# Patient Record
Sex: Male | Born: 1935 | Race: Black or African American | Hispanic: No | Marital: Married | Smoking: Former smoker
Health system: Southern US, Community
[De-identification: ages and names within clinical notes are randomized; demographics above are authoritative.]

## PROBLEM LIST (undated history)

## (undated) DIAGNOSIS — I2510 Atherosclerotic heart disease of native coronary artery without angina pectoris: Secondary | ICD-10-CM

## (undated) DIAGNOSIS — N186 End stage renal disease: Secondary | ICD-10-CM

## (undated) DIAGNOSIS — Z992 Dependence on renal dialysis: Secondary | ICD-10-CM

## (undated) DIAGNOSIS — K219 Gastro-esophageal reflux disease without esophagitis: Secondary | ICD-10-CM

## (undated) DIAGNOSIS — I10 Essential (primary) hypertension: Secondary | ICD-10-CM

## (undated) DIAGNOSIS — E119 Type 2 diabetes mellitus without complications: Secondary | ICD-10-CM

## (undated) DIAGNOSIS — I739 Peripheral vascular disease, unspecified: Secondary | ICD-10-CM

## (undated) DIAGNOSIS — I2129 ST elevation (STEMI) myocardial infarction involving other sites: Secondary | ICD-10-CM

## (undated) HISTORY — PX: OTHER SURGICAL HISTORY: SHX169

---

## 2007-04-14 ENCOUNTER — Ambulatory Visit: Payer: Self-pay | Admitting: Nephrology

## 2007-04-17 ENCOUNTER — Ambulatory Visit: Payer: Self-pay | Admitting: Nephrology

## 2007-08-22 ENCOUNTER — Ambulatory Visit: Payer: Self-pay

## 2008-05-10 ENCOUNTER — Emergency Department: Payer: Self-pay | Admitting: Emergency Medicine

## 2008-05-10 ENCOUNTER — Other Ambulatory Visit: Payer: Self-pay

## 2008-07-05 ENCOUNTER — Emergency Department: Payer: Self-pay | Admitting: Emergency Medicine

## 2008-12-17 ENCOUNTER — Emergency Department: Payer: Self-pay | Admitting: Emergency Medicine

## 2009-08-04 ENCOUNTER — Ambulatory Visit: Payer: Self-pay | Admitting: Otolaryngology

## 2010-01-22 ENCOUNTER — Ambulatory Visit: Payer: Self-pay | Admitting: Vascular Surgery

## 2010-02-03 ENCOUNTER — Ambulatory Visit: Payer: Self-pay | Admitting: Vascular Surgery

## 2010-03-19 ENCOUNTER — Ambulatory Visit: Payer: Self-pay | Admitting: Vascular Surgery

## 2010-06-04 ENCOUNTER — Ambulatory Visit: Payer: Self-pay | Admitting: Vascular Surgery

## 2010-07-28 ENCOUNTER — Ambulatory Visit: Payer: Self-pay | Admitting: Vascular Surgery

## 2010-08-31 ENCOUNTER — Emergency Department: Payer: Self-pay | Admitting: Emergency Medicine

## 2010-09-15 ENCOUNTER — Ambulatory Visit: Payer: Self-pay | Admitting: Vascular Surgery

## 2010-10-13 ENCOUNTER — Ambulatory Visit: Payer: Self-pay | Admitting: Vascular Surgery

## 2011-01-26 ENCOUNTER — Ambulatory Visit: Payer: Self-pay | Admitting: Vascular Surgery

## 2011-02-09 ENCOUNTER — Ambulatory Visit: Payer: Self-pay | Admitting: Vascular Surgery

## 2011-04-07 ENCOUNTER — Emergency Department: Payer: Self-pay | Admitting: Emergency Medicine

## 2011-04-13 ENCOUNTER — Emergency Department: Payer: Self-pay | Admitting: Emergency Medicine

## 2011-05-08 ENCOUNTER — Inpatient Hospital Stay: Payer: Self-pay | Admitting: Internal Medicine

## 2011-05-28 ENCOUNTER — Emergency Department: Payer: Self-pay | Admitting: *Deleted

## 2011-09-03 ENCOUNTER — Emergency Department: Payer: Self-pay | Admitting: *Deleted

## 2011-09-03 LAB — COMPREHENSIVE METABOLIC PANEL
Albumin: 3 g/dL — ABNORMAL LOW (ref 3.4–5.0)
Alkaline Phosphatase: 95 U/L (ref 50–136)
Anion Gap: 8 (ref 7–16)
Bilirubin,Total: 0.3 mg/dL (ref 0.2–1.0)
Calcium, Total: 8.9 mg/dL (ref 8.5–10.1)
Chloride: 98 mmol/L (ref 98–107)
Co2: 33 mmol/L — ABNORMAL HIGH (ref 21–32)
Creatinine: 4.71 mg/dL — ABNORMAL HIGH (ref 0.60–1.30)
EGFR (African American): 16 — ABNORMAL LOW
Glucose: 98 mg/dL (ref 65–99)
Osmolality: 279 (ref 275–301)
Potassium: 4 mmol/L (ref 3.5–5.1)
SGOT(AST): 18 U/L (ref 15–37)
Sodium: 139 mmol/L (ref 136–145)

## 2011-09-03 LAB — CBC WITH DIFFERENTIAL/PLATELET
Basophil #: 0 10*3/uL (ref 0.0–0.1)
Eosinophil #: 0.5 10*3/uL (ref 0.0–0.7)
Lymphocyte #: 0.9 10*3/uL — ABNORMAL LOW (ref 1.0–3.6)
Lymphocyte %: 13.8 %
MCH: 30.1 pg (ref 26.0–34.0)
MCHC: 32.1 g/dL (ref 32.0–36.0)
MCV: 94 fL (ref 80–100)
Monocyte #: 1.1 10*3/uL — ABNORMAL HIGH (ref 0.0–0.7)
Neutrophil #: 4.1 10*3/uL (ref 1.4–6.5)
Neutrophil %: 62.1 %
Platelet: 182 10*3/uL (ref 150–440)
RBC: 3.34 10*6/uL — ABNORMAL LOW (ref 4.40–5.90)
RDW: 16.1 % — ABNORMAL HIGH (ref 11.5–14.5)

## 2011-10-25 ENCOUNTER — Emergency Department: Payer: Self-pay | Admitting: Emergency Medicine

## 2011-10-25 LAB — CBC
HCT: 40.1 % (ref 40.0–52.0)
HGB: 12.7 g/dL — ABNORMAL LOW (ref 13.0–18.0)
MCHC: 31.7 g/dL — ABNORMAL LOW (ref 32.0–36.0)
MCV: 93 fL (ref 80–100)
RBC: 4.33 10*6/uL — ABNORMAL LOW (ref 4.40–5.90)
WBC: 5 10*3/uL (ref 3.8–10.6)

## 2011-10-25 LAB — CK TOTAL AND CKMB (NOT AT ARMC)
CK, Total: 207 U/L (ref 35–232)
CK-MB: 1.5 ng/mL (ref 0.5–3.6)

## 2011-10-25 LAB — BASIC METABOLIC PANEL
Calcium, Total: 9.4 mg/dL (ref 8.5–10.1)
Chloride: 98 mmol/L (ref 98–107)
Co2: 36 mmol/L — ABNORMAL HIGH (ref 21–32)
EGFR (African American): 12 — ABNORMAL LOW
Osmolality: 282 (ref 275–301)
Potassium: 4.4 mmol/L (ref 3.5–5.1)
Sodium: 140 mmol/L (ref 136–145)

## 2011-10-25 LAB — TROPONIN I: Troponin-I: 0.09 ng/mL — ABNORMAL HIGH

## 2011-10-28 LAB — CULTURE, BLOOD (SINGLE)

## 2011-11-16 ENCOUNTER — Ambulatory Visit: Payer: Self-pay | Admitting: Vascular Surgery

## 2011-11-16 LAB — POTASSIUM: Potassium: 4.2 mmol/L (ref 3.5–5.1)

## 2012-02-16 ENCOUNTER — Ambulatory Visit: Payer: Self-pay | Admitting: Ophthalmology

## 2012-02-16 DIAGNOSIS — I10 Essential (primary) hypertension: Secondary | ICD-10-CM

## 2012-02-16 LAB — POTASSIUM: Potassium: 4.1 mmol/L (ref 3.5–5.1)

## 2012-02-29 ENCOUNTER — Ambulatory Visit: Payer: Self-pay | Admitting: Ophthalmology

## 2012-02-29 LAB — POTASSIUM: Potassium: 4.6 mmol/L (ref 3.5–5.1)

## 2012-03-28 ENCOUNTER — Ambulatory Visit: Payer: Self-pay | Admitting: Vascular Surgery

## 2012-06-27 ENCOUNTER — Ambulatory Visit: Payer: Self-pay | Admitting: Vascular Surgery

## 2012-06-27 LAB — POTASSIUM: Potassium: 4.6 mmol/L (ref 3.5–5.1)

## 2012-07-03 ENCOUNTER — Emergency Department: Payer: Self-pay | Admitting: Emergency Medicine

## 2012-07-03 LAB — CK TOTAL AND CKMB (NOT AT ARMC)
CK, Total: 96 U/L (ref 35–232)
CK-MB: 1.1 ng/mL (ref 0.5–3.6)

## 2012-07-03 LAB — BASIC METABOLIC PANEL
Anion Gap: 11 (ref 7–16)
BUN: 49 mg/dL — ABNORMAL HIGH (ref 7–18)
Co2: 29 mmol/L (ref 21–32)
EGFR (Non-African Amer.): 6 — ABNORMAL LOW
Glucose: 125 mg/dL — ABNORMAL HIGH (ref 65–99)
Osmolality: 292 (ref 275–301)
Potassium: 4.2 mmol/L (ref 3.5–5.1)

## 2012-07-03 LAB — CBC
HGB: 11.1 g/dL — ABNORMAL LOW (ref 13.0–18.0)
MCH: 31.1 pg (ref 26.0–34.0)
MCHC: 33.9 g/dL (ref 32.0–36.0)
MCV: 92 fL (ref 80–100)
RDW: 15.1 % — ABNORMAL HIGH (ref 11.5–14.5)
WBC: 6.2 10*3/uL (ref 3.8–10.6)

## 2012-07-03 LAB — TROPONIN I: Troponin-I: <0.02 ng/mL

## 2012-11-10 ENCOUNTER — Emergency Department: Payer: Self-pay | Admitting: Emergency Medicine

## 2012-11-10 LAB — MAGNESIUM: Magnesium: 2.4 mg/dL

## 2012-11-10 LAB — BASIC METABOLIC PANEL
Anion Gap: 10 (ref 7–16)
BUN: 61 mg/dL — ABNORMAL HIGH (ref 7–18)
Calcium, Total: 8.7 mg/dL (ref 8.5–10.1)
Creatinine: 9.79 mg/dL — ABNORMAL HIGH (ref 0.60–1.30)
EGFR (African American): 5 — ABNORMAL LOW
EGFR (Non-African Amer.): 5 — ABNORMAL LOW
Glucose: 126 mg/dL — ABNORMAL HIGH (ref 65–99)
Osmolality: 287 (ref 275–301)
Potassium: 4.5 mmol/L (ref 3.5–5.1)

## 2013-01-16 ENCOUNTER — Encounter: Payer: Self-pay | Admitting: Neurology

## 2013-01-28 ENCOUNTER — Encounter: Payer: Self-pay | Admitting: Neurology

## 2013-04-10 ENCOUNTER — Ambulatory Visit: Payer: Self-pay | Admitting: Gastroenterology

## 2013-06-08 ENCOUNTER — Ambulatory Visit: Payer: Self-pay | Admitting: Unknown Physician Specialty

## 2013-06-13 ENCOUNTER — Ambulatory Visit: Payer: Self-pay | Admitting: Otolaryngology

## 2013-07-24 ENCOUNTER — Ambulatory Visit: Payer: Self-pay | Admitting: Vascular Surgery

## 2013-09-02 ENCOUNTER — Emergency Department: Payer: Self-pay | Admitting: Emergency Medicine

## 2013-09-02 LAB — COMPREHENSIVE METABOLIC PANEL
ALBUMIN: 2.9 g/dL — AB (ref 3.4–5.0)
ALK PHOS: 111 U/L
ANION GAP: 10 (ref 7–16)
BUN: 56 mg/dL — ABNORMAL HIGH (ref 7–18)
Bilirubin,Total: 0.3 mg/dL (ref 0.2–1.0)
Calcium, Total: 10.5 mg/dL — ABNORMAL HIGH (ref 8.5–10.1)
Chloride: 94 mmol/L — ABNORMAL LOW (ref 98–107)
Co2: 28 mmol/L (ref 21–32)
Creatinine: 9.57 mg/dL — ABNORMAL HIGH (ref 0.60–1.30)
EGFR (African American): 5 — ABNORMAL LOW
GFR CALC NON AF AMER: 5 — AB
GLUCOSE: 244 mg/dL — AB (ref 65–99)
Osmolality: 288 (ref 275–301)
Potassium: 5.2 mmol/L — ABNORMAL HIGH (ref 3.5–5.1)
SGOT(AST): 155 U/L — ABNORMAL HIGH (ref 15–37)
SGPT (ALT): 42 U/L (ref 12–78)
SODIUM: 132 mmol/L — AB (ref 136–145)
Total Protein: 7.4 g/dL (ref 6.4–8.2)

## 2013-09-02 LAB — CBC
HCT: 32.6 % — ABNORMAL LOW (ref 40.0–52.0)
HGB: 10.5 g/dL — AB (ref 13.0–18.0)
MCH: 28.5 pg (ref 26.0–34.0)
MCHC: 32.3 g/dL (ref 32.0–36.0)
MCV: 88 fL (ref 80–100)
Platelet: 237 10*3/uL (ref 150–440)
RBC: 3.69 10*6/uL — ABNORMAL LOW (ref 4.40–5.90)
RDW: 17.6 % — ABNORMAL HIGH (ref 11.5–14.5)
WBC: 10.1 10*3/uL (ref 3.8–10.6)

## 2013-09-02 LAB — TROPONIN I: TROPONIN-I: 0.07 ng/mL — AB

## 2013-09-03 ENCOUNTER — Inpatient Hospital Stay: Payer: Self-pay | Admitting: Internal Medicine

## 2013-09-03 LAB — CBC
HCT: 32.3 % — ABNORMAL LOW (ref 40.0–52.0)
HGB: 10.3 g/dL — AB (ref 13.0–18.0)
MCH: 28.1 pg (ref 26.0–34.0)
MCHC: 31.9 g/dL — ABNORMAL LOW (ref 32.0–36.0)
MCV: 88 fL (ref 80–100)
Platelet: 238 10*3/uL (ref 150–440)
RBC: 3.67 10*6/uL — AB (ref 4.40–5.90)
RDW: 17.7 % — AB (ref 11.5–14.5)
WBC: 10.6 10*3/uL (ref 3.8–10.6)

## 2013-09-03 LAB — COMPREHENSIVE METABOLIC PANEL
ALK PHOS: 100 U/L
ALT: 45 U/L (ref 12–78)
Albumin: 2.7 g/dL — ABNORMAL LOW (ref 3.4–5.0)
Anion Gap: 11 (ref 7–16)
BUN: 63 mg/dL — ABNORMAL HIGH (ref 7–18)
Bilirubin,Total: 0.3 mg/dL (ref 0.2–1.0)
CHLORIDE: 94 mmol/L — AB (ref 98–107)
Calcium, Total: 9.6 mg/dL (ref 8.5–10.1)
Co2: 25 mmol/L (ref 21–32)
Creatinine: 10.09 mg/dL — ABNORMAL HIGH (ref 0.60–1.30)
EGFR (African American): 5 — ABNORMAL LOW
GFR CALC NON AF AMER: 4 — AB
Glucose: 197 mg/dL — ABNORMAL HIGH (ref 65–99)
Osmolality: 284 (ref 275–301)
POTASSIUM: 5.6 mmol/L — AB (ref 3.5–5.1)
SGOT(AST): 145 U/L — ABNORMAL HIGH (ref 15–37)
Sodium: 130 mmol/L — ABNORMAL LOW (ref 136–145)
Total Protein: 7.5 g/dL (ref 6.4–8.2)

## 2013-09-03 LAB — TROPONIN I
Troponin-I: 0.08 ng/mL — ABNORMAL HIGH
Troponin-I: 0.07 ng/mL — ABNORMAL HIGH

## 2013-09-03 LAB — CK TOTAL AND CKMB (NOT AT ARMC)
CK, Total: 3717 U/L — ABNORMAL HIGH (ref 35–232)
CK-MB: 6.2 ng/mL — ABNORMAL HIGH (ref 0.5–3.6)

## 2013-09-03 LAB — PHOSPHORUS: PHOSPHORUS: 6.7 mg/dL — AB (ref 2.5–4.9)

## 2013-09-04 LAB — CK: CK, TOTAL: 976 U/L — AB (ref 35–232)

## 2013-09-04 LAB — SEDIMENTATION RATE: ERYTHROCYTE SED RATE: 126 mm/h — AB (ref 0–20)

## 2013-09-04 LAB — TSH: THYROID STIMULATING HORM: 1.7 u[IU]/mL

## 2013-09-04 LAB — AMMONIA: Ammonia, Plasma: 26 mcmol/L (ref 11–32)

## 2013-09-05 LAB — CBC WITH DIFFERENTIAL/PLATELET
Basophil #: 0 10*3/uL (ref 0.0–0.1)
Basophil %: 0.2 %
EOS ABS: 0 10*3/uL (ref 0.0–0.7)
EOS PCT: 0.1 %
HCT: 28.9 % — ABNORMAL LOW (ref 40.0–52.0)
HGB: 9.3 g/dL — ABNORMAL LOW (ref 13.0–18.0)
LYMPHS ABS: 0.9 10*3/uL — AB (ref 1.0–3.6)
LYMPHS PCT: 4.8 %
MCH: 28.1 pg (ref 26.0–34.0)
MCHC: 32.1 g/dL (ref 32.0–36.0)
MCV: 88 fL (ref 80–100)
MONOS PCT: 8.1 %
Monocyte #: 1.6 x10 3/mm — ABNORMAL HIGH (ref 0.2–1.0)
Neutrophil #: 17.1 10*3/uL — ABNORMAL HIGH (ref 1.4–6.5)
Neutrophil %: 86.8 %
PLATELETS: 224 10*3/uL (ref 150–440)
RBC: 3.3 10*6/uL — ABNORMAL LOW (ref 4.40–5.90)
RDW: 17.5 % — ABNORMAL HIGH (ref 11.5–14.5)
WBC: 19.7 10*3/uL — ABNORMAL HIGH (ref 3.8–10.6)

## 2013-09-05 LAB — BASIC METABOLIC PANEL
Anion Gap: 7 (ref 7–16)
BUN: 50 mg/dL — ABNORMAL HIGH (ref 7–18)
CO2: 31 mmol/L (ref 21–32)
CREATININE: 9.71 mg/dL — AB (ref 0.60–1.30)
Calcium, Total: 9.7 mg/dL (ref 8.5–10.1)
Chloride: 96 mmol/L — ABNORMAL LOW (ref 98–107)
EGFR (African American): 5 — ABNORMAL LOW
EGFR (Non-African Amer.): 5 — ABNORMAL LOW
Glucose: 101 mg/dL — ABNORMAL HIGH (ref 65–99)
Osmolality: 282 (ref 275–301)
POTASSIUM: 6.2 mmol/L — AB (ref 3.5–5.1)
SODIUM: 134 mmol/L — AB (ref 136–145)

## 2013-09-05 LAB — AMMONIA: Ammonia, Plasma: 50 mcmol/L — ABNORMAL HIGH (ref 11–32)

## 2013-09-05 LAB — PHOSPHORUS: PHOSPHORUS: 6.5 mg/dL — AB (ref 2.5–4.9)

## 2013-09-06 LAB — CBC WITH DIFFERENTIAL/PLATELET
BASOS ABS: 0.1 10*3/uL (ref 0.0–0.1)
Basophil %: 0.3 %
EOS PCT: 0.1 %
Eosinophil #: 0 10*3/uL (ref 0.0–0.7)
HCT: 30.9 % — ABNORMAL LOW (ref 40.0–52.0)
HGB: 9.8 g/dL — AB (ref 13.0–18.0)
Lymphocyte #: 0.9 10*3/uL — ABNORMAL LOW (ref 1.0–3.6)
Lymphocyte %: 4.5 %
MCH: 28.2 pg (ref 26.0–34.0)
MCHC: 31.8 g/dL — ABNORMAL LOW (ref 32.0–36.0)
MCV: 89 fL (ref 80–100)
Monocyte #: 1.4 x10 3/mm — ABNORMAL HIGH (ref 0.2–1.0)
Monocyte %: 7.3 %
Neutrophil #: 17.3 10*3/uL — ABNORMAL HIGH (ref 1.4–6.5)
Neutrophil %: 87.8 %
Platelet: 244 10*3/uL (ref 150–440)
RBC: 3.49 10*6/uL — ABNORMAL LOW (ref 4.40–5.90)
RDW: 17.7 % — ABNORMAL HIGH (ref 11.5–14.5)
WBC: 19.7 10*3/uL — AB (ref 3.8–10.6)

## 2013-09-06 LAB — BASIC METABOLIC PANEL
Anion Gap: 11 (ref 7–16)
BUN: 35 mg/dL — AB (ref 7–18)
CHLORIDE: 92 mmol/L — AB (ref 98–107)
CO2: 28 mmol/L (ref 21–32)
Calcium, Total: 10.2 mg/dL — ABNORMAL HIGH (ref 8.5–10.1)
Creatinine: 6.77 mg/dL — ABNORMAL HIGH (ref 0.60–1.30)
EGFR (Non-African Amer.): 7 — ABNORMAL LOW
GFR CALC AF AMER: 8 — AB
Glucose: 138 mg/dL — ABNORMAL HIGH (ref 65–99)
Osmolality: 273 (ref 275–301)
POTASSIUM: 5.4 mmol/L — AB (ref 3.5–5.1)
Sodium: 131 mmol/L — ABNORMAL LOW (ref 136–145)

## 2013-09-06 LAB — PHOSPHORUS: Phosphorus: 8.1 mg/dL — ABNORMAL HIGH (ref 2.5–4.9)

## 2013-09-07 LAB — CBC WITH DIFFERENTIAL/PLATELET
BASOS PCT: 0.4 %
Basophil #: 0.1 10*3/uL (ref 0.0–0.1)
Eosinophil #: 0.3 10*3/uL (ref 0.0–0.7)
Eosinophil %: 1.5 %
HCT: 27.2 % — AB (ref 40.0–52.0)
HGB: 8.6 g/dL — AB (ref 13.0–18.0)
LYMPHS PCT: 4.8 %
Lymphocyte #: 0.9 10*3/uL — ABNORMAL LOW (ref 1.0–3.6)
MCH: 28.3 pg (ref 26.0–34.0)
MCHC: 31.7 g/dL — ABNORMAL LOW (ref 32.0–36.0)
MCV: 89 fL (ref 80–100)
MONO ABS: 1.6 x10 3/mm — AB (ref 0.2–1.0)
MONOS PCT: 9 %
Neutrophil #: 15 10*3/uL — ABNORMAL HIGH (ref 1.4–6.5)
Neutrophil %: 84.3 %
PLATELETS: 240 10*3/uL (ref 150–440)
RBC: 3.04 10*6/uL — AB (ref 4.40–5.90)
RDW: 18 % — AB (ref 11.5–14.5)
WBC: 17.8 10*3/uL — AB (ref 3.8–10.6)

## 2013-09-07 LAB — BASIC METABOLIC PANEL
Anion Gap: 12 (ref 7–16)
BUN: 32 mg/dL — AB (ref 7–18)
CREATININE: 5 mg/dL — AB (ref 0.60–1.30)
Calcium, Total: 10.5 mg/dL — ABNORMAL HIGH (ref 8.5–10.1)
Chloride: 95 mmol/L — ABNORMAL LOW (ref 98–107)
Co2: 27 mmol/L (ref 21–32)
EGFR (African American): 12 — ABNORMAL LOW
EGFR (Non-African Amer.): 10 — ABNORMAL LOW
Glucose: 190 mg/dL — ABNORMAL HIGH (ref 65–99)
Osmolality: 280 (ref 275–301)
POTASSIUM: 4.5 mmol/L (ref 3.5–5.1)
Sodium: 134 mmol/L — ABNORMAL LOW (ref 136–145)

## 2013-09-07 LAB — VANCOMYCIN, TROUGH: Vancomycin, Trough: 26 ug/mL (ref 10–20)

## 2013-09-07 LAB — PHOSPHORUS: PHOSPHORUS: 6.2 mg/dL — AB (ref 2.5–4.9)

## 2013-09-08 LAB — BASIC METABOLIC PANEL
Anion Gap: 16 (ref 7–16)
BUN: 32 mg/dL — ABNORMAL HIGH (ref 7–18)
Calcium, Total: 10.4 mg/dL — ABNORMAL HIGH (ref 8.5–10.1)
Chloride: 97 mmol/L — ABNORMAL LOW (ref 98–107)
Co2: 26 mmol/L (ref 21–32)
Creatinine: 3.95 mg/dL — ABNORMAL HIGH (ref 0.60–1.30)
EGFR (African American): 16 — ABNORMAL LOW
EGFR (Non-African Amer.): 14 — ABNORMAL LOW
Glucose: 216 mg/dL — ABNORMAL HIGH (ref 65–99)
Osmolality: 291 (ref 275–301)
POTASSIUM: 4.1 mmol/L (ref 3.5–5.1)
Sodium: 139 mmol/L (ref 136–145)

## 2013-09-08 LAB — CBC WITH DIFFERENTIAL/PLATELET
BASOS ABS: 0.1 10*3/uL (ref 0.0–0.1)
Basophil %: 0.3 %
EOS PCT: 0.6 %
Eosinophil #: 0.1 10*3/uL (ref 0.0–0.7)
HCT: 27.2 % — AB (ref 40.0–52.0)
HGB: 8.7 g/dL — AB (ref 13.0–18.0)
Lymphocyte #: 1 10*3/uL (ref 1.0–3.6)
Lymphocyte %: 5.4 %
MCH: 28.2 pg (ref 26.0–34.0)
MCHC: 31.9 g/dL — ABNORMAL LOW (ref 32.0–36.0)
MCV: 88 fL (ref 80–100)
Monocyte #: 1.7 x10 3/mm — ABNORMAL HIGH (ref 0.2–1.0)
Monocyte %: 9.2 %
NEUTROS ABS: 15.8 10*3/uL — AB (ref 1.4–6.5)
NEUTROS PCT: 84.5 %
Platelet: 262 10*3/uL (ref 150–440)
RBC: 3.08 10*6/uL — AB (ref 4.40–5.90)
RDW: 17.9 % — ABNORMAL HIGH (ref 11.5–14.5)
WBC: 18.7 10*3/uL — ABNORMAL HIGH (ref 3.8–10.6)

## 2013-09-08 LAB — CULTURE, BLOOD (SINGLE)

## 2013-09-09 LAB — CBC WITH DIFFERENTIAL/PLATELET
Basophil #: 0.1 10*3/uL (ref 0.0–0.1)
Basophil %: 0.4 %
Eosinophil #: 0.1 10*3/uL (ref 0.0–0.7)
Eosinophil %: 1 %
HCT: 28.5 % — ABNORMAL LOW (ref 40.0–52.0)
HGB: 9 g/dL — ABNORMAL LOW (ref 13.0–18.0)
Lymphocyte #: 1 10*3/uL (ref 1.0–3.6)
Lymphocyte %: 7 %
MCH: 27.7 pg (ref 26.0–34.0)
MCHC: 31.7 g/dL — ABNORMAL LOW (ref 32.0–36.0)
MCV: 88 fL (ref 80–100)
Monocyte #: 1.5 x10 3/mm — ABNORMAL HIGH (ref 0.2–1.0)
Monocyte %: 10.7 %
Neutrophil #: 11.4 10*3/uL — ABNORMAL HIGH (ref 1.4–6.5)
Neutrophil %: 80.9 %
Platelet: 286 10*3/uL (ref 150–440)
RBC: 3.26 10*6/uL — ABNORMAL LOW (ref 4.40–5.90)
RDW: 17.9 % — ABNORMAL HIGH (ref 11.5–14.5)
WBC: 14.1 10*3/uL — ABNORMAL HIGH (ref 3.8–10.6)

## 2013-09-10 LAB — CBC WITH DIFFERENTIAL/PLATELET
Basophil #: 0.1 10*3/uL (ref 0.0–0.1)
Basophil %: 0.7 %
Eosinophil #: 0.3 10*3/uL (ref 0.0–0.7)
Eosinophil %: 2.6 %
HCT: 28.9 % — ABNORMAL LOW (ref 40.0–52.0)
HGB: 9.4 g/dL — ABNORMAL LOW (ref 13.0–18.0)
LYMPHS PCT: 11.9 %
Lymphocyte #: 1.2 10*3/uL (ref 1.0–3.6)
MCH: 28.6 pg (ref 26.0–34.0)
MCHC: 32.6 g/dL (ref 32.0–36.0)
MCV: 88 fL (ref 80–100)
MONO ABS: 1.3 x10 3/mm — AB (ref 0.2–1.0)
Monocyte %: 12.6 %
NEUTROS PCT: 72.2 %
Neutrophil #: 7.5 10*3/uL — ABNORMAL HIGH (ref 1.4–6.5)
PLATELETS: 277 10*3/uL (ref 150–440)
RBC: 3.29 10*6/uL — ABNORMAL LOW (ref 4.40–5.90)
RDW: 17.6 % — ABNORMAL HIGH (ref 11.5–14.5)
WBC: 10.3 10*3/uL (ref 3.8–10.6)

## 2013-09-10 LAB — VANCOMYCIN, TROUGH: VANCOMYCIN, TROUGH: 23 ug/mL — AB (ref 10–20)

## 2013-09-10 LAB — PHOSPHORUS: Phosphorus: 5.2 mg/dL — ABNORMAL HIGH (ref 2.5–4.9)

## 2013-09-11 LAB — CBC WITH DIFFERENTIAL/PLATELET
Basophil #: 0.1 10*3/uL (ref 0.0–0.1)
Basophil %: 1 %
EOS PCT: 3 %
Eosinophil #: 0.3 10*3/uL (ref 0.0–0.7)
HCT: 28.5 % — AB (ref 40.0–52.0)
HGB: 9.2 g/dL — ABNORMAL LOW (ref 13.0–18.0)
LYMPHS ABS: 1.3 10*3/uL (ref 1.0–3.6)
LYMPHS PCT: 13.5 %
MCH: 28.3 pg (ref 26.0–34.0)
MCHC: 32.1 g/dL (ref 32.0–36.0)
MCV: 88 fL (ref 80–100)
MONO ABS: 1.2 x10 3/mm — AB (ref 0.2–1.0)
MONOS PCT: 13.4 %
NEUTROS ABS: 6.5 10*3/uL (ref 1.4–6.5)
Neutrophil %: 69.1 %
Platelet: 256 10*3/uL (ref 150–440)
RBC: 3.24 10*6/uL — ABNORMAL LOW (ref 4.40–5.90)
RDW: 17.3 % — ABNORMAL HIGH (ref 11.5–14.5)
WBC: 9.4 10*3/uL (ref 3.8–10.6)

## 2013-09-11 LAB — BASIC METABOLIC PANEL
Anion Gap: 8 (ref 7–16)
BUN: 43 mg/dL — ABNORMAL HIGH (ref 7–18)
CHLORIDE: 102 mmol/L (ref 98–107)
CREATININE: 5.41 mg/dL — AB (ref 0.60–1.30)
Calcium, Total: 9.9 mg/dL (ref 8.5–10.1)
Co2: 31 mmol/L (ref 21–32)
EGFR (African American): 11 — ABNORMAL LOW
GFR CALC NON AF AMER: 9 — AB
GLUCOSE: 235 mg/dL — AB (ref 65–99)
OSMOLALITY: 300 (ref 275–301)
POTASSIUM: 4.3 mmol/L (ref 3.5–5.1)
SODIUM: 141 mmol/L (ref 136–145)

## 2013-09-11 LAB — CULTURE, BLOOD (SINGLE)

## 2013-09-12 LAB — CULTURE, BLOOD (SINGLE)

## 2013-09-12 LAB — CK: CK, TOTAL: 777 U/L — AB (ref 35–232)

## 2013-09-13 LAB — CBC WITH DIFFERENTIAL/PLATELET
Bands: 3 %
Eosinophil: 5 %
HCT: 29.9 % — AB (ref 40.0–52.0)
HGB: 9.7 g/dL — ABNORMAL LOW (ref 13.0–18.0)
LYMPHS PCT: 10 %
MCH: 28.7 pg (ref 26.0–34.0)
MCHC: 32.5 g/dL (ref 32.0–36.0)
MCV: 88 fL (ref 80–100)
Metamyelocyte: 1 %
Monocytes: 8 %
Platelet: 252 10*3/uL (ref 150–440)
RBC: 3.39 10*6/uL — AB (ref 4.40–5.90)
RDW: 17.5 % — ABNORMAL HIGH (ref 11.5–14.5)
Segmented Neutrophils: 73 %
WBC: 9.1 10*3/uL (ref 3.8–10.6)

## 2013-09-13 LAB — BASIC METABOLIC PANEL
Anion Gap: 7 (ref 7–16)
BUN: 44 mg/dL — ABNORMAL HIGH (ref 7–18)
CO2: 32 mmol/L (ref 21–32)
Calcium, Total: 10.2 mg/dL — ABNORMAL HIGH (ref 8.5–10.1)
Chloride: 98 mmol/L (ref 98–107)
Creatinine: 5.76 mg/dL — ABNORMAL HIGH (ref 0.60–1.30)
EGFR (Non-African Amer.): 9 — ABNORMAL LOW
GFR CALC AF AMER: 10 — AB
Glucose: 339 mg/dL — ABNORMAL HIGH (ref 65–99)
Osmolality: 298 (ref 275–301)
Potassium: 4 mmol/L (ref 3.5–5.1)
Sodium: 137 mmol/L (ref 136–145)

## 2013-09-28 ENCOUNTER — Other Ambulatory Visit: Payer: Self-pay | Admitting: Nephrology

## 2013-09-28 LAB — CBC WITH DIFFERENTIAL/PLATELET
Basophil #: 0 10*3/uL (ref 0.0–0.1)
Basophil %: 0.6 %
Eosinophil #: 0.3 10*3/uL (ref 0.0–0.7)
Eosinophil %: 4.6 %
HCT: 20.6 % — ABNORMAL LOW (ref 40.0–52.0)
HGB: 6.7 g/dL — AB (ref 13.0–18.0)
LYMPHS ABS: 1.6 10*3/uL (ref 1.0–3.6)
Lymphocyte %: 24.7 %
MCH: 28.6 pg (ref 26.0–34.0)
MCHC: 32.6 g/dL (ref 32.0–36.0)
MCV: 88 fL (ref 80–100)
MONO ABS: 0.8 x10 3/mm (ref 0.2–1.0)
Monocyte %: 13 %
NEUTROS PCT: 57.1 %
Neutrophil #: 3.7 10*3/uL (ref 1.4–6.5)
Platelet: 291 10*3/uL (ref 150–440)
RBC: 2.34 10*6/uL — ABNORMAL LOW (ref 4.40–5.90)
RDW: 17.2 % — ABNORMAL HIGH (ref 11.5–14.5)
WBC: 6.4 10*3/uL (ref 3.8–10.6)

## 2013-10-04 ENCOUNTER — Inpatient Hospital Stay: Payer: Self-pay | Admitting: Internal Medicine

## 2013-10-04 LAB — BASIC METABOLIC PANEL
Anion Gap: 4 — ABNORMAL LOW (ref 7–16)
BUN: 19 mg/dL — AB (ref 7–18)
CALCIUM: 9 mg/dL (ref 8.5–10.1)
CREATININE: 4.34 mg/dL — AB (ref 0.60–1.30)
Chloride: 97 mmol/L — ABNORMAL LOW (ref 98–107)
Co2: 34 mmol/L — ABNORMAL HIGH (ref 21–32)
GFR CALC AF AMER: 14 — AB
GFR CALC NON AF AMER: 12 — AB
GLUCOSE: 64 mg/dL — AB (ref 65–99)
OSMOLALITY: 270 (ref 275–301)
Potassium: 3.9 mmol/L (ref 3.5–5.1)
Sodium: 135 mmol/L — ABNORMAL LOW (ref 136–145)

## 2013-10-04 LAB — CBC WITH DIFFERENTIAL/PLATELET
BASOS PCT: 0.8 %
Basophil #: 0.1 10*3/uL (ref 0.0–0.1)
EOS ABS: 0.1 10*3/uL (ref 0.0–0.7)
EOS PCT: 0.8 %
HCT: 18.2 % — ABNORMAL LOW (ref 40.0–52.0)
HGB: 5.9 g/dL — ABNORMAL LOW (ref 13.0–18.0)
LYMPHS PCT: 16.3 %
Lymphocyte #: 1.3 10*3/uL (ref 1.0–3.6)
MCH: 29.7 pg (ref 26.0–34.0)
MCHC: 32.5 g/dL (ref 32.0–36.0)
MCV: 92 fL (ref 80–100)
MONO ABS: 0.8 x10 3/mm (ref 0.2–1.0)
Monocyte %: 10.4 %
NEUTROS ABS: 5.7 10*3/uL (ref 1.4–6.5)
NEUTROS PCT: 71.7 %
PLATELETS: 178 10*3/uL (ref 150–440)
RBC: 1.99 10*6/uL — AB (ref 4.40–5.90)
RDW: 18.2 % — AB (ref 11.5–14.5)
WBC: 8 10*3/uL (ref 3.8–10.6)

## 2013-10-05 LAB — CBC WITH DIFFERENTIAL/PLATELET
BASOS ABS: 0 10*3/uL (ref 0.0–0.1)
Basophil %: 0.1 %
EOS PCT: 0 %
Eosinophil #: 0 10*3/uL (ref 0.0–0.7)
HCT: 24.5 % — ABNORMAL LOW (ref 40.0–52.0)
HGB: 8 g/dL — ABNORMAL LOW (ref 13.0–18.0)
Lymphocyte #: 0.8 10*3/uL — ABNORMAL LOW (ref 1.0–3.6)
Lymphocyte %: 8.2 %
MCH: 30.1 pg (ref 26.0–34.0)
MCHC: 32.9 g/dL (ref 32.0–36.0)
MCV: 92 fL (ref 80–100)
MONO ABS: 0.4 x10 3/mm (ref 0.2–1.0)
MONOS PCT: 4.3 %
NEUTROS ABS: 8.1 10*3/uL — AB (ref 1.4–6.5)
NEUTROS PCT: 87.4 %
PLATELETS: 169 10*3/uL (ref 150–440)
RBC: 2.67 10*6/uL — ABNORMAL LOW (ref 4.40–5.90)
RDW: 18 % — AB (ref 11.5–14.5)
WBC: 9.2 10*3/uL (ref 3.8–10.6)

## 2013-10-05 LAB — BASIC METABOLIC PANEL
Anion Gap: 8 (ref 7–16)
BUN: 37 mg/dL — AB (ref 7–18)
Calcium, Total: 9.6 mg/dL (ref 8.5–10.1)
Chloride: 95 mmol/L — ABNORMAL LOW (ref 98–107)
Co2: 29 mmol/L (ref 21–32)
Creatinine: 5.75 mg/dL — ABNORMAL HIGH (ref 0.60–1.30)
EGFR (African American): 10 — ABNORMAL LOW
GFR CALC NON AF AMER: 9 — AB
GLUCOSE: 271 mg/dL — AB (ref 65–99)
OSMOLALITY: 283 (ref 275–301)
POTASSIUM: 4 mmol/L (ref 3.5–5.1)
Sodium: 132 mmol/L — ABNORMAL LOW (ref 136–145)

## 2013-10-05 LAB — PHOSPHORUS: Phosphorus: 5.6 mg/dL — ABNORMAL HIGH (ref 2.5–4.9)

## 2013-10-06 LAB — CBC WITH DIFFERENTIAL/PLATELET
BASOS ABS: 0 10*3/uL (ref 0.0–0.1)
Basophil %: 0.1 %
Eosinophil #: 0 10*3/uL (ref 0.0–0.7)
Eosinophil %: 0 %
HCT: 20.9 % — AB (ref 40.0–52.0)
HGB: 6.9 g/dL — AB (ref 13.0–18.0)
Lymphocyte #: 1.1 10*3/uL (ref 1.0–3.6)
Lymphocyte %: 12.5 %
MCH: 30.6 pg (ref 26.0–34.0)
MCHC: 33.3 g/dL (ref 32.0–36.0)
MCV: 92 fL (ref 80–100)
MONO ABS: 0.6 x10 3/mm (ref 0.2–1.0)
MONOS PCT: 7.2 %
NEUTROS ABS: 6.9 10*3/uL — AB (ref 1.4–6.5)
Neutrophil %: 80.2 %
Platelet: 155 10*3/uL (ref 150–440)
RBC: 2.27 10*6/uL — ABNORMAL LOW (ref 4.40–5.90)
RDW: 17.1 % — AB (ref 11.5–14.5)
WBC: 8.6 10*3/uL (ref 3.8–10.6)

## 2013-10-07 LAB — CBC WITH DIFFERENTIAL/PLATELET
Eosinophil: 2 %
HCT: 25.1 % — AB (ref 40.0–52.0)
HGB: 8 g/dL — ABNORMAL LOW (ref 13.0–18.0)
Lymphocytes: 13 %
MCH: 29.3 pg (ref 26.0–34.0)
MCHC: 31.9 g/dL — AB (ref 32.0–36.0)
MCV: 92 fL (ref 80–100)
MONOS PCT: 8 %
NRBC/100 WBC: 1 /
Platelet: 136 10*3/uL — ABNORMAL LOW (ref 150–440)
RBC: 2.73 10*6/uL — AB (ref 4.40–5.90)
RDW: 17.2 % — ABNORMAL HIGH (ref 11.5–14.5)
Segmented Neutrophils: 77 %
WBC: 8 10*3/uL (ref 3.8–10.6)

## 2013-10-08 LAB — CBC WITH DIFFERENTIAL/PLATELET
BASOS PCT: 0.5 %
Basophil #: 0 10*3/uL (ref 0.0–0.1)
Eosinophil #: 0.1 10*3/uL (ref 0.0–0.7)
Eosinophil %: 1.6 %
HCT: 26.2 % — ABNORMAL LOW (ref 40.0–52.0)
HGB: 8.4 g/dL — ABNORMAL LOW (ref 13.0–18.0)
LYMPHS ABS: 1.1 10*3/uL (ref 1.0–3.6)
Lymphocyte %: 14.8 %
MCH: 29.5 pg (ref 26.0–34.0)
MCHC: 32.1 g/dL (ref 32.0–36.0)
MCV: 92 fL (ref 80–100)
MONOS PCT: 8.4 %
Monocyte #: 0.6 x10 3/mm (ref 0.2–1.0)
NEUTROS PCT: 74.7 %
Neutrophil #: 5.8 10*3/uL (ref 1.4–6.5)
Platelet: 133 10*3/uL — ABNORMAL LOW (ref 150–440)
RBC: 2.84 10*6/uL — AB (ref 4.40–5.90)
RDW: 17.2 % — AB (ref 11.5–14.5)
WBC: 7.8 10*3/uL (ref 3.8–10.6)

## 2013-10-08 LAB — BASIC METABOLIC PANEL
ANION GAP: 4 — AB (ref 7–16)
BUN: 39 mg/dL — ABNORMAL HIGH (ref 7–18)
CO2: 31 mmol/L (ref 21–32)
CREATININE: 6.49 mg/dL — AB (ref 0.60–1.30)
Calcium, Total: 9.5 mg/dL (ref 8.5–10.1)
Chloride: 98 mmol/L (ref 98–107)
GFR CALC AF AMER: 9 — AB
GFR CALC NON AF AMER: 8 — AB
Glucose: 224 mg/dL — ABNORMAL HIGH (ref 65–99)
Osmolality: 283 (ref 275–301)
POTASSIUM: 3.9 mmol/L (ref 3.5–5.1)
Sodium: 133 mmol/L — ABNORMAL LOW (ref 136–145)

## 2013-10-08 LAB — PHOSPHORUS: Phosphorus: 2.5 mg/dL (ref 2.5–4.9)

## 2013-10-08 LAB — PATHOLOGY REPORT

## 2013-10-09 LAB — CULTURE, BLOOD (SINGLE)

## 2013-10-10 LAB — CULTURE, BLOOD (SINGLE)

## 2013-11-09 ENCOUNTER — Other Ambulatory Visit: Payer: Self-pay

## 2013-11-09 LAB — HEMOGLOBIN: HGB: 8.2 g/dL — ABNORMAL LOW (ref 13.0–18.0)

## 2013-11-09 LAB — HEMATOCRIT: HCT: 25.4 % — ABNORMAL LOW (ref 40.0–52.0)

## 2013-11-16 ENCOUNTER — Inpatient Hospital Stay: Payer: Self-pay | Admitting: Internal Medicine

## 2013-11-16 LAB — CBC
HCT: 29.3 % — ABNORMAL LOW (ref 40.0–52.0)
HGB: 9.2 g/dL — ABNORMAL LOW (ref 13.0–18.0)
MCH: 28.6 pg (ref 26.0–34.0)
MCHC: 31.5 g/dL — ABNORMAL LOW (ref 32.0–36.0)
MCV: 91 fL (ref 80–100)
PLATELETS: 229 10*3/uL (ref 150–440)
RBC: 3.22 10*6/uL — ABNORMAL LOW (ref 4.40–5.90)
RDW: 18.4 % — ABNORMAL HIGH (ref 11.5–14.5)
WBC: 6.8 10*3/uL (ref 3.8–10.6)

## 2013-11-16 LAB — CK-MB: CK-MB: 2.5 ng/mL (ref 0.5–3.6)

## 2013-11-16 LAB — BASIC METABOLIC PANEL
Anion Gap: 4 — ABNORMAL LOW (ref 7–16)
BUN: 10 mg/dL (ref 7–18)
CALCIUM: 8.6 mg/dL (ref 8.5–10.1)
CHLORIDE: 97 mmol/L — AB (ref 98–107)
Co2: 35 mmol/L — ABNORMAL HIGH (ref 21–32)
Creatinine: 3.24 mg/dL — ABNORMAL HIGH (ref 0.60–1.30)
EGFR (Non-African Amer.): 18 — ABNORMAL LOW
GFR CALC AF AMER: 20 — AB
GLUCOSE: 46 mg/dL — AB (ref 65–99)
OSMOLALITY: 268 (ref 275–301)
Potassium: 3.1 mmol/L — ABNORMAL LOW (ref 3.5–5.1)
Sodium: 136 mmol/L (ref 136–145)

## 2013-11-16 LAB — TROPONIN I: TROPONIN-I: 0.74 ng/mL — AB

## 2013-11-16 LAB — PRO B NATRIURETIC PEPTIDE: B-Type Natriuretic Peptide: 70431 pg/mL — ABNORMAL HIGH (ref 0–450)

## 2013-11-17 LAB — CBC WITH DIFFERENTIAL/PLATELET
BASOS ABS: 0 10*3/uL (ref 0.0–0.1)
Basophil %: 0.3 %
Eosinophil #: 0 10*3/uL (ref 0.0–0.7)
Eosinophil %: 0 %
HCT: 25.7 % — ABNORMAL LOW (ref 40.0–52.0)
HGB: 8 g/dL — ABNORMAL LOW (ref 13.0–18.0)
Lymphocyte #: 0.3 10*3/uL — ABNORMAL LOW (ref 1.0–3.6)
Lymphocyte %: 10.8 %
MCH: 28.2 pg (ref 26.0–34.0)
MCHC: 31.3 g/dL — ABNORMAL LOW (ref 32.0–36.0)
MCV: 90 fL (ref 80–100)
MONO ABS: 0.1 x10 3/mm — AB (ref 0.2–1.0)
Monocyte %: 4.8 %
NEUTROS ABS: 2.5 10*3/uL (ref 1.4–6.5)
Neutrophil %: 84.1 %
Platelet: 178 10*3/uL (ref 150–440)
RBC: 2.85 10*6/uL — ABNORMAL LOW (ref 4.40–5.90)
RDW: 18.7 % — ABNORMAL HIGH (ref 11.5–14.5)
WBC: 2.9 10*3/uL — ABNORMAL LOW (ref 3.8–10.6)

## 2013-11-17 LAB — BASIC METABOLIC PANEL
Anion Gap: 4 — ABNORMAL LOW (ref 7–16)
BUN: 13 mg/dL (ref 7–18)
Calcium, Total: 8.4 mg/dL — ABNORMAL LOW (ref 8.5–10.1)
Chloride: 97 mmol/L — ABNORMAL LOW (ref 98–107)
Co2: 33 mmol/L — ABNORMAL HIGH (ref 21–32)
Creatinine: 3.71 mg/dL — ABNORMAL HIGH (ref 0.60–1.30)
EGFR (African American): 17 — ABNORMAL LOW
GFR CALC NON AF AMER: 15 — AB
GLUCOSE: 123 mg/dL — AB (ref 65–99)
Osmolality: 270 (ref 275–301)
POTASSIUM: 3.5 mmol/L (ref 3.5–5.1)
Sodium: 134 mmol/L — ABNORMAL LOW (ref 136–145)

## 2013-11-17 LAB — TSH: Thyroid Stimulating Horm: 1.25 u[IU]/mL

## 2013-11-17 LAB — LIPID PANEL
Cholesterol: 52 mg/dL (ref 0–200)
HDL: 38 mg/dL — AB (ref 40–60)
LDL CHOLESTEROL, CALC: 5 mg/dL (ref 0–100)
TRIGLYCERIDES: 46 mg/dL (ref 0–200)
VLDL CHOLESTEROL, CALC: 9 mg/dL (ref 5–40)

## 2013-11-17 LAB — CK-MB
CK-MB: 1.5 ng/mL (ref 0.5–3.6)
CK-MB: 1.6 ng/mL (ref 0.5–3.6)

## 2013-11-17 LAB — HEMOGLOBIN A1C: HEMOGLOBIN A1C: 6 % (ref 4.2–6.3)

## 2013-11-19 LAB — CBC WITH DIFFERENTIAL/PLATELET
BASOS ABS: 0 10*3/uL (ref 0.0–0.1)
Basophil %: 0 %
EOS ABS: 0 10*3/uL (ref 0.0–0.7)
EOS PCT: 0 %
HCT: 25.4 % — ABNORMAL LOW (ref 40.0–52.0)
HGB: 7.9 g/dL — AB (ref 13.0–18.0)
LYMPHS PCT: 6.3 %
Lymphocyte #: 0.4 10*3/uL — ABNORMAL LOW (ref 1.0–3.6)
MCH: 28 pg (ref 26.0–34.0)
MCHC: 31.3 g/dL — ABNORMAL LOW (ref 32.0–36.0)
MCV: 90 fL (ref 80–100)
MONOS PCT: 4.5 %
Monocyte #: 0.3 x10 3/mm (ref 0.2–1.0)
NEUTROS ABS: 5.2 10*3/uL (ref 1.4–6.5)
NEUTROS PCT: 89.2 %
PLATELETS: 177 10*3/uL (ref 150–440)
RBC: 2.83 10*6/uL — AB (ref 4.40–5.90)
RDW: 18.3 % — ABNORMAL HIGH (ref 11.5–14.5)
WBC: 5.8 10*3/uL (ref 3.8–10.6)

## 2013-11-19 LAB — PHOSPHORUS: PHOSPHORUS: 3.8 mg/dL (ref 2.5–4.9)

## 2013-11-19 LAB — FERRITIN: Ferritin (ARMC): 1544 ng/mL — ABNORMAL HIGH (ref 8–388)

## 2013-11-19 LAB — BASIC METABOLIC PANEL
Anion Gap: 10 (ref 7–16)
BUN: 47 mg/dL — AB (ref 7–18)
CALCIUM: 8.7 mg/dL (ref 8.5–10.1)
CO2: 29 mmol/L (ref 21–32)
CREATININE: 6.4 mg/dL — AB (ref 0.60–1.30)
Chloride: 90 mmol/L — ABNORMAL LOW (ref 98–107)
EGFR (African American): 9 — ABNORMAL LOW
GFR CALC NON AF AMER: 8 — AB
Glucose: 314 mg/dL — ABNORMAL HIGH (ref 65–99)
OSMOLALITY: 283 (ref 275–301)
Potassium: 3.9 mmol/L (ref 3.5–5.1)
Sodium: 129 mmol/L — ABNORMAL LOW (ref 136–145)

## 2013-11-20 LAB — PLATELET COUNT: PLATELETS: 176 10*3/uL (ref 150–440)

## 2013-11-21 LAB — HEMOGLOBIN: HGB: 8.5 g/dL — AB (ref 13.0–18.0)

## 2013-11-21 LAB — CULTURE, BLOOD (SINGLE)

## 2013-11-21 LAB — POTASSIUM: POTASSIUM: 4.2 mmol/L (ref 3.5–5.1)

## 2013-11-21 LAB — PHOSPHORUS: Phosphorus: 2.6 mg/dL (ref 2.5–4.9)

## 2013-11-22 LAB — CULTURE, BLOOD (SINGLE)

## 2013-12-24 ENCOUNTER — Inpatient Hospital Stay: Payer: Self-pay | Admitting: Internal Medicine

## 2013-12-24 LAB — CBC
HCT: 36.4 % — ABNORMAL LOW (ref 40.0–52.0)
HGB: 11.2 g/dL — ABNORMAL LOW (ref 13.0–18.0)
MCH: 28.1 pg (ref 26.0–34.0)
MCHC: 30.7 g/dL — ABNORMAL LOW (ref 32.0–36.0)
MCV: 92 fL (ref 80–100)
PLATELETS: 291 10*3/uL (ref 150–440)
RBC: 3.97 10*6/uL — AB (ref 4.40–5.90)
RDW: 21.6 % — ABNORMAL HIGH (ref 11.5–14.5)
WBC: 15.2 10*3/uL — AB (ref 3.8–10.6)

## 2013-12-24 LAB — COMPREHENSIVE METABOLIC PANEL
AST: 13 U/L — AB (ref 15–37)
Albumin: 2.6 g/dL — ABNORMAL LOW (ref 3.4–5.0)
Alkaline Phosphatase: 147 U/L — ABNORMAL HIGH
Anion Gap: 8 (ref 7–16)
BILIRUBIN TOTAL: 0.5 mg/dL (ref 0.2–1.0)
BUN: 22 mg/dL — AB (ref 7–18)
CREATININE: 5.74 mg/dL — AB (ref 0.60–1.30)
Calcium, Total: 9.1 mg/dL (ref 8.5–10.1)
Chloride: 95 mmol/L — ABNORMAL LOW (ref 98–107)
Co2: 33 mmol/L — ABNORMAL HIGH (ref 21–32)
GFR CALC AF AMER: 10 — AB
GFR CALC NON AF AMER: 9 — AB
Glucose: 49 mg/dL — ABNORMAL LOW (ref 65–99)
Osmolality: 273 (ref 275–301)
POTASSIUM: 3 mmol/L — AB (ref 3.5–5.1)
SGPT (ALT): 10 U/L — ABNORMAL LOW (ref 12–78)
SODIUM: 136 mmol/L (ref 136–145)
TOTAL PROTEIN: 7.6 g/dL (ref 6.4–8.2)

## 2013-12-24 LAB — PHOSPHORUS: PHOSPHORUS: 2.2 mg/dL — AB (ref 2.5–4.9)

## 2013-12-24 LAB — CK TOTAL AND CKMB (NOT AT ARMC)
CK, TOTAL: 43 U/L
CK-MB: 1.8 ng/mL (ref 0.5–3.6)

## 2013-12-24 LAB — TROPONIN I: Troponin-I: <0.02 ng/mL

## 2013-12-25 LAB — BASIC METABOLIC PANEL
Anion Gap: 5 — ABNORMAL LOW (ref 7–16)
BUN: 13 mg/dL (ref 7–18)
CO2: 34 mmol/L — AB (ref 21–32)
Calcium, Total: 7.6 mg/dL — ABNORMAL LOW (ref 8.5–10.1)
Chloride: 97 mmol/L — ABNORMAL LOW (ref 98–107)
Creatinine: 3.92 mg/dL — ABNORMAL HIGH (ref 0.60–1.30)
GFR CALC AF AMER: 16 — AB
GFR CALC NON AF AMER: 14 — AB
Glucose: 90 mg/dL (ref 65–99)
OSMOLALITY: 272 (ref 275–301)
Potassium: 3.7 mmol/L (ref 3.5–5.1)
Sodium: 136 mmol/L (ref 136–145)

## 2013-12-25 LAB — CBC WITH DIFFERENTIAL/PLATELET
BASOS ABS: 0 10*3/uL (ref 0.0–0.1)
Basophil %: 0.5 %
Eosinophil #: 0.1 10*3/uL (ref 0.0–0.7)
Eosinophil %: 0.8 %
HCT: 28.5 % — ABNORMAL LOW (ref 40.0–52.0)
HGB: 8.8 g/dL — ABNORMAL LOW (ref 13.0–18.0)
LYMPHS ABS: 1.1 10*3/uL (ref 1.0–3.6)
LYMPHS PCT: 18.2 %
MCH: 28 pg (ref 26.0–34.0)
MCHC: 31.1 g/dL — ABNORMAL LOW (ref 32.0–36.0)
MCV: 90 fL (ref 80–100)
MONO ABS: 0.6 x10 3/mm (ref 0.2–1.0)
MONOS PCT: 9.6 %
Neutrophil #: 4.4 10*3/uL (ref 1.4–6.5)
Neutrophil %: 70.9 %
Platelet: 191 10*3/uL (ref 150–440)
RBC: 3.16 10*6/uL — ABNORMAL LOW (ref 4.40–5.90)
RDW: 21.3 % — AB (ref 11.5–14.5)
WBC: 6.3 10*3/uL (ref 3.8–10.6)

## 2013-12-26 LAB — CBC WITH DIFFERENTIAL/PLATELET
Basophil #: 0 10*3/uL (ref 0.0–0.1)
Basophil %: 0.6 %
EOS PCT: 1.1 %
Eosinophil #: 0.1 10*3/uL (ref 0.0–0.7)
HCT: 28 % — ABNORMAL LOW (ref 40.0–52.0)
HGB: 8.5 g/dL — AB (ref 13.0–18.0)
Lymphocyte #: 1.3 10*3/uL (ref 1.0–3.6)
Lymphocyte %: 22.7 %
MCH: 27.4 pg (ref 26.0–34.0)
MCHC: 30.3 g/dL — AB (ref 32.0–36.0)
MCV: 91 fL (ref 80–100)
MONO ABS: 0.7 x10 3/mm (ref 0.2–1.0)
Monocyte %: 12.2 %
Neutrophil #: 3.7 10*3/uL (ref 1.4–6.5)
Neutrophil %: 63.4 %
Platelet: 204 10*3/uL (ref 150–440)
RBC: 3.09 10*6/uL — ABNORMAL LOW (ref 4.40–5.90)
RDW: 21.5 % — ABNORMAL HIGH (ref 11.5–14.5)
WBC: 5.8 10*3/uL (ref 3.8–10.6)

## 2013-12-26 LAB — BASIC METABOLIC PANEL
Anion Gap: 8 (ref 7–16)
BUN: 20 mg/dL — ABNORMAL HIGH (ref 7–18)
Calcium, Total: 8.1 mg/dL — ABNORMAL LOW (ref 8.5–10.1)
Chloride: 94 mmol/L — ABNORMAL LOW (ref 98–107)
Co2: 32 mmol/L (ref 21–32)
Creatinine: 5.26 mg/dL — ABNORMAL HIGH (ref 0.60–1.30)
GFR CALC AF AMER: 11 — AB
GFR CALC NON AF AMER: 10 — AB
GLUCOSE: 85 mg/dL (ref 65–99)
OSMOLALITY: 270 (ref 275–301)
Potassium: 4.6 mmol/L (ref 3.5–5.1)
Sodium: 134 mmol/L — ABNORMAL LOW (ref 136–145)

## 2013-12-26 LAB — PHOSPHORUS: PHOSPHORUS: 2.4 mg/dL — AB (ref 2.5–4.9)

## 2013-12-29 LAB — CULTURE, BLOOD (SINGLE)

## 2014-02-28 ENCOUNTER — Encounter: Payer: Self-pay | Admitting: Surgery

## 2014-03-26 ENCOUNTER — Ambulatory Visit: Payer: Self-pay | Admitting: Vascular Surgery

## 2014-04-06 LAB — WOUND CULTURE

## 2014-04-16 ENCOUNTER — Ambulatory Visit: Payer: Self-pay

## 2014-04-30 ENCOUNTER — Ambulatory Visit: Payer: Self-pay | Admitting: Vascular Surgery

## 2014-05-07 ENCOUNTER — Encounter: Payer: Self-pay | Admitting: General Surgery

## 2014-06-05 ENCOUNTER — Ambulatory Visit: Payer: Self-pay | Admitting: Vascular Surgery

## 2014-06-25 ENCOUNTER — Encounter (HOSPITAL_COMMUNITY)
Admission: AD | Disposition: A | Discharge status: Skilled Nursing Facility | Payer: Self-pay | Source: Other Acute Inpatient Hospital | Attending: Interventional Cardiology

## 2014-06-25 ENCOUNTER — Encounter (HOSPITAL_COMMUNITY): Payer: Self-pay | Admitting: Interventional Cardiology

## 2014-06-25 ENCOUNTER — Inpatient Hospital Stay (HOSPITAL_COMMUNITY)
Admission: AD | Admit: 2014-06-25 | Discharge: 2014-06-28 | DRG: 246 | Disposition: A | Discharge status: Skilled Nursing Facility | Payer: Medicare Other | Source: Other Acute Inpatient Hospital | Attending: Interventional Cardiology | Admitting: Interventional Cardiology

## 2014-06-25 ENCOUNTER — Emergency Department: Payer: Self-pay | Admitting: Emergency Medicine

## 2014-06-25 DIAGNOSIS — N186 End stage renal disease: Secondary | ICD-10-CM | POA: Diagnosis present

## 2014-06-25 DIAGNOSIS — Z794 Long term (current) use of insulin: Secondary | ICD-10-CM | POA: Diagnosis not present

## 2014-06-25 DIAGNOSIS — Z992 Dependence on renal dialysis: Secondary | ICD-10-CM | POA: Diagnosis not present

## 2014-06-25 DIAGNOSIS — I739 Peripheral vascular disease, unspecified: Secondary | ICD-10-CM | POA: Diagnosis present

## 2014-06-25 DIAGNOSIS — E785 Hyperlipidemia, unspecified: Secondary | ICD-10-CM | POA: Diagnosis present

## 2014-06-25 DIAGNOSIS — Z7982 Long term (current) use of aspirin: Secondary | ICD-10-CM | POA: Diagnosis not present

## 2014-06-25 DIAGNOSIS — I2510 Atherosclerotic heart disease of native coronary artery without angina pectoris: Secondary | ICD-10-CM | POA: Diagnosis present

## 2014-06-25 DIAGNOSIS — N189 Chronic kidney disease, unspecified: Secondary | ICD-10-CM

## 2014-06-25 DIAGNOSIS — I120 Hypertensive chronic kidney disease with stage 5 chronic kidney disease or end stage renal disease: Secondary | ICD-10-CM | POA: Diagnosis present

## 2014-06-25 DIAGNOSIS — E119 Type 2 diabetes mellitus without complications: Secondary | ICD-10-CM | POA: Diagnosis present

## 2014-06-25 DIAGNOSIS — L89613 Pressure ulcer of right heel, stage 3: Secondary | ICD-10-CM | POA: Diagnosis present

## 2014-06-25 DIAGNOSIS — Z87891 Personal history of nicotine dependence: Secondary | ICD-10-CM | POA: Diagnosis not present

## 2014-06-25 DIAGNOSIS — I10 Essential (primary) hypertension: Secondary | ICD-10-CM

## 2014-06-25 DIAGNOSIS — E1129 Type 2 diabetes mellitus with other diabetic kidney complication: Secondary | ICD-10-CM

## 2014-06-25 DIAGNOSIS — H540 Blindness, both eyes: Secondary | ICD-10-CM | POA: Diagnosis present

## 2014-06-25 DIAGNOSIS — I214 Non-ST elevation (NSTEMI) myocardial infarction: Principal | ICD-10-CM

## 2014-06-25 DIAGNOSIS — D631 Anemia in chronic kidney disease: Secondary | ICD-10-CM

## 2014-06-25 DIAGNOSIS — I2129 ST elevation (STEMI) myocardial infarction involving other sites: Secondary | ICD-10-CM | POA: Diagnosis present

## 2014-06-25 HISTORY — DX: Peripheral vascular disease, unspecified: I73.9

## 2014-06-25 HISTORY — DX: Atherosclerotic heart disease of native coronary artery without angina pectoris: I25.10

## 2014-06-25 HISTORY — DX: ST elevation (STEMI) myocardial infarction involving other sites: I21.29

## 2014-06-25 HISTORY — PX: PERCUTANEOUS CORONARY STENT INTERVENTION (PCI-S): SHX5485

## 2014-06-25 HISTORY — PX: LEFT HEART CATHETERIZATION WITH CORONARY ANGIOGRAM: SHX5451

## 2014-06-25 HISTORY — PX: CARDIAC CATHETERIZATION: SHX172

## 2014-06-25 HISTORY — DX: Essential (primary) hypertension: I10

## 2014-06-25 HISTORY — DX: Type 2 diabetes mellitus without complications: E11.9

## 2014-06-25 LAB — CBC WITH DIFFERENTIAL/PLATELET
BASOS PCT: 0.8 %
Basophil #: 0.1 10*3/uL (ref 0.0–0.1)
Eosinophil #: 0 10*3/uL (ref 0.0–0.7)
Eosinophil %: 0.1 %
HCT: 29.4 % — ABNORMAL LOW (ref 40.0–52.0)
HGB: 9 g/dL — ABNORMAL LOW (ref 13.0–18.0)
Lymphocyte #: 0.8 10*3/uL — ABNORMAL LOW (ref 1.0–3.6)
Lymphocyte %: 4.7 %
MCH: 29.1 pg (ref 26.0–34.0)
MCHC: 30.5 g/dL — ABNORMAL LOW (ref 32.0–36.0)
MCV: 95 fL (ref 80–100)
MONOS PCT: 8.1 %
Monocyte #: 1.3 x10 3/mm — ABNORMAL HIGH (ref 0.2–1.0)
Neutrophil #: 14.1 10*3/uL — ABNORMAL HIGH (ref 1.4–6.5)
Neutrophil %: 86.3 %
PLATELETS: 248 10*3/uL (ref 150–440)
RBC: 3.09 10*6/uL — ABNORMAL LOW (ref 4.40–5.90)
RDW: 17 % — ABNORMAL HIGH (ref 11.5–14.5)
WBC: 16.3 10*3/uL — AB (ref 3.8–10.6)

## 2014-06-25 LAB — COMPREHENSIVE METABOLIC PANEL
ALBUMIN: 2.4 g/dL — AB (ref 3.4–5.0)
ALT: 14 U/L
Alkaline Phosphatase: 137 U/L — ABNORMAL HIGH
Anion Gap: 11 (ref 7–16)
BILIRUBIN TOTAL: 0.5 mg/dL (ref 0.2–1.0)
BUN: 20 mg/dL — ABNORMAL HIGH (ref 7–18)
CHLORIDE: 95 mmol/L — AB (ref 98–107)
Calcium, Total: 8.3 mg/dL — ABNORMAL LOW (ref 8.5–10.1)
Co2: 33 mmol/L — ABNORMAL HIGH (ref 21–32)
Creatinine: 4.53 mg/dL — ABNORMAL HIGH (ref 0.60–1.30)
EGFR (Non-African Amer.): 13 — ABNORMAL LOW
GFR CALC AF AMER: 16 — AB
Glucose: 140 mg/dL — ABNORMAL HIGH (ref 65–99)
Osmolality: 282 (ref 275–301)
Potassium: 4.4 mmol/L (ref 3.5–5.1)
SGOT(AST): 27 U/L (ref 15–37)
Sodium: 139 mmol/L (ref 136–145)
Total Protein: 7.5 g/dL (ref 6.4–8.2)

## 2014-06-25 LAB — MRSA PCR SCREENING: MRSA by PCR: NEGATIVE

## 2014-06-25 LAB — GLUCOSE, CAPILLARY
GLUCOSE-CAPILLARY: 132 mg/dL — AB (ref 70–99)
GLUCOSE-CAPILLARY: 217 mg/dL — AB (ref 70–99)
Glucose-Capillary: 252 mg/dL — ABNORMAL HIGH (ref 70–99)

## 2014-06-25 LAB — POCT ACTIVATED CLOTTING TIME
ACTIVATED CLOTTING TIME: 259 s
Activated Clotting Time: 236 seconds
Activated Clotting Time: 185 seconds
Activated Clotting Time: 169 seconds

## 2014-06-25 LAB — TROPONIN I
Troponin I: <0.3 ng/mL (ref <0.30)
Troponin I: <0.3 ng/mL (ref <0.30)

## 2014-06-25 SURGERY — LEFT HEART CATHETERIZATION WITH CORONARY ANGIOGRAM
Anesthesia: LOCAL

## 2014-06-25 MED ORDER — CLOPIDOGREL BISULFATE 75 MG PO TABS
ORAL_TABLET | ORAL | Status: AC
  Filled 2014-06-25: qty 4

## 2014-06-25 MED ORDER — NEPRO/CARBSTEADY PO LIQD
237.0000 mL | Freq: Two times a day (BID) | ORAL | Status: DC
  Administered 2014-06-25 – 2014-06-28 (×4): 237 mL via ORAL
  Filled 2014-06-25 (×9): qty 237

## 2014-06-25 MED ORDER — RENA-VITE PO TABS
1.0000 | ORAL_TABLET | Freq: Every day | ORAL | Status: DC
  Administered 2014-06-25 – 2014-06-28 (×3): 1 via ORAL
  Filled 2014-06-25 (×3): qty 1

## 2014-06-25 MED ORDER — CLOPIDOGREL BISULFATE 75 MG PO TABS: 75.0000 mg | ORAL_TABLET | Freq: Every day | ORAL | Status: DC

## 2014-06-25 MED ORDER — INSULIN ASPART 100 UNIT/ML ~~LOC~~ SOLN
0.0000 [IU] | Freq: Every day | SUBCUTANEOUS | Status: DC
  Administered 2014-06-25: 3 [IU] via SUBCUTANEOUS
  Administered 2014-06-26: 2 [IU] via SUBCUTANEOUS

## 2014-06-25 MED ORDER — NITROGLYCERIN 1 MG/10 ML FOR IR/CATH LAB
INTRA_ARTERIAL | Status: AC
  Filled 2014-06-25: qty 10

## 2014-06-25 MED ORDER — MIDAZOLAM HCL 2 MG/2ML IJ SOLN
INTRAMUSCULAR | Status: AC
  Filled 2014-06-25: qty 2

## 2014-06-25 MED ORDER — DOXERCALCIFEROL 4 MCG/2ML IV SOLN
1.0000 ug | INTRAVENOUS | Status: DC
  Administered 2014-06-26 – 2014-06-28 (×2): 1 ug via INTRAVENOUS
  Filled 2014-06-25 (×2): qty 2

## 2014-06-25 MED ORDER — ONDANSETRON HCL 4 MG/2ML IJ SOLN: 4.0000 mg | Freq: Four times a day (QID) | INTRAMUSCULAR | Status: DC | PRN

## 2014-06-25 MED ORDER — INSULIN ASPART 100 UNIT/ML ~~LOC~~ SOLN
0.0000 [IU] | Freq: Three times a day (TID) | SUBCUTANEOUS | Status: DC
  Administered 2014-06-26: 8 [IU] via SUBCUTANEOUS
  Administered 2014-06-27: 2 [IU] via SUBCUTANEOUS
  Administered 2014-06-27: 3 [IU] via SUBCUTANEOUS

## 2014-06-25 MED ORDER — OXYCODONE HCL 5 MG PO TABS: 5.0000 mg | ORAL_TABLET | Freq: Four times a day (QID) | ORAL | Status: DC | PRN

## 2014-06-25 MED ORDER — HEPARIN (PORCINE) IN NACL 2-0.9 UNIT/ML-% IJ SOLN
INTRAMUSCULAR | Status: AC
  Filled 2014-06-25: qty 1500

## 2014-06-25 MED ORDER — SODIUM CHLORIDE 0.9 % IJ SOLN
3.0000 mL | Freq: Two times a day (BID) | INTRAMUSCULAR | Status: DC
  Administered 2014-06-25 – 2014-06-27 (×3): 3 mL via INTRAVENOUS

## 2014-06-25 MED ORDER — CINACALCET HCL 30 MG PO TABS: 60.0000 mg | ORAL_TABLET | Freq: Every day | ORAL | Status: DC

## 2014-06-25 MED ORDER — NITROGLYCERIN 0.4 MG SL SUBL: 0.4000 mg | SUBLINGUAL_TABLET | SUBLINGUAL | Status: DC | PRN

## 2014-06-25 MED ORDER — LOSARTAN POTASSIUM 25 MG PO TABS: 25.0000 mg | ORAL_TABLET | Freq: Every day | ORAL | Status: DC

## 2014-06-25 MED ORDER — HEPARIN SODIUM (PORCINE) 1000 UNIT/ML IJ SOLN
INTRAMUSCULAR | Status: AC
  Filled 2014-06-25: qty 1

## 2014-06-25 MED ORDER — FENTANYL CITRATE 0.05 MG/ML IJ SOLN
INTRAMUSCULAR | Status: AC
  Filled 2014-06-25: qty 2

## 2014-06-25 MED ORDER — ACETAMINOPHEN 325 MG PO TABS
650.0000 mg | ORAL_TABLET | ORAL | Status: DC | PRN
Start: 2014-06-25 — End: 2014-06-28

## 2014-06-25 MED ORDER — AMLODIPINE BESYLATE 2.5 MG PO TABS
2.5000 mg | ORAL_TABLET | Freq: Every day | ORAL | Status: DC
  Administered 2014-06-25 – 2014-06-27 (×3): 2.5 mg via ORAL
  Filled 2014-06-25 (×3): qty 1

## 2014-06-25 MED ORDER — PRAVASTATIN SODIUM 20 MG PO TABS
20.0000 mg | ORAL_TABLET | Freq: Every day | ORAL | Status: DC
  Administered 2014-06-25 – 2014-06-28 (×4): 20 mg via ORAL
  Filled 2014-06-25 (×5): qty 1

## 2014-06-25 MED ORDER — SODIUM CHLORIDE 0.9 % IV SOLN: 250.0000 mL | INTRAVENOUS | Status: DC | PRN

## 2014-06-25 MED ORDER — FAMOTIDINE IN NACL 20-0.9 MG/50ML-% IV SOLN
INTRAVENOUS | Status: AC
  Filled 2014-06-25: qty 50

## 2014-06-25 MED ORDER — METOPROLOL TARTRATE 50 MG PO TABS
50.0000 mg | ORAL_TABLET | Freq: Two times a day (BID) | ORAL | Status: DC
  Administered 2014-06-25 – 2014-06-27 (×4): 50 mg via ORAL
  Filled 2014-06-25 (×6): qty 1

## 2014-06-25 MED ORDER — METHYLPREDNISOLONE SODIUM SUCC 125 MG IJ SOLR
INTRAMUSCULAR | Status: AC
  Filled 2014-06-25: qty 2

## 2014-06-25 MED ORDER — NEPRO PO LIQD: 8.0000 [oz_av] | Freq: Two times a day (BID) | ORAL | Status: DC

## 2014-06-25 MED ORDER — LOSARTAN POTASSIUM 25 MG PO TABS
25.0000 mg | ORAL_TABLET | Freq: Every day | ORAL | Status: DC
  Administered 2014-06-25 – 2014-06-27 (×3): 25 mg via ORAL
  Filled 2014-06-25 (×3): qty 1

## 2014-06-25 MED ORDER — ACETAMINOPHEN 325 MG PO TABS: 650.0000 mg | ORAL_TABLET | ORAL | Status: DC | PRN

## 2014-06-25 MED ORDER — NITROGLYCERIN 0.6 MG SL SUBL: 0.6000 mg | SUBLINGUAL_TABLET | SUBLINGUAL | Status: DC | PRN

## 2014-06-25 MED ORDER — CINACALCET HCL 30 MG PO TABS
60.0000 mg | ORAL_TABLET | Freq: Every day | ORAL | Status: DC
  Administered 2014-06-25 – 2014-06-28 (×3): 60 mg via ORAL
  Filled 2014-06-25 (×8): qty 2

## 2014-06-25 MED ORDER — GLIMEPIRIDE 4 MG PO TABS
4.0000 mg | ORAL_TABLET | Freq: Every day | ORAL | Status: DC
  Administered 2014-06-26 – 2014-06-27 (×2): 4 mg via ORAL
  Filled 2014-06-25 (×3): qty 1

## 2014-06-25 MED ORDER — CLOPIDOGREL BISULFATE 75 MG PO TABS
75.0000 mg | ORAL_TABLET | Freq: Every day | ORAL | Status: DC
  Administered 2014-06-26 – 2014-06-28 (×3): 75 mg via ORAL
  Filled 2014-06-25 (×3): qty 1

## 2014-06-25 MED ORDER — OXYCODONE HCL 5 MG PO CAPS: 5.0000 mg | ORAL_CAPSULE | Freq: Four times a day (QID) | ORAL | Status: DC | PRN

## 2014-06-25 MED ORDER — ACETAMINOPHEN ER 650 MG PO TBCR: 650.0000 mg | EXTENDED_RELEASE_TABLET | ORAL | Status: DC | PRN

## 2014-06-25 MED ORDER — ASPIRIN EC 81 MG PO TBEC
81.0000 mg | DELAYED_RELEASE_TABLET | Freq: Every day | ORAL | Status: DC
  Filled 2014-06-25: qty 1

## 2014-06-25 MED ORDER — INSULIN GLARGINE 100 UNIT/ML ~~LOC~~ SOLN
11.0000 [IU] | Freq: Every morning | SUBCUTANEOUS | Status: DC
  Administered 2014-06-26 – 2014-06-28 (×3): 11 [IU] via SUBCUTANEOUS
  Filled 2014-06-25 (×3): qty 0.11

## 2014-06-25 MED ORDER — ALBUTEROL SULFATE (2.5 MG/3ML) 0.083% IN NEBU: 2.5000 mg | INHALATION_SOLUTION | Freq: Four times a day (QID) | RESPIRATORY_TRACT | Status: DC | PRN

## 2014-06-25 MED ORDER — FERROUS SULFATE 325 (65 FE) MG PO TABS
325.0000 mg | ORAL_TABLET | Freq: Two times a day (BID) | ORAL | Status: DC
  Filled 2014-06-25 (×2): qty 1

## 2014-06-25 MED ORDER — SODIUM CHLORIDE 0.9 % IJ SOLN: 3.0000 mL | INTRAMUSCULAR | Status: DC | PRN

## 2014-06-25 MED ORDER — LIDOCAINE HCL (PF) 1 % IJ SOLN
INTRAMUSCULAR | Status: AC
  Filled 2014-06-25: qty 30

## 2014-06-25 MED ORDER — DIPHENHYDRAMINE HCL 50 MG/ML IJ SOLN
INTRAMUSCULAR | Status: AC
  Filled 2014-06-25: qty 1

## 2014-06-25 MED ORDER — ASPIRIN 81 MG PO CHEW
81.0000 mg | CHEWABLE_TABLET | Freq: Every day | ORAL | Status: DC
  Administered 2014-06-25 – 2014-06-28 (×4): 81 mg via ORAL
  Filled 2014-06-25 (×3): qty 1

## 2014-06-25 MED ORDER — DARBEPOETIN ALFA-POLYSORBATE 60 MCG/0.3ML IJ SOLN
60.0000 ug | INTRAMUSCULAR | Status: DC
  Administered 2014-06-26: 60 ug via INTRAVENOUS
  Filled 2014-06-25: qty 0.3

## 2014-06-25 MED FILL — Nitroglycerin IV Soln 200 MCG/ML in D5W: INTRAVENOUS | Qty: 250 | Status: AC

## 2014-06-25 NOTE — Consult Note (Signed)
[facility] Renal Consultation Note    Indication for Consultation:  Management of ESRD/hemodialysis; anemia, hypertension/volume and secondary hyperparathyroidism PCP:  HPI: [name] is a 79 y.o.AA male. With ESRD on MWF HD in [place] with DM,HTN, PVD, CAD who was transferred from [facility] today for urgent heart cath.  He did attend dialysis yesterday and attained EDW with net UF of 4.9 (more than usual UF) pre HD BP was 146/77 and post BP 109/45 (both sitting - review of records shows he normally attains EDW. He is unable to give history due to post procedure sedation. He resides at [facility] and was taken to [facility] this am. 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  Past Medical History  Diagnosis Date  . Acute MI, lateral wall, initial episode of care   . ESRD (end stage renal disease)     on HD M/W/F  . Diabetes   . CAD (coronary artery disease)   . HTN (hypertension)   . PVD (peripheral vascular disease)    Past Surgical History  Procedure Laterality Date  . R leg surgery    . Cardiac catheterization  06/25/2014    NSTEMI s/p DES to LCx, subtotally occluded prox RCA   Family History  Problem Relation Age of Onset  . Bone cancer Father    Social History:  reports that he has quit smoking. He does not have any smokeless tobacco history on file. He reports that he drinks alcohol. He reports that he does not use illicit drugs. Allergies  Allergen Reactions  . Contrast Media [Iodinated Diagnostic Agents] Hives  . Fish Allergy Hives    Shell fish   Prior to Admission medications   Medication Sig Start Date End Date Taking? Authorizing Provider  acetaminophen (TYLENOL) 650 MG CR tablet Take 650 mg by mouth every 4 (four) hours as needed for pain or fever.   Yes Historical Provider, MD  albuterol (PROVENTIL HFA;VENTOLIN HFA) 108 (90 BASE) MCG/ACT inhaler Inhale 2 puffs into the lungs every 6 (six) hours as needed for wheezing or shortness of breath.   Yes  Historical Provider, MD  amLODipine (NORVASC) 2.5 MG tablet Take 2.5 mg by mouth daily.   Yes Historical Provider, MD  amoxicillin-clavulanate (AUGMENTIN) 875-125 MG per tablet Take 1 tablet by mouth 2 (two) times daily. For right lower extremity wounds 06/17/14 06/25/14 Yes Historical Provider, MD  aspirin EC 81 MG tablet Take 81 mg by mouth daily.   Yes Historical Provider, MD  cinacalcet (SENSIPAR) 60 MG tablet Take 60 mg by mouth daily.   Yes Historical Provider, MD  clopidogrel (PLAVIX) 75 MG tablet Take 75 mg by mouth daily.   Yes Historical Provider, MD  ferrous sulfate 325 (65 FE) MG tablet Take 325 mg by mouth 2 (two) times daily with a meal.   Yes Historical Provider, MD  glimepiride (AMARYL) 4 MG tablet Take 4 mg by mouth daily with breakfast.   Yes Historical Provider, MD  insulin glargine (LANTUS) 100 UNIT/ML injection Inject 11 Units into the skin every morning.   Yes Historical Provider, MD  losartan (COZAAR) 100 MG tablet Take 100 mg by mouth daily.   Yes Historical Provider, MD  metoprolol (LOPRESSOR) 50 MG tablet Take 50 mg by mouth 2 (two) times daily.   Yes Historical Provider, MD  multivitamin (RENA-VIT) TABS tablet Take 1 tablet by mouth daily.   Yes Historical Provider, MD  nitroGLYCERIN (NITROSTAT) 0.6 MG SL tablet Place 0.6 mg under the tongue every 5 (  five) minutes as needed for chest pain.   Yes Historical Provider, MD  Nutritional Supplements (NEPRO) LIQD Take 8 oz by mouth 2 (two) times daily.   Yes Historical Provider, MD  oxycodone (OXY-IR) 5 MG capsule Take 5 mg by mouth every 6 (six) hours as needed for pain.   Yes Historical Provider, MD  pravastatin (PRAVACHOL) 20 MG tablet Take 20 mg by mouth daily.   Yes Historical Provider, MD   Current Facility-Administered Medications  Medication Dose Route Frequency Provider Last Rate Last Dose  . 0.9 %  sodium chloride infusion  250 mL Intravenous PRN Corky CraftsJayadeep S Varanasi, MD      . acetaminophen (TYLENOL) tablet 650 mg   650 mg Oral Q4H PRN Corky CraftsJayadeep S Varanasi, MD      . acetaminophen (TYLENOL) tablet 650 mg  650 mg Oral Q4H PRN Azalee CourseHao Meng, PA      . albuterol (PROVENTIL) (2.5 MG/3ML) 0.083% nebulizer solution 2.5 mg  2.5 mg Inhalation Q6H PRN Corky CraftsJayadeep S Varanasi, MD      . amLODipine (NORVASC) tablet 2.5 mg  2.5 mg Oral Daily Corky CraftsJayadeep S Varanasi, MD      . aspirin chewable tablet 81 mg  81 mg Oral Daily Corky CraftsJayadeep S Varanasi, MD      . cinacalcet (SENSIPAR) tablet 60 mg  60 mg Oral Q breakfast Corky CraftsJayadeep S Varanasi, MD      . Melene Muller[START ON 06/26/2014] clopidogrel (PLAVIX) tablet 75 mg  75 mg Oral Q breakfast Corky CraftsJayadeep S Varanasi, MD      . Melene Muller[START ON 06/26/2014] darbepoetin (ARANESP) injection 60 mcg  60 mcg Intravenous Q Wed-HD Sheffield SliderMartha B. Tavarion Babington, PA-C      . Melene Muller[START ON 06/26/2014] doxercalciferol (HECTOROL) injection 1 mcg  1 mcg Intravenous Q M,W,F-HD Sheffield SliderMartha B. Leroy Pettway, PA-C      . feeding supplement (NEPRO CARB STEADY) liquid 237 mL  237 mL Oral BID BM Corky CraftsJayadeep S Varanasi, MD      . Melene Muller[START ON 06/26/2014] glimepiride (AMARYL) tablet 4 mg  4 mg Oral Q breakfast Corky CraftsJayadeep S Varanasi, MD      . Melene Muller[START ON 06/26/2014] insulin glargine (LANTUS) injection 11 Units  11 Units Subcutaneous q morning - 10a Corky CraftsJayadeep S Varanasi, MD      . losartan (COZAAR) tablet 25 mg  25 mg Oral Daily Corky CraftsJayadeep S Varanasi, MD      . metoprolol (LOPRESSOR) tablet 50 mg  50 mg Oral BID Corky CraftsJayadeep S Varanasi, MD      . multivitamin (RENA-VIT) tablet 1 tablet  1 tablet Oral Daily Corky CraftsJayadeep S Varanasi, MD      . nitroGLYCERIN (NITROSTAT) SL tablet 0.4 mg  0.4 mg Sublingual Q5 Min x 3 PRN Azalee CourseHao Meng, PA      . ondansetron (ZOFRAN) injection 4 mg  4 mg Intravenous Q6H PRN Corky CraftsJayadeep S Varanasi, MD      . ondansetron [facility](ZOFRAN) injection 4 mg  4 mg Intravenous Q6H PRN Azalee CourseHao Meng, PA      . oxyCODONE (Oxy IR/ROXICODONE) immediate release tablet 5 mg  5 mg Oral Q6H PRN Corky CraftsJayadeep S Varanasi, MD      . pravastatin (PRAVACHOL) tablet 20 mg  20 mg Oral QPC supper Corky CraftsJayadeep S  Varanasi, MD      . sodium chloride 0.9 % injection 3 mL  3 mL Intravenous Q12H Corky CraftsJayadeep S Varanasi, MD      . sodium chloride 0.9 % injection 3 mL  3 mL Intravenous PRN Corky CraftsJayadeep S Varanasi, MD  Labs: Basic Metabolic Panel: No results found for this basename: NA, K, CL, CO2, GLUCOSE, BUN, CREATININE, CALCIUM, ALB, PHOS,  in the last 168 hours Liver Function Tests: No results found for this basename: AST, ALT, ALKPHOS, BILITOT, PROT, ALBUMIN,  in the last 168 hours No results found for this basename: LIPASE, AMYLASE,  in the last 168 hours No results found for this basename: AMMONIA,  in the last 168 hours CBC: No results found for this basename: WBC, NEUTROABS, HGB, HCT, MCV, PLT,  in the last 168 hours Cardiac Enzymes:  Recent Labs Lab 06/25/14 1457  TROPONINI <0.30   CBG:  Recent Labs Lab 06/25/14 1028  GLUCAP 132*   Iron Studies: No results found for this basename: IRON, TIBC, TRANSFERRIN, FERRITIN,  in the last 72 hours Studies/Results: No results found.  ROS: Unable to give history - due to sedation post cath  Physical Exam: Filed Vitals:   06/25/14 1225 06/25/14 1230 06/25/14 1235 06/25/14 1300  BP: 112/49 111/43 137/45 135/58  Pulse: 68 67 71 72  Temp:    97.6 F (36.4 C)  TempSrc:    Oral  Resp: 11 11 11 11   Height:    5\' 8"  (1.727 m)  Weight:    98.1 kg (216 lb 4.3 oz)  SpO2: 100% 100% 100% 100%     General: Well developed, well nourished, in no acute distress breathing easily Head: Normocephalic, atraumatic, sclera non-icteric, mucus membranes are moist Neck: Supple. JVD not elevated. Lungs: Clear bilaterally to auscultation without wheezes, rales, or rhonchi. Breathing is unlabored. Heart: RRR with S1 S2. No murmurs, rubs, or gallops appreciated. Abdomen: Soft, non-tender, non-distended with normoactive bowel sounds. No rebound/guarding. No obvious abdominal masses. M-S:  Strength and tone appear normal for age. Lower extremities: right lower  without significant edema though hard to assess due to RLE wounds that are dressed; s/p 4th and 5th right toe amp well healed; LLE no edema or open wounds  Neuro:  Sleeping soundly - rouses but very very drowsy and falls back asleep Dialysis Access: left upper AVGG + bruit  Dialysis Orders: Center: [facility] MWF HD . 4 hr 180 450/800 EDW 99 2K 2 Ca left upper  AVGG no profile Aranesp 60 - just ^ 10/21 Hectorol 1 heaprin 5000 with 1000 mid tmt Recent outpt labs:  Hgb 8.8 10/21, 8.4 10/14, 9.9 10./07 ferritin 1484 Sept iPTH 451 Sept tsat 21% 10/15, Ca and P controlled  Assessment/Plan: 1. NSTEMI, lateral wall MI - s/p emergent heart cath today - s/p PTA and drug eluting stent of 80 - 90% mid cx, EF to be assessed via Echo 2. ESRD -  MWF - HD Wed K 4.4 this am at [facility] 3. Hypertension/volume  - controlled with volume removal and current meds norvasc 10 and losartan 100  And metoprolol 50 bid 4. Anemia  - [facility] - stable - continue weekly ESA - due Wed 5. Metabolic bone disease -  Labs pending - on sensipar but no binders per MAR 6. Nutrition - renal carb mod diet + suppl/vit when eating 7. DM - per primary 8. PVD with chronic LE wounds - on Augmentin 875 BID just completed- per MAR-(higher than rec for ESRD pt)- local care  Sheffield SliderMartha B Amberli Ruegg, PA-C [facility] Beeper [phone] 06/25/2014, 4:12 PM

## 2014-06-25 NOTE — Consult Note (Signed)
I saw the patient and agree with the above assessment and plan.  For HD tomorrow.  S/p PCI today.  Volume status is stable.  K was WNL earlier today.

## 2014-06-25 NOTE — Progress Notes (Signed)
Site area: rt groin Site Prior to Removal:  Level 0 Pressure Applied For: 20 minutes Manual:   yes Patient Status During Pull:  stable Post Pull Site:  Level 0 Post Pull Instructions Given:  yes Post Pull Pulses Present: yes Dressing Applied:  tegaderm Bedrest begins @ 1225 Comments: no complications

## 2014-06-25 NOTE — H&P (Addendum)
Patient ID: [name] MRN: [id number], DOB/AGE: 05-Apr-1935   Admit date: 06/25/2014   Primary Physician: No primary provider on file. Primary Cardiologist: New   Pt. Profile:  79 year old African-American male was past medical history of hypertension, questionable history of coronary artery disease, history of peripheral vascular disease on DAPT, and history of end-stage renal disease on hemodialysis Monday Wednesday and Friday tranferred from [place] [facility]  Problem List  Past Medical History  Diagnosis Date  . Acute MI, lateral wall, initial episode of care   . ESRD (end stage renal disease)     on HD M/W/F  . Diabetes   . CAD (coronary artery disease)   . HTN (hypertension)   . PVD (peripheral vascular disease)     Past Surgical History  Procedure Laterality Date  . R leg surgery    . Cardiac catheterization  06/25/2014    NSTEMI s/p DES to LCx, subtotally occluded prox RCA     Allergies  Allergies  Allergen Reactions  . Contrast Media [Iodinated Diagnostic Agents] Hives  . Fish Allergy Hives    Shell fish    HPI  The patient is a 79 year old African-American male was past medical history of hypertension, questionable history of coronary artery disease, history of peripheral vascular disease on DAPT, and history of end-stage renal disease on hemodialysis Monday Wednesday and Friday. Patient was [facility]2014 for lateral NSTEMI and underwent urgent cardiac catheterization which revealed 80-90% stenosis in the mid left circumflex artery. The lesion was treated with drug-eluting stent. He also has a subtotal occlusion of proximal RCA, medical management was recommended. Patient was seen again after cardiac catheterization. Of note, patient is a poor historian, and he was coming off of sedation when interviewed. He states he has a history of coronary artery  disease, however does not see a cardiologist on a regular basis. He is also on hemodialysis Monday, Wednesday, and Friday.   Post-cath, patient was continued on aspirin and Plavix. He will need to continue on dual antiplatelet medication for at least one year after drug-eluting stent placement.   Home Medications  Prior to Admission medications   Medication Sig Start Date End Date Taking? Authorizing Provider  acetaminophen (TYLENOL) 650 MG CR tablet Take 650 mg by mouth every 4 (four) hours as needed for pain or fever.   Yes Historical Provider, MD  albuterol (PROVENTIL HFA;VENTOLIN HFA) 108 (90 BASE) MCG/ACT inhaler Inhale 2 puffs into the lungs every 6 (six) hours as needed for wheezing or shortness of breath.   Yes Historical Provider, MD  amLODipine (NORVASC) 2.5 MG tablet Take 2.5 mg by mouth daily.   Yes Historical Provider, MD  amoxicillin-clavulanate (AUGMENTIN) 875-125 MG per tablet Take 1 tablet by mouth 2 (two) times daily. For right lower extremity wounds 06/17/14 06/25/14 Yes Historical Provider, MD  aspirin EC 81 MG tablet Take 81 mg by mouth daily.   Yes Historical Provider, MD  cinacalcet (SENSIPAR) 60 MG tablet Take 60 mg by mouth daily.   Yes Historical Provider, MD  clopidogrel (PLAVIX) 75 MG tablet Take 75 mg by mouth daily.   Yes Historical Provider, MD  ferrous sulfate 325 (65 FE) MG tablet Take 325 mg by mouth 2 (two) times daily with a meal.   Yes Historical Provider, MD  glimepiride (AMARYL) 4 MG tablet Take 4 mg by mouth daily with breakfast.   Yes Historical Provider, MD  insulin glargine (LANTUS) 100 UNIT/ML injection Inject 11 Units into the skin every morning.   Yes Historical Provider, MD  losartan (COZAAR) 100 MG tablet Take 100 mg by mouth daily.   Yes Historical Provider, MD  metoprolol (LOPRESSOR) 50 MG tablet Take 50 mg by mouth 2 (two) times daily.   Yes Historical Provider, MD  multivitamin (RENA-VIT) TABS tablet Take 1 tablet by mouth daily.   Yes  Historical Provider, MD  nitroGLYCERIN (NITROSTAT) 0.6 MG SL tablet Place 0.6 mg under the tongue every 5 (five) minutes as needed for chest pain.   Yes Historical Provider, MD  Nutritional Supplements (NEPRO) LIQD Take 8 oz by mouth 2 (two) times daily.   Yes Historical Provider, MD  oxycodone (OXY-IR) 5 MG capsule Take 5 mg by mouth every 6 (six) hours as needed for pain.   Yes Historical Provider, MD  pravastatin (PRAVACHOL) 20 MG tablet Take 20 mg by mouth daily.   Yes Historical Provider, MD    Family History  Family History  Problem Relation Age of Onset  . Bone cancer Father     Social History  History   Social History  . Marital Status: Married    Spouse Name: N/A    Number of Children: N/A  . Years of Education: N/A   Occupational History  . Not on file.   Social History Main Topics  . Smoking status: Former Games developermoker  . Smokeless tobacco: Not on file     Comment: quit 57 yrs ago  . Alcohol Use: Yes     Comment: occasionally  . Drug Use: No     Comment: mumbled, was just coming off sedation when asked.  . Sexual Activity: Not on file   Other Topics Concern  . Not on file   Social History Narrative  . No narrative on file     Review of Systems ROS difficult to obtain as patient was coming off sedation and require repeat questioning to give simple answer General:  No chills, fever, night sweats or weight changes.  Cardiovascular:  +chest pain Dermatological: No rash, lesions/masses Respiratory: No cough, dyspnea Urologic: No hematuria, dysuria Abdominal:   No nausea, vomiting, diarrhea Neurologic:  No wkns, changes in mental status.  Blind? All other systems reviewed and are otherwise negative except as noted above.  Physical Exam  Blood pressure 101/69, pulse 75, resp. rate 11, SpO2 100.00%.  General: Coming off of sedation, sleepy. Need to arouse multiple times to answer qestion Psych: Normal affect. Neuro: Alert and oriented X 3. Moves all  extremities spontaneously. HEENT: Normal  Neck: Supple without bruits or JVD. Lungs:  Resp regular and unlabored, CTA. Heart: RRR no s3, s4, or murmurs. Abdomen: Soft, non-tender, non-distended, BS + x 4.  Extremities: No clubbing, cyanosis or edema. DP/PT/Radials 2+ and equal bilaterally.    Radiology/Studies  No results found.  ECG  Normal sinus rhythm with mild ST elevation in lead 1 and aVL, T-wave inversion in III   ASSESSMENT AND PLAN  1. Lateral NSTEMI  - cath 06/25/2014 80-90% mid LCx treated with DES, subtotally occluded prox RCA  - consider obtain Echo to eval EF, EF not assessed during cath  2. CAD see#1 3. PVD on DAPT 4. DM: place on SSI 5. HTN 6. ESRD M/W/F: will contact nephrology for HD tomorrow  - due to sedation, patient unable to answer question regarding dialysis access. Does appear to have AVF in LUE, however feels like clotted off. Will check with patient  again once he wake up more.  7. Partial blindness  Signed, Azalee Course, PA-C 06/25/2014, 11:39 AM   I have examined the patient and reviewed assessment and plan and discussed with patient.  Agree with above as stated.  Borderline ECG changes.  Active pain.  Was taken to cath.  Stent to cicumflex.  Was already on Plavix for PAD.  Keep on DAPT for at least a year. Secondary prevention as well.  He may prefer to f/u in [place], since that is where he lives.   Patient with anemia due to chronic renal disease.  Olesya Wike S.

## 2014-06-25 NOTE — CV Procedure (Addendum)
PROCEDURE:  Left heart catheterization with selective coronary angiography, left ventriculogram.  PCI left circumflex  INDICATIONS:  Non STEMI; Lateral wall MI  The risks, benefits, and details of the procedure were explained to the patient.  The patient verbalized understanding and wanted to proceed.  Emergency consent was obtained.  There were questionable ECG changes. They were not quite diagnostic for ST elevation MI. Due to the patient's ongoing discomfort and risk factors, he was taken to the Cath Lab emergently.  PROCEDURE TECHNIQUE:  The patient was premedicated for a contrast allergy. After Xylocaine anesthesia a 25F sheath was placed in the right femoral artery with a single anterior needle wall stick.   Femoral approach was used because the patient is end-stage renal. There was significant calcium in the right femoral artery which was visualized under fluoroscopy. This landmark was used for access as his pulse was poor. Left coronary angiography was done using a Judkins L5 guide catheter.  Right coronary angiography was done using a Judkins R4 guide catheter.  Left heart catheterization was done using a pigtail catheter.  PCI was performed. Please see below for details. Manual compression will be used for hemostasis.   CONTRAST:  Total of 140 cc.  COMPLICATIONS:  None.    HEMODYNAMICS:  Aortic pressure was 101/42; LV pressure was 104/12; LVEDP 15.  There was no gradient between the left ventricle and aorta.    ANGIOGRAPHIC DATA:   The left main coronary artery is widely patent. There is mild disease in the distal vessel.  The left anterior descending artery is a large vessel which wraps around the apex. There is mild, disease in several portions of the mid vessel. There are several small diagonal vessels which are all widely patent. The LAD gives off collaterals which feed the distal RCA system.  The left circumflex artery is a large vessel. There is mild disease in the midportion of  the vessel. The first obtuse marginal is small and patent. There is a large ramus vessel which is also widely patent. Just before the origin of the second obtuse marginal, there is an 80-90% stenosis. Disease extends into the second obtuse marginal. The remainder of the circumflex after the second marginal is medium sized..  The right coronary artery is subtotally occluded proximally. There is competitive flow in the mid to distal vessel. The PDA fills by brisk left to right collaterals.  LEFT VENTRICULOGRAM:  Left ventricular angiogram was not done.  LVEDP was 15 mmHg.  PCI NARRATIVE: ACLS 3.5 guiding catheter was used to engage the left main. IV heparin was used for anticoagulation as the patient was already on Plavix. A CT was used to check that the heparin was therapeutic. Given that he is end-stage renal, I did not want to use Angiomax. A pro-water wire was placed across the area disease into the true circumflex. A BMW wire was then placed into the obtuse marginal. A 2.5 x 12 balloon was inflated several times. High pressure was required to have a balloon to open fully. A 2.75 x 16 Promus drug-eluting stent was then deployed from the mid circumflex extending into the second obtuse marginal, thus jailing the remainder of the circumflex. The stent was postdilated with a 3.0 x 12 noncompliant balloon. There was no residual stenosis. There was TIMI-3 flow in the jailed, distal circumflex.  There was some plaque shift into the true circumflex, but TIMI-3 flow remained. The patient's chest discomfort improved.  IMPRESSIONS:  1. Widely patent left main  coronary artery. 2. Mild disease in the left anterior descending artery and its branches. 3. Mild disease in the proximal to mid left circumflex artery. 80-90% lesion in the mid circumflex at the origin of the second obtuse marginal. This was successfully treated with a 2.75 x 16 Promus drug-eluting stent, postdilated to greater than 3 mm in diameter  proximally. 4. Subtotally occluded proximal right coronary artery.  There is both antegrade flow and retrograde flow from left to right collaterals 5. Left ventricular systolic function not assessed.  LVEDP 15 mmHg.  Ejection fraction to be assessed by echocardiogram.  RECOMMENDATION:  We'll continue the patient on aspirin and Plavix. He will need to stay on this for at least a year. He was already taking dual antiplatelet therapy due to peripheral vascular disease.  Continue aggressive secondary prevention. Would plan for medical therapy of his RCA if he continues to have angina. If angina is refractory, could consider PCI.

## 2014-06-25 NOTE — Care Management Note (Addendum)
    Page 1 of 1   06/28/2014     10:38:48 AM CARE MANAGEMENT NOTE 06/28/2014  Patient:  [name],[name]   Account Number:  [id number]  Date Initiated:  06/25/2014  Documentation initiated by:  [name]  Subjective/Objective Assessment:   adm w mi     Action/Plan:   lives w wife   Anticipated DC Date:     Anticipated DC Plan:           Choice offered to / List presented to:             Status of service:  Completed, signed off Medicare Important Message given?  YES (If response is "NO", the following Medicare IM given date fields will be blank) Date Medicare IM given:  06/28/2014 Medicare IM given by:  [name] Date Additional Medicare IM given:   Additional Medicare IM given by:    Discharge Disposition:  SKILLED NURSING FACILITY  Per UR Regulation:  Reviewed for med. necessity/level of care/duration of stay  If discussed at Long Length of Stay Meetings, dates discussed:    Comments:

## 2014-06-26 DIAGNOSIS — I214 Non-ST elevation (NSTEMI) myocardial infarction: Principal | ICD-10-CM

## 2014-06-26 LAB — BASIC METABOLIC PANEL
Anion gap: 14 (ref 5–15)
BUN: 35 mg/dL — ABNORMAL HIGH (ref 6–23)
CO2: 31 mEq/L (ref 19–32)
Calcium: 8.8 mg/dL (ref 8.4–10.5)
Chloride: 92 mEq/L — ABNORMAL LOW (ref 96–112)
Creatinine, Ser: 5.45 mg/dL — ABNORMAL HIGH (ref 0.50–1.35)
GFR calc non Af Amer: 9 mL/min — ABNORMAL LOW (ref >90)
GFR, EST AFRICAN AMERICAN: 11 mL/min — AB (ref >90)
GLUCOSE: 199 mg/dL — AB (ref 70–99)
POTASSIUM: 5.2 meq/L (ref 3.7–5.3)
SODIUM: 137 meq/L (ref 137–147)

## 2014-06-26 LAB — LIPID PANEL
CHOL/HDL RATIO: 2.5 ratio
Cholesterol: 91 mg/dL (ref 0–200)
HDL: 37 mg/dL — ABNORMAL LOW (ref >39)
LDL Cholesterol: 44 mg/dL (ref 0–99)
Triglycerides: 49 mg/dL (ref <150)
VLDL: 10 mg/dL (ref 0–40)

## 2014-06-26 LAB — CBC
HCT: 26.5 % — ABNORMAL LOW (ref 39.0–52.0)
Hemoglobin: 8.4 g/dL — ABNORMAL LOW (ref 13.0–17.0)
MCH: 29.5 pg (ref 26.0–34.0)
MCHC: 31.7 g/dL (ref 30.0–36.0)
MCV: 93 fL (ref 78.0–100.0)
Platelets: 210 10*3/uL (ref 150–400)
RBC: 2.85 MIL/uL — AB (ref 4.22–5.81)
RDW: 16.4 % — AB (ref 11.5–15.5)
WBC: 9.9 10*3/uL (ref 4.0–10.5)

## 2014-06-26 LAB — TROPONIN I

## 2014-06-26 LAB — GLUCOSE, CAPILLARY
Glucose-Capillary: 182 mg/dL — ABNORMAL HIGH (ref 70–99)
Glucose-Capillary: 253 mg/dL — ABNORMAL HIGH (ref 70–99)
Glucose-Capillary: 201 mg/dL — ABNORMAL HIGH (ref 70–99)

## 2014-06-26 MED ORDER — LIDOCAINE HCL (PF) 1 % IJ SOLN: 5.0000 mL | INTRAMUSCULAR | Status: DC | PRN

## 2014-06-26 MED ORDER — SODIUM CHLORIDE 0.9 % IV SOLN: 100.0000 mL | INTRAVENOUS | Status: DC | PRN

## 2014-06-26 MED ORDER — DOXERCALCIFEROL 4 MCG/2ML IV SOLN
INTRAVENOUS | Status: AC
  Filled 2014-06-26: qty 2

## 2014-06-26 MED ORDER — PENTAFLUOROPROP-TETRAFLUOROETH EX AERO: 1.0000 "application " | INHALATION_SPRAY | CUTANEOUS | Status: DC | PRN

## 2014-06-26 MED ORDER — HEPARIN SODIUM (PORCINE) 1000 UNIT/ML DIALYSIS
20.0000 [IU]/kg | INTRAMUSCULAR | Status: DC | PRN
  Filled 2014-06-26: qty 2

## 2014-06-26 MED ORDER — DARBEPOETIN ALFA-POLYSORBATE 60 MCG/0.3ML IJ SOLN
INTRAMUSCULAR | Status: AC
  Administered 2014-06-26: 60 ug via INTRAVENOUS
  Filled 2014-06-26: qty 0.3

## 2014-06-26 MED ORDER — NEPRO/CARBSTEADY PO LIQD
237.0000 mL | ORAL | Status: DC | PRN
  Filled 2014-06-26: qty 237

## 2014-06-26 MED ORDER — LIDOCAINE-PRILOCAINE 2.5-2.5 % EX CREA
1.0000 "application " | TOPICAL_CREAM | CUTANEOUS | Status: DC | PRN
  Filled 2014-06-26: qty 5

## 2014-06-26 MED ORDER — HEPARIN SODIUM (PORCINE) 1000 UNIT/ML DIALYSIS
1000.0000 [IU] | INTRAMUSCULAR | Status: DC | PRN
  Filled 2014-06-26: qty 1

## 2014-06-26 MED ORDER — ALTEPLASE 2 MG IJ SOLR
2.0000 mg | Freq: Once | INTRAMUSCULAR | Status: AC | PRN
  Filled 2014-06-26: qty 2

## 2014-06-26 NOTE — Progress Notes (Signed)
Subjective:  No CP/SOB  Objective:  Temp:  [97.2 F (36.2 C)-98.8 F (37.1 C)] 97.2 F (36.2 C) (10/28 1211) Pulse Rate:  [60-84] 71 (10/28 1300) Resp:  [10-17] 15 (10/28 1300) BP: (82-167)/(44-68) 108/45 mmHg (10/28 1300) SpO2:  [100 %] 100 % (10/28 1300) Weight:  [214 lb 4.6 oz (97.2 kg)-216 lb 7.9 oz (98.2 kg)] 214 lb 4.6 oz (97.2 kg) (10/28 1211) Weight change:   Intake/Output from previous day: 10/27 0701 - 10/28 0700 In: 120 [P.O.:120] Out: -   Intake/Output from this shift: Total I/O In: -  Out: 298 [Other:298]  Physical Exam: General appearance: alert and no distress Neck: no adenopathy, no carotid bruit, no JVD, supple, symmetrical, trachea midline and thyroid not enlarged, symmetric, no tenderness/mass/nodules Lungs: clear to auscultation bilaterally Heart: regular rate and rhythm, S1, S2 normal, no murmur, click, rub or gallop Extremities: extremities normal, atraumatic, no cyanosis or edema  Lab Results: Results for orders placed during the hospital encounter of 06/25/14 (from the past 48 hour(s))  POCT ACTIVATED CLOTTING TIME     Status: None   Collection Time    06/25/14  9:12 AM      Result Value Ref Range   Activated Clotting Time 236    POCT ACTIVATED CLOTTING TIME     Status: None   Collection Time    06/25/14  9:31 AM      Result Value Ref Range   Activated Clotting Time 259    GLUCOSE, CAPILLARY     Status: Abnormal   Collection Time    06/25/14 10:28 AM      Result Value Ref Range   Glucose-Capillary 132 (*) 70 - 99 mg/dL  POCT ACTIVATED CLOTTING TIME     Status: None   Collection Time    06/25/14 11:21 AM      Result Value Ref Range   Activated Clotting Time 185    POCT ACTIVATED CLOTTING TIME     Status: None   Collection Time    06/25/14 11:51 AM      Result Value Ref Range   Activated Clotting Time 169    MRSA PCR SCREENING     Status: None   Collection Time    06/25/14  1:31 PM      Result Value Ref Range   MRSA by PCR  NEGATIVE  NEGATIVE   Comment:            The GeneXpert MRSA Assay (FDA     approved for NASAL specimens     only), is one component of a     comprehensive MRSA colonization     surveillance program. It is not     intended to diagnose MRSA     infection nor to guide or     monitor treatment for     MRSA infections.  TROPONIN I     Status: None   Collection Time    06/25/14  2:57 PM      Result Value Ref Range   Troponin I <0.30  <0.30 ng/mL   Comment:            Due to the release kinetics of cTnI,     a negative result within the first hours     of the onset of symptoms does not rule out     myocardial infarction with certainty.     If myocardial infarction is still suspected,     repeat the test at appropriate  intervals.  GLUCOSE, CAPILLARY     Status: Abnormal   Collection Time    06/25/14  5:15 PM      Result Value Ref Range   Glucose-Capillary 217 (*) 70 - 99 mg/dL   Comment 1 Capillary Sample    TROPONIN I     Status: None   Collection Time    06/25/14  7:22 PM      Result Value Ref Range   Troponin I <0.30  <0.30 ng/mL   Comment:            Due to the release kinetics of cTnI,     a negative result within the first hours     of the onset of symptoms does not rule out     myocardial infarction with certainty.     If myocardial infarction is still suspected,     repeat the test at appropriate intervals.  GLUCOSE, CAPILLARY     Status: Abnormal   Collection Time    06/25/14  9:03 PM      Result Value Ref Range   Glucose-Capillary 252 (*) 70 - 99 mg/dL   Comment 1 Capillary Sample    TROPONIN I     Status: None   Collection Time    06/26/14 12:54 AM      Result Value Ref Range   Troponin I <0.30  <0.30 ng/mL   Comment:            Due to the release kinetics of cTnI,     a negative result within the first hours     of the onset of symptoms does not rule out     myocardial infarction with certainty.     If myocardial infarction is still suspected,     repeat  the test at appropriate intervals.  BASIC METABOLIC PANEL     Status: Abnormal   Collection Time    06/26/14  3:30 AM      Result Value Ref Range   Sodium 137  137 - 147 mEq/L   Potassium 5.2  3.7 - 5.3 mEq/L   Chloride 92 (*) 96 - 112 mEq/L   CO2 31  19 - 32 mEq/L   Glucose, Bld 199 (*) 70 - 99 mg/dL   BUN 35 (*) 6 - 23 mg/dL   Creatinine, Ser 5.45 (*) 0.50 - 1.35 mg/dL   Calcium 8.8  8.4 - 10.5 mg/dL   GFR calc non Af Amer 9 (*) >90 mL/min   GFR calc Af Amer 11 (*) >90 mL/min   Comment: (NOTE)     The eGFR has been calculated using the CKD EPI equation.     This calculation has not been validated in all clinical situations.     eGFR's persistently <90 mL/min signify possible Chronic Kidney     Disease.   Anion gap 14  5 - 15  CBC     Status: Abnormal   Collection Time    06/26/14  3:30 AM      Result Value Ref Range   WBC 9.9  4.0 - 10.5 K/uL   RBC 2.85 (*) 4.22 - 5.81 MIL/uL   Hemoglobin 8.4 (*) 13.0 - 17.0 g/dL   HCT 26.5 (*) 39.0 - 52.0 %   MCV 93.0  78.0 - 100.0 fL   MCH 29.5  26.0 - 34.0 pg   MCHC 31.7  30.0 - 36.0 g/dL   RDW 16.4 (*) 11.5 - 15.5 %   Platelets 210  150 - 400 K/uL  LIPID PANEL     Status: Abnormal   Collection Time    06/26/14  3:55 AM      Result Value Ref Range   Cholesterol 91  0 - 200 mg/dL   Triglycerides 49  <150 mg/dL   HDL 37 (*) >39 mg/dL   Total CHOL/HDL Ratio 2.5     VLDL 10  0 - 40 mg/dL   LDL Cholesterol 44  0 - 99 mg/dL   Comment:            Total Cholesterol/HDL:CHD Risk     Coronary Heart Disease Risk Table                         Men   Women      1/2 Average Risk   3.4   3.3      Average Risk       5.0   4.4      2 X Average Risk   9.6   7.1      3 X Average Risk  23.4   11.0                Use the calculated Patient Ratio     above and the CHD Risk Table     to determine the patient's CHD Risk.                ATP III CLASSIFICATION (LDL):      <100     mg/dL   Optimal      100-129  mg/dL   Near or Above                         Optimal      130-159  mg/dL   Borderline      160-189  mg/dL   High      >190     mg/dL   Very High    Imaging: Imaging results have been reviewed  Tele: NSR  Assessment/Plan:   1. Active Problems: 2.   Acute MI, lateral wall, initial episode of care 3.   ESRD (end stage renal disease) 4.   NSTEMI (non-ST elevated myocardial infarction) 5.   Time Spent Directly with Patient:  20 minutes  Length of Stay:  LOS: 1 day   POD #1 NSTEMI, DES LCX by Dr. Irish Lack. Nl LV FXN. DAPT. No CP/SOB. Exam benign. Had HD today. On approp meds. OK to Tx tele. Prom home 24-48 hrs.  Lorretta Harp 06/26/2014, 1:47 PM

## 2014-06-26 NOTE — Consult Note (Signed)
WOC wound consult note Reason for Consult: evaluation of right heel and right leg wound. Pt [facility] but [facility] He states he is still under the care of a MD for his wound.  Hard to determine much history otherwise.  Wound type: Stage III Pressure Ulcer right heel; Unclear etiology of the right lateral leg wound, may be pressure but unable to determine from patient report.  Pressure Ulcer POA: Yes Measurement: R lateral leg: 6cm x 1.5cm x 0.2cm  R heel: 1.0cm x 1.0cm x 0.5cm  Wound bed: Right lateral: 100% granulation tissue Right heel: pink, moist, non granular Drainage (amount, consistency, odor) minimal to moderate serosanguinous heel and lateral leg Periwound: intact, but macerated on the right heel  Dressing procedure/placement/frequency: Silver hydrofiber packed into the right heel and applied to the right lateral leg. Cover with dry dressing change every other day. FU with wound care center or MD at dc for wound care.   Discussed POC with patient and bedside nurse.  Re consult if needed, will not follow at this time. Thanks  Antwian Santaana Foot Lockerustin RN, CWOCN [phone])

## 2014-06-26 NOTE — Progress Notes (Signed)
Notified Md about pt's cbg = 252 with no SSI coverage.  New orders received.  Also pt has wound on right lower leg and heel.    Wound consult order placed.  Will continue to monitor. Karena Addisonoro, Alyssamae Klinck T

## 2014-06-26 NOTE — Procedures (Signed)
I was present at this dialysis session. I have reviewed the session itself and made appropriate changes.     Under EDW today which is 99kg, Goal 1L UF.  Eating well.  No c/o.    Sabra Heckyan Sanford  MD 06/26/2014, 8:58 AM

## 2014-06-26 NOTE — Clinical Documentation Improvement (Signed)
Please specify diagnosis related to below supporting information if appropriate.   Possible Clinical Conditions Acute Blood Loss Anemia Chronic Anemia secondary to renal disesase Acute on chronic blood loss anemia Other Condition Cannot Clinically Determine   Supporting Information:  Patient has ESRD with HD 3 x's a week.  Per 06/25/14 consult note: An[facility] - stable - continue weekly ESA - due Wed.     Patient receives ferrous sulfate 325 (65 FE) MG tablet  Daily.    Pt's labs during this admission   Component     Latest Ref Rng 06/26/2014          Hemoglobin     13.0 - 17.0 g/dL 8.4 (L)  HCT     16.139.0 - 52.0 % 26.5 (L)    Thank You, Elmer Pickerarlene Joseeduardo Brix RN, BSN, CCM Clinical Documentation Specialist Phone: [phone]

## 2014-06-26 NOTE — Progress Notes (Signed)
PT Cancellation Note  Patient Details Name: [name] ArabianBurnice Lee Chenette MRN: [id number] DOB: 01-09-1935   Cancelled Treatment:    Reason Eval/Treat Not Completed: Patient at procedure or test/unavailable   Cassie Shedlock F 06/26/2014, 8:52 AM

## 2014-06-26 NOTE — Progress Notes (Signed)
1430- 1500 Received order to see pt. Talked with pt who stated he had not walked since December and a lift was used to get him to chair. Called PT and they will see him tomorrow. We will follow and let them evaluate. Luetta Nuttingharlene Yuliet Needs RN BSN 06/26/2014 2:57 PM

## 2014-06-27 ENCOUNTER — Encounter (HOSPITAL_COMMUNITY): Payer: Self-pay | Admitting: *Deleted

## 2014-06-27 DIAGNOSIS — I517 Cardiomegaly: Secondary | ICD-10-CM

## 2014-06-27 LAB — GLUCOSE, CAPILLARY
Glucose-Capillary: 71 mg/dL (ref 70–99)
Glucose-Capillary: 135 mg/dL — ABNORMAL HIGH (ref 70–99)
Glucose-Capillary: 198 mg/dL — ABNORMAL HIGH (ref 70–99)
Glucose-Capillary: 193 mg/dL — ABNORMAL HIGH (ref 70–99)

## 2014-06-27 NOTE — Progress Notes (Signed)
OT Cancellation Note  Patient Details Name: [name] MRN: [id number] DOB: 1935-05-03   Cancelled Treatment:    Reason Eval/Treat Not Completed: Other (comment) Pt is from SNF [facility]) and has Medicare and current D/C plan is return to SNF. No apparent immediate acute care OT needs, therefore will defer OT to SNF. If OT eval is needed please call Acute Rehab Dept. at [phone] or text page OT at [phone].    Nena JordanMiller, Harumi Yamin M  Carney LivingLeeAnn Marie Orlo [name], OTR/L Occupational Therapist [phone] (pager)  06/27/2014, 1:35 PM

## 2014-06-27 NOTE — Progress Notes (Signed)
Patient Profile: 79 year old African-American male with past medical history of hypertension, peripheral vascular disease on DAPT, and history of end-stage renal disease on hemodialysis M,W,F [facility] for urgent cardiac catheterization in the setting of NSTEMI.    Subjective: Currently chest pain free, but he has experienced mild, intermittent substernal chest tightness since his PCI 2 days ago. No other symptoms.   Objective: Vital signs in last 24 hours: Temp:  [97.2 F (36.2 C)-99.1 F (37.3 C)] 98.3 F (36.8 C) (10/29 0550) Pulse Rate:  [63-84] 76 (10/29 0550) Resp:  [12-24] 15 (10/29 0200) BP: (82-143)/(43-68) 107/55 mmHg (10/29 0550) SpO2:  [98 %-100 %] 98 % (10/29 0550) Weight:  [214 lb 4.6 oz (97.2 kg)-218 lb 1.6 oz (98.93 kg)] 218 lb 1.6 oz (98.93 kg) (10/29 0550)    Intake/Output from previous day: 10/28 0701 - 10/29 0700 In: 460 [P.O.:460] Out: 298  Intake/Output this shift:    Medications Current Facility-Administered Medications  Medication Dose Route Frequency Provider Last Rate Last Dose  . 0.9 %  sodium chloride infusion  250 mL Intravenous PRN Corky CraftsJayadeep S Varanasi, MD      . 0.9 %  sodium chloride infusion  100 mL Intravenous PRN Sheffield SliderMartha B. Bergman, PA-C      . 0.9 %  sodium chloride infusion  100 mL Intravenous PRN Sheffield SliderMartha B. Bergman, PA-C      . acetaminophen (TYLENOL) tablet 650 mg  650 mg Oral Q4H PRN Corky CraftsJayadeep S Varanasi, MD      . albuterol (PROVENTIL) (2.5 MG/3ML) 0.083% nebulizer solution 2.5 mg  2.5 mg Inhalation Q6H PRN Corky CraftsJayadeep S Varanasi, MD      . amLODipine (NORVASC) tablet 2.5 mg  2.5 mg Oral Daily Corky CraftsJayadeep S Varanasi, MD   2.5 mg at 06/26/14 1346  . aspirin chewable tablet 81 mg  81 mg Oral Daily Corky CraftsJayadeep S Varanasi, MD   81 mg at 06/26/14 1355  . cinacalcet (SENSIPAR) tablet 60 mg  60 mg Oral Q breakfast Corky CraftsJayadeep S Varanasi, MD   60 mg at 06/26/14 1346  . clopidogrel (PLAVIX) tablet 75 mg  75 mg Oral Q  breakfast Corky CraftsJayadeep S Varanasi, MD   75 mg at 06/26/14 1348  . darbepoetin (ARANESP) injection 60 mcg  60 mcg Intravenous Q Wed-HD Sheffield SliderMartha B. Bergman, PA-C   60 mcg at 06/26/14 [id number]  . doxercalciferol (HECTOROL) injection 1 mcg  1 mcg Intravenous Q M,W,F-HD Sheffield SliderMartha B. Bergman, PA-C   1 mcg at 06/26/14 0902  . feeding supplement (NEPRO CARB STEADY) liquid 237 mL  237 mL Oral BID BM Corky CraftsJayadeep S Varanasi, MD   237 mL at 06/25/14 1500  . feeding supplement (NEPRO CARB STEADY) liquid 237 mL  237 mL Oral PRN Sheffield SliderMartha B. Bergman, PA-C      . glimepiride (AMARYL) tablet 4 mg  4 mg Oral Q breakfast Corky CraftsJayadeep S Varanasi, MD   4 mg at 06/26/14 1348  . heparin injection 1,000 Units  1,000 Units Dialysis PRN Sheffield SliderMartha B. Bergman, PA-C      . heparin injection 2,000 Units  20 Units/kg Dialysis PRN Sheffield SliderMartha B. Bergman, PA-C      . insulin aspart (novoLOG) injection 0-15 Units  0-15 Units Subcutaneous TID WC Quintella Reichertraci R Turner, MD   8 Units at 06/26/14 1823  . insulin aspart (novoLOG) injection 0-5 Units  0-5 Units Subcutaneous QHS Quintella Reichertraci R Turner, MD   2 Units at 06/26/14 2117  . insulin glargine (LANTUS) injection 11 Units  11 Units Subcutaneous q morning - 10a Corky CraftsJayadeep S Varanasi, MD   11 Units at 06/26/14 1350  . lidocaine (PF) (XYLOCAINE) 1 % injection 5 mL  5 mL Intradermal PRN Sheffield SliderMartha B. Bergman, PA-C      . lidocaine-prilocaine (EMLA) cream 1 application  1 application Topical PRN Sheffield SliderMartha B. Bergman, PA-C      . losartan (COZAAR) tablet 25 mg  25 mg Oral Daily Corky CraftsJayadeep S Varanasi, MD   25 mg at 06/26/14 1347  . metoprolol (LOPRESSOR) tablet 50 mg  50 mg Oral BID Corky CraftsJayadeep S Varanasi, MD   50 mg at 06/26/14 1347  . multivitamin (RENA-VIT) tablet 1 tablet  1 tablet Oral Daily Corky CraftsJayadeep S Varanasi, MD   1 tablet at 06/26/14 1348  . nitroGLYCERIN (NITROSTAT) SL tablet 0.4 mg  0.4 mg Sublingual Q5 Min x 3 PRN Azalee CourseHao Meng, PA      . ondansetron (ZOFRAN) injection 4 mg  4 mg Intravenous Q6H PRN Corky CraftsJayadeep S Varanasi, MD      . oxyCODONE  (Oxy IR/ROXICODONE) immediate release tablet 5 mg  5 mg Oral Q6H PRN Corky CraftsJayadeep S Varanasi, MD      . pentafluoroprop-tetrafluoroeth Peggye Pitt(GEBAUERS) aerosol 1 application  1 application Topical PRN Sheffield SliderMartha B. Bergman, PA-C      . pravastatin (PRAVACHOL) tablet 20 mg  20 mg Oral QPC supper Corky CraftsJayadeep S Varanasi, MD   20 mg at 06/26/14 1820  . sodium chloride 0.9 % injection 3 mL  3 mL Intravenous Q12H Corky CraftsJayadeep S Varanasi, MD   3 mL at 06/26/14 2200  . sodium chloride 0.9 % injection 3 mL  3 mL Intravenous PRN Corky CraftsJayadeep S Varanasi, MD        PE: General appearance: alert, cooperative and no distress Neck: no JVD Lungs: clear to auscultation bilaterally Heart: regular rate and rhythm and 2/6 SM Extremities: no LEE Pulses: 2+ and symmetric Skin: warm and dry Neurologic: Grossly normal  Lab Results:   Recent Labs  06/26/14 0330  WBC 9.9  HGB 8.4*  HCT 26.5*  PLT 210   BMET  Recent Labs  06/26/14 0330  NA 137  K 5.2  CL 92*  CO2 31  GLUCOSE 199*  BUN 35*  CREATININE 5.45*  CALCIUM 8.8   PT/INR No results found for this basename: LABPROT, INR,  in the last 72 hours Cholesterol  Recent Labs  06/26/14 0355  CHOL 91    Assessment/Plan  Active Problems:   Acute MI, lateral wall, initial episode of care   ESRD (end stage renal disease)   NSTEMI (non-ST elevated myocardial infarction)  1. Lateral NSTEMI: cath 06/25/2014 80-90% mid LCx treated with DES, subtotally occluded prox RCA. LV function not assessed by cath. Echo recommended but not completed. Will order prior to discharge. Still with mild, intermittent CP. Continue ASA, Plavix, BB, ARB. BP may not support addition of LA nitrate, especially on HD days. ? If anemia is contributing to CP (Hgb 8.4).  2. ESRD: Nephrology following. HD M,W, F  3.  HTN: BP well controlled.   4. Anemia: Hgb 8.4. Per nephrology, continue weekly ESA.     LOS: 2 days    Brittainy M. Delmer IslamSimmons, PA-C 06/27/2014 7:52 AM  I have personally  seen and examined this patient with Robbie LisBrittainy Simmons, PA-C. I agree with the assessment and plan as outlined above. Doing well post NSTEMI, DES Circumflex. Echo today. Continue current cardiac meds. Nephrology following. I would anticipate d/c home tomorrow after HD.   Adonijah Baena,  MD 06/27/2014 10:00 AM

## 2014-06-27 NOTE — Clinical Social Work Psychosocial (Signed)
     Clinical Social Work Department BRIEF PSYCHOSOCIAL ASSESSMENT 06/27/2014  Patient:  [name],Evin LEE     Account Number:  [id number]     Admit date:  06/25/2014  Clinical Social Worker:  Harless NakayamaAMBELAL,Larenzo Caples, LCSWA  Date/Time:  06/27/2014 10:39 AM  Referred by:  Physician  Date Referred:  06/27/2014 Referred for  SNF Placement   Other Referral:   Interview type:  Patient Other interview type:    PSYCHOSOCIAL DATA Living Status:  FACILITY Admitted from facility:  [facility] Level of care:  Skilled Nursing Facility Primary support name:  Eulogio DitchHazel [place] Primary support relationship to patient:  SPOUSE Degree of support available:   Pt has good support    CURRENT CONCERNS Current Concerns  Post-Acute Placement   Other Concerns:    SOCIAL WORK ASSESSMENT / PLAN CSW made aware that pt was admitted from facility. CSW spoke with pt and he confirmed he was admitted from [facility]. Pt informed CSW the plan was for ST rehab but he has been there longer than expected. Pt notified CSW that at dc he plans to return to facility at dc but eventually he does plan to return home with his wife. Pt reports he does [facility] but he just was not planning on having to be there so long. Pt informed CSW that he would talk to his wife but requested that CSW please call his wife when he discharges tomorrow. CSW agreed to do so. Pt informed CSW he will need non-emergent ambulance.   Assessment/plan status:  Psychosocial Support/Ongoing Assessment of Needs Other assessment/ plan:   Information/referral to community resources:   None needed    PATIENTS/FAMILYS RESPONSE TO PLAN OF CARE: Pt pleasant and coopeartive. Pt is agreeable to returning to SNF.    Caetano Oberhaus, LCSWA  [phone]

## 2014-06-27 NOTE — Evaluation (Signed)
Physical Therapy Evaluation Patient Details Name: [name] MRN: [id number] DOB: 16-Jul-1935 Today's Date: 06/27/2014   History of Present Illness    79 year old African-American male with past medical history of hypertension, peripheral vascular disease on DAPT, and history of end-stage renal disease on hemodialysis M,W,[name] tranferred from [place] to [facility] on 06/25/14 for urgent cardiac catheterization in the setting of NSTEMI.      Clinical Impression  Pt admitted with above. Pt currently with functional limitations due to the deficits listed below (see PT Problem List). [facility] and spoke with nurse, Crystal who cared for pt at [facility].  She states they used Huntley DecSara Plus to get pt OOB.  She states pt was total care for B/D.  Pt states he bathed his upper body.  Feel that pt will benefit from 2x week trial of PT to incr strength and endurance.   Pt will benefit from skilled PT to increase their independence and safety with mobility to allow discharge to the venue listed below.      Follow Up Recommendations SNF;Supervision/Assistance - 24 hour    Equipment Recommendations  None recommended by PT    Recommendations for Other Services       Precautions / Restrictions Precautions Precautions: Fall Restrictions Weight Bearing Restrictions: No      Mobility  Bed Mobility Overal bed mobility: Needs Assistance Bed Mobility: Supine to Sit     Supine to sit: Min guard     General bed mobility comments: Pt able to bring LEs off bed as well as elevate trunk.    Transfers Overall transfer level: Needs assistance Equipment used: Ambulation equipment used Huntley Dec(Sara Plus) Transfers: Sit to/from [employer];Stand Pivot Transfers Sit to Stand: +2 physical assistance;Mod assist;From elevated surface Stand pivot transfers: Total assist;+2 physical assistance       General transfer comment: Used Huntley DecSara plus for pt to stand and for pivot with foot plate in place. Pt was able to  assist machine and stand close to upright with cues to extend hips and knees to stand more fully upright.  Pt stood long enough to be cleaned as well as to move Huntley DecSara plus to recliner ~4 minutes.      Ambulation/Gait                Stairs            Wheelchair Mobility    Modified Rankin (Stroke Patients Only)       Balance Overall balance assessment: Needs assistance;History of Falls Sitting-balance support: No upper extremity supported;Feet supported Sitting balance-Leahy Scale: Good     Standing balance support: Bilateral upper extremity supported;During functional activity Standing balance-Leahy Scale: Poor Standing balance comment: Pt requires Huntley DecSara plus for balance but is able to stand close to fully upright in machine.                               Pertinent Vitals/Pain Pain Assessment: No/denies pain VSS    Home Living Family/patient expects to be discharged to:: Skilled nursing facility                      Prior Function Level of Independence: Needs assistance   Gait / Transfers Assistance Needed[facility] DecSara plus [facility]  ADL's / Homemaking Assistance Needed: Pt performs upper body bathing and staff performs lower body.  Dressing total assist by staff  Hand Dominance        Extremity/Trunk Assessment   Upper Extremity Assessment: Defer to OT evaluation           Lower Extremity Assessment: RLE deficits/detail;LLE deficits/detail RLE Deficits / Details: inverted at ankle.  tight heel cords with 2/5 strength in ankles, knees 3-/5, hips 3-/5 LLE Deficits / Details: strength grossly 3-/5.    Cervical / Trunk Assessment: Kyphotic  Communication   Communication: No difficulties  Cognition Arousal/Alertness: Awake/alert Behavior During Therapy: WFL for tasks assessed/performed Overall Cognitive Status: History of cognitive impairments - at baseline                      General Comments       Exercises General Exercises - Lower Extremity Ankle Circles/Pumps: AAROM;Both;5 reps;Seated Long Arc Quad: AROM;Both;10 reps;Seated Hip Flexion/Marching: AROM;Both;10 reps;Seated      Assessment/Plan    PT Assessment Patient needs continued PT services  PT Diagnosis Generalized weakness   PT Problem List Decreased activity tolerance;Decreased balance;Decreased strength;Decreased mobility;Decreased knowledge of use of DME;Decreased safety awareness;Decreased knowledge of precautions  PT Treatment Interventions DME instruction;Functional mobility training;Therapeutic activities;Therapeutic exercise;Balance training;Patient/family education   PT Goals (Current goals can be found in the Care Plan section) Acute Rehab PT Goals Patient Stated Goal: to go back to NH PT Goal Formulation: With patient Time For Goal Achievement: 07/11/14 Potential to Achieve Goals: Good    Frequency Min 2X/week   Barriers to discharge Decreased caregiver support      Co-evaluation               End of Session Equipment Utilized During Treatment: Gait belt Activity Tolerance: Patient limited by fatigue Patient left: in chair;with call bell/phone within reach Nurse Communication: Mobility status;Need for lift equipment         Time: 0912-0943 PT Time Calculation (min): 31 min   Charges:   PT Evaluation $Initial PT Evaluation Tier I: 1 Procedure PT Treatments $Therapeutic Exercise: 8-22 mins $Therapeutic Activity: 8-22 mins   PT G CodesBerline [name]:          [name] 06/27/2014, 11:36 AM [name],PT Acute Rehabilitation [phone] (pager)

## 2014-06-27 NOTE — Progress Notes (Signed)
  Echocardiogram 2D Echocardiogram has been performed.  Arvil ChacoFoster, Davidjames Blansett 06/27/2014, 3:40 PM

## 2014-06-27 NOTE — Progress Notes (Signed)
Admit: 06/25/2014 LOS: 2  59M ESRD MWF [facility] for NSTEMI s/p PCI 06/25/14  Subjective:  No new events  Feels well Denies CP Some SOB, esp with exertion and lying flat HD yesetrday, 0.3L UF, post BP 98.9kg  10/28 0701 - 10/29 0700 In: 460 [P.O.:460] Out: 298   Filed Weights   06/26/14 0755 06/26/14 1211 06/27/14 0550  Weight: 98.2 kg (216 lb 7.9 oz) 97.2 kg (214 lb 4.6 oz) 98.93 kg (218 lb 1.6 oz)    Scheduled Meds: . amLODipine  2.5 mg Oral Daily  . aspirin  81 mg Oral Daily  . cinacalcet  60 mg Oral Q breakfast  . clopidogrel  75 mg Oral Q breakfast  . darbepoetin (ARANESP) injection - DIALYSIS  60 mcg Intravenous Q Wed-HD  . doxercalciferol  1 mcg Intravenous Q M,W,F-HD  . feeding supplement (NEPRO CARB STEADY)  237 mL Oral BID BM  . glimepiride  4 mg Oral Q breakfast  . insulin aspart  0-15 Units Subcutaneous TID WC  . insulin aspart  0-5 Units Subcutaneous QHS  . insulin glargine  11 Units Subcutaneous q morning - 10a  . losartan  25 mg Oral Daily  . metoprolol  50 mg Oral BID  . multivitamin  1 tablet Oral Daily  . pravastatin  20 mg Oral QPC supper  . sodium chloride  3 mL Intravenous Q12H   Continuous Infusions:  PRN Meds:.sodium chloride, sodium chloride, sodium chloride, acetaminophen, albuterol, feeding supplement (NEPRO CARB STEADY), heparin, heparin, lidocaine (PF), lidocaine-prilocaine, nitroGLYCERIN, ondansetron (ZOFRAN) IV, oxyCODONE, pentafluoroprop-tetrafluoroeth, sodium chloride  Current Labs: reviewed    Physical Exam:  Blood pressure 107/55, pulse 76, temperature 98.3 F (36.8 C), temperature source Oral, resp. rate 15, height 5\' 8"  (1.727 m), weight 98.93 kg (218 lb 1.6 oz), SpO2 98.00%. General: Well developed, well nourished, in no acute distress breathing easily  Head: Normocephalic, atraumatic, sclera non-icteric, mucus membranes are moist  Neck: Supple. JVD not elevated.  Lungs: Clear bilaterally to auscultation  without wheezes, rales, or rhonchi. Breathing is unlabored.  Heart: RRR with S1 S2. No murmurs, rubs, or gallops appreciated.  Abdomen: Soft, non-tender, non-distended with normoactive bowel sounds. No rebound/guarding. No obvious abdominal masses.  M-S: Strength and tone appear normal for age.  Lower extremities: right lower without significant edema though hard to assess due to RLE wounds that are dressed; s/p 4th and 5th right toe amp well healed; LLE no edema or open wounds  Neuro: Sleeping soundly - rouses but very very drowsy and falls back asleep  Dialysis Access: left upper AVGG + bruit  DIALYSIS ORDERS Center: [facility] .  4 hr 180 450/800 EDW 99 2K 2 Ca left upper AVGG no profile  Aranesp 60 - just ^ 10/21 Hectorol 1 heaprin 5000 with 1000 mid tmt  Recent outpt labs: Hgb 8.8 10/21, 8.4 10/14, 9.9 10./07 ferritin 1484 Sept iPTH 451 Sept tsat 21% 10/15, Ca and P controlled  Assessment 1. ACS/NSTEMI s/p PCI 06/25/14 2. ESRD MWF [facility] via AVG 3. HTN/Vol, some persistent SOB on exertion and orthopnea 4. 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 PVD w/ chronic LE wounds: local care  Plan 1. Next HD tomorrow 2. Challenge EDW 1kg since having persistent dyspnea 3. Cont ESA, no indication for IV Fe 4. No inpatient renal issues  Sabra Heckyan Stella Encarnacion MD 06/27/2014, 10:19 AM   Recent Labs Lab 06/26/14 0330  NA  137  K 5.2  CL 92*  CO2 31  GLUCOSE 199*  BUN 35*  CREATININE 5.45*  CALCIUM 8.8    Recent Labs Lab 06/26/14 0330  WBC 9.9  HGB 8.4*  HCT 26.5*  MCV 93.0  PLT 210

## 2014-06-27 NOTE — Progress Notes (Signed)
Noted that pt is nonambulatory at SNF. Discussed MI, stent, Plavix/ASA and NTG. Voiced understanding. Gave MI and stent books/card. Will sign off. [id number] Ethelda ChickKristan Tresean Mattix CES, ACSM 06/27/2014 3:09 PM

## 2014-06-28 ENCOUNTER — Encounter (HOSPITAL_COMMUNITY): Payer: Self-pay | Admitting: Physician Assistant

## 2014-06-28 DIAGNOSIS — E1129 Type 2 diabetes mellitus with other diabetic kidney complication: Secondary | ICD-10-CM

## 2014-06-28 DIAGNOSIS — D631 Anemia in chronic kidney disease: Secondary | ICD-10-CM

## 2014-06-28 DIAGNOSIS — N189 Chronic kidney disease, unspecified: Secondary | ICD-10-CM

## 2014-06-28 DIAGNOSIS — I10 Essential (primary) hypertension: Secondary | ICD-10-CM

## 2014-06-28 LAB — PREPARE RBC (CROSSMATCH)

## 2014-06-28 LAB — ABO/RH: ABO/RH(D): O POS

## 2014-06-28 LAB — CBC
HEMATOCRIT: 23.8 % — AB (ref 39.0–52.0)
HEMOGLOBIN: 7.3 g/dL — AB (ref 13.0–17.0)
MCH: 28.7 pg (ref 26.0–34.0)
MCHC: 30.7 g/dL (ref 30.0–36.0)
MCV: 93.7 fL (ref 78.0–100.0)
Platelets: 222 10*3/uL (ref 150–400)
RBC: 2.54 MIL/uL — AB (ref 4.22–5.81)
RDW: 16.7 % — ABNORMAL HIGH (ref 11.5–15.5)
WBC: 7.2 10*3/uL (ref 4.0–10.5)

## 2014-06-28 LAB — BASIC METABOLIC PANEL
Anion gap: 14 (ref 5–15)
BUN: 36 mg/dL — ABNORMAL HIGH (ref 6–23)
CHLORIDE: 94 meq/L — AB (ref 96–112)
CO2: 29 meq/L (ref 19–32)
Calcium: 8.7 mg/dL (ref 8.4–10.5)
Creatinine, Ser: 4.77 mg/dL — ABNORMAL HIGH (ref 0.50–1.35)
GFR calc non Af Amer: 11 mL/min — ABNORMAL LOW (ref >90)
GFR, EST AFRICAN AMERICAN: 12 mL/min — AB (ref >90)
Glucose, Bld: 77 mg/dL (ref 70–99)
POTASSIUM: 3.5 meq/L — AB (ref 3.7–5.3)
Sodium: 137 mEq/L (ref 137–147)

## 2014-06-28 LAB — GLUCOSE, CAPILLARY
GLUCOSE-CAPILLARY: 46 mg/dL — AB (ref 70–99)
Glucose-Capillary: 74 mg/dL (ref 70–99)
Glucose-Capillary: 60 mg/dL — ABNORMAL LOW (ref 70–99)

## 2014-06-28 MED ORDER — SODIUM CHLORIDE 0.9 % IV SOLN: Freq: Once | INTRAVENOUS | Status: DC

## 2014-06-28 MED ORDER — LOSARTAN POTASSIUM 25 MG PO TABS: 25.0000 mg | ORAL_TABLET | Freq: Every day | ORAL | Status: DC

## 2014-06-28 MED ORDER — DOXERCALCIFEROL 4 MCG/2ML IV SOLN
INTRAVENOUS | Status: AC
  Filled 2014-06-28: qty 2

## 2014-06-28 NOTE — Procedures (Signed)
I was present at this dialysis session. I have reviewed the session itself and made appropriate changes.   Hb at 7.3 today.  Transfuse 1u pRBC.  Challenge EDW 1kg today.    [name] Heckyan [name]  MD 06/28/2014, 2:42 PM

## 2014-06-28 NOTE — Discharge Instructions (Signed)
PLEASE REMEMBER TO BRING ALL OF YOUR MEDICATIONS TO EACH OF YOUR FOLLOW-UP OFFICE VISITS. ° °PLEASE ATTEND ALL SCHEDULED FOLLOW-UP APPOINTMENTS.  ° °Activity: Increase activity slowly as tolerated. You may shower, but no soaking baths (or swimming) for 1 week. No driving for 2 days. No lifting over 5 lbs for 1 week. No sexual activity for 1 week.  ° °You May Return to Work: in 1 week (if applicable) ° °Wound Care: You may wash cath site gently with soap and water. Keep cath site clean and dry. If you notice pain, swelling, bleeding or pus at your cath site, please call [phone]. ° ° ° °Cardiac Cath Site Care °Refer to this sheet in the next few weeks. These instructions provide you with information on caring for yourself after your procedure. Your caregiver may also give you more specific instructions. Your treatment has been planned according to current medical practices, but problems sometimes occur. Call your caregiver if you have any problems or questions after your procedure. °HOME CARE INSTRUCTIONS °· You may shower 24 hours after the procedure. Remove the bandage (dressing) and gently wash the site with plain soap and water. Gently pat the site dry.  °· Do not apply powder or lotion to the site.  °· Do not sit in a bathtub, swimming pool, or whirlpool for 5 to 7 days.  °· No bending, squatting, or lifting anything over 10 pounds (4.5 kg) as directed by your caregiver.  °· Inspect the site at least twice daily.  °· Do not drive home if you are discharged the same day of the procedure. Have someone else drive you.  °· You may drive 24 hours after the procedure unless otherwise instructed by your caregiver.  °What to expect: °· Any bruising will usually fade within 1 to 2 weeks.  °· Blood that collects in the tissue (hematoma) may be painful to the touch. It should usually decrease in size and tenderness within 1 to 2 weeks.  °SEEK IMMEDIATE MEDICAL CARE IF: °· You have unusual pain at the site or down the  affected limb.  °· You have redness, warmth, swelling, or pain at the site.  °· You have drainage (other than a small amount of blood on the dressing).  °· You have chills.  °· You have a fever or persistent symptoms for more than 72 hours.  °· You have a fever and your symptoms suddenly get worse.  °· Your leg becomes pale, cool, tingly, or numb.  °· You have heavy bleeding from the site. Hold pressure on the site.  °Document Released: 09/18/2010 Document Revised: 08/05/2011 Document Reviewed: 09/18/2010 °ExitCare® Patient Information ©2012 [employer]. ° °

## 2014-06-28 NOTE — Discharge Summary (Signed)
Patient seen and examined and history reviewed. Agree with above findings and plan. See earlier rounding note.  Peter Jordan, MDFACC 06/28/2014 2:23 PM    

## 2014-06-28 NOTE — Progress Notes (Signed)
Patient Profile: 79 year old African-American male with past medical history of hypertension, peripheral vascular disease on DAPT, and history of end-stage renal disease on hemodialysis M,W,F tranferred from [place] [facility] 06/25/14 for urgent cardiac catheterization in the setting of NSTEMI.    Subjective: Currently chest pain free. No other symptoms.   Objective: Vital signs in last 24 hours: Temp:  [98.3 F (36.8 C)-99 F (37.2 C)] 98.4 F (36.9 C) (10/30 98110613) Pulse Rate:  [66-72] 70 (10/30 0613) Resp:  [15-18] 15 (10/29 1957) BP: (101-115)/(44-51) 115/51 mmHg (10/30 0613) SpO2:  [98 %-100 %] 98 % (10/30 91470613) Weight:  [220 lb 9.6 oz (100.064 kg)] 220 lb 9.6 oz (100.064 kg) (10/30 [id number]) Last BM Date: 06/26/14  Intake/Output from previous day: 10/29 0701 - 10/30 0700 In: 360 [P.O.:360] Out: -  Intake/Output this shift:    Medications Current Facility-Administered Medications  Medication Dose Route Frequency Provider Last Rate Last Dose  . 0.9 %  sodium chloride infusion  250 mL Intravenous PRN Corky CraftsJayadeep S Varanasi, MD      . 0.9 %  sodium chloride infusion  100 mL Intravenous PRN Sheffield SliderMartha B. Bergman, PA-C      . 0.9 %  sodium chloride infusion  100 mL Intravenous PRN Sheffield SliderMartha B. Bergman, PA-C      . acetaminophen (TYLENOL) tablet 650 mg  650 mg Oral Q4H PRN Corky CraftsJayadeep S Varanasi, MD      . albuterol (PROVENTIL) (2.5 MG/3ML) 0.083% nebulizer solution 2.5 mg  2.5 mg Inhalation Q6H PRN Corky CraftsJayadeep S Varanasi, MD      . amLODipine (NORVASC) tablet 2.5 mg  2.5 mg Oral Daily Corky CraftsJayadeep S Varanasi, MD   2.5 mg at 06/27/14 0947  . aspirin chewable tablet 81 mg  81 mg Oral Daily Corky CraftsJayadeep S Varanasi, MD   81 mg at 06/27/14 0947  . cinacalcet (SENSIPAR) tablet 60 mg  60 mg Oral Q breakfast Corky CraftsJayadeep S Varanasi, MD   60 mg at 06/26/14 1346  . clopidogrel (PLAVIX) tablet 75 mg  75 mg Oral Q breakfast Corky CraftsJayadeep S Varanasi, MD   75 mg at 06/27/14 0809  . darbepoetin (ARANESP) injection 60  mcg  60 mcg Intravenous Q Wed-HD Sheffield SliderMartha B. Bergman, PA-C   60 mcg at 06/26/14 [id number]  . doxercalciferol (HECTOROL) injection 1 mcg  1 mcg Intravenous Q M,W,F-HD Sheffield SliderMartha B. Bergman, PA-C   1 mcg at 06/26/14 0902  . feeding supplement (NEPRO CARB STEADY) liquid 237 mL  237 mL Oral BID BM Corky CraftsJayadeep S Varanasi, MD   237 mL at 06/27/14 1453  . feeding supplement (NEPRO CARB STEADY) liquid 237 mL  237 mL Oral PRN Sheffield SliderMartha B. Bergman, PA-C      . glimepiride (AMARYL) tablet 4 mg  4 mg Oral Q breakfast Corky CraftsJayadeep S Varanasi, MD   4 mg at 06/27/14 0809  . heparin injection 1,000 Units  1,000 Units Dialysis PRN Sheffield SliderMartha B. Bergman, PA-C      . heparin injection 2,000 Units  20 Units/kg Dialysis PRN Sheffield SliderMartha B. Bergman, PA-C      . insulin aspart (novoLOG) injection 0-15 Units  0-15 Units Subcutaneous TID WC Quintella Reichertraci R Turner, MD   3 Units at 06/27/14 1713  . insulin aspart (novoLOG) injection 0-5 Units  0-5 Units Subcutaneous QHS Quintella Reichertraci R Turner, MD   2 Units at 06/26/14 2117  . insulin glargine (LANTUS) injection 11 Units  11 Units Subcutaneous q morning - 10a Corky CraftsJayadeep S Varanasi, MD   11 Units at 06/27/14  [id number]  . lidocaine (PF) (XYLOCAINE) 1 % injection 5 mL  5 mL Intradermal PRN Sheffield SliderMartha B. Bergman, PA-C      . lidocaine-prilocaine (EMLA) cream 1 application  1 application Topical PRN Sheffield SliderMartha B. Bergman, PA-C      . losartan (COZAAR) tablet 25 mg  25 mg Oral Daily Corky CraftsJayadeep S Varanasi, MD   25 mg at 06/27/14 0947  . metoprolol (LOPRESSOR) tablet 50 mg  50 mg Oral BID Corky CraftsJayadeep S Varanasi, MD   50 mg at 06/27/14 2121  . multivitamin (RENA-VIT) tablet 1 tablet  1 tablet Oral Daily Corky CraftsJayadeep S Varanasi, MD   1 tablet at 06/26/14 1348  . nitroGLYCERIN (NITROSTAT) SL tablet 0.4 mg  0.4 mg Sublingual Q5 Min x 3 PRN Azalee CourseHao Meng, PA      . ondansetron (ZOFRAN) injection 4 mg  4 mg Intravenous Q6H PRN Corky CraftsJayadeep S Varanasi, MD      . oxyCODONE (Oxy IR/ROXICODONE) immediate release tablet 5 mg  5 mg Oral Q6H PRN Corky CraftsJayadeep S Varanasi, MD        . pentafluoroprop-tetrafluoroeth Peggye Pitt(GEBAUERS) aerosol 1 application  1 application Topical PRN Sheffield SliderMartha B. Bergman, PA-C      . pravastatin (PRAVACHOL) tablet 20 mg  20 mg Oral QPC supper Corky CraftsJayadeep S Varanasi, MD   20 mg at 06/27/14 1713  . sodium chloride 0.9 % injection 3 mL  3 mL Intravenous Q12H Corky CraftsJayadeep S Varanasi, MD   3 mL at 06/27/14 0952  . sodium chloride 0.9 % injection 3 mL  3 mL Intravenous PRN Corky CraftsJayadeep S Varanasi, MD        PE: General appearance: alert, cooperative and no distress Neck: no JVD Lungs: clear to auscultation bilaterally Heart: regular rate and rhythm and 2/6 SM Extremities: no LEE Pulses: 2+ and symmetric. Mild edema Skin: warm and dry Neurologic: Grossly normal  Lab Results:   Recent Labs  06/26/14 0330 06/28/14 0330  WBC 9.9 7.2  HGB 8.4* 7.3*  HCT 26.5* 23.8*  PLT 210 222   BMET  Recent Labs  06/26/14 0330 06/28/14 0320  NA 137 137  K 5.2 3.5*  CL 92* 94*  CO2 31 29  GLUCOSE 199* 77  BUN 35* 36*  CREATININE 5.45* 4.77*  CALCIUM 8.8 8.7   PT/INR No results found for this basename: LABPROT, INR,  in the last 72 hours Cholesterol  Recent Labs  06/26/14 0355  CHOL 91   Echo: Study Conclusions  - Left ventricle: The cavity size was normal. Wall thickness was normal. Systolic function was mildly reduced. The estimated ejection fraction was in the range of 45% to 50%. Inferior moderate-severe hypokinesis. Features are consistent with a pseudonormal left ventricular filling pattern, with concomitant abnormal relaxation and increased filling pressure (grade 2 diastolic dysfunction). - Ventricular septum: D-shaped interventricular septum suggests RV pressure/volume overload. - Aortic valve: Trileaflet; moderately calcified leaflets. Sclerosis without stenosis. - Mitral valve: Mildly calcified annulus. Mildly calcified leaflets . There was no significant regurgitation. - Left atrium: The atrium was moderately dilated. - Right  ventricle: The cavity size was moderately dilated. Systolic function was moderately reduced. - Right atrium: The atrium was mildly dilated. - Pulmonary arteries: No complete TR doppler jet so unable to estimate PA systolic pressure. - Systemic veins: IVC measured 2.0 cm with < 50% respirophasic variation, suggesting RA pressure 8 mmHg. - Pericardium, extracardiac: A trivial pericardial effusion was identified.  Impressions:  - Normal LV size with mildly decreased systolic function, EF 45-50%. Inferior hypokinesis. Moderate  diastolic dysfunction. There is prominent RV dysfunction: D-shaped septum with moderately dilated and hypokinetic RV, raising concern for RV infarction.   Assessment/Plan  Active Problems:   Acute MI, lateral wall, initial episode of care   ESRD (end stage renal disease)   NSTEMI (non-ST elevated myocardial infarction)  1. Lateral NSTEMI: cath 06/25/2014 80-90% mid LCx treated with DES, subtotally occluded prox RCA. EF 45-50% with inferior wall motion abnormality and RV dysfunction.  No recurrent angina.  Continue ASA, Plavix, BB, ARB. Would not add nitrates at this point with RV dysfunction. Favor treatment of RCA disease medically unless he has refractory angina.   2. ESRD: Nephrology following. HD M,W, F  3.  HTN: BP well controlled.   4. Anemia associated with chronic kidney disease: Hgb down to 7.3 today. Per nephrology, continue weekly ESA. I would recommend transfusion of one unit PRBC today with dialysis.   5. Disposition. Appreciate input from PT/OT/CSW. Plan to DC later today post HD to [facility] in [place] for ongoing therapy.   6. PAD on long term DAPT.  7. DM type 2. On amaryl. Can resume Lantus insulin that he was on PTA.  8. Hyperlipidemia on pravastatin.  Peter Swaz[place], MD 06/28/2014 7:46 AM

## 2014-06-28 NOTE — Progress Notes (Signed)
Inpatient Diabetes Program Recommendations  AACE/ADA: New Consensus Statement on Inpatient Glycemic Control (2013)  Target Ranges:  Prepandial:   less than 140 mg/dL      Peak postprandial:   less than 180 mg/dL (1-2 hours)      Critically ill patients:  140 - 180 mg/dL     Results for [name], [name] (MRN [id number]) as of 06/28/2014 08:23  Ref. Range 06/27/2014 07:41 06/27/2014 11:34 06/27/2014 16:26 06/27/2014 19:55  Glucose-Capillary Latest Range: 70-99 mg/dL 71 409135 (H) 811198 (H) 914193 (H)    Results for [name], [name] (MRN [id number]) as of 06/28/2014 08:23  Ref. Range 06/28/2014 08:08  Glucose-Capillary Latest Range: 70-99 mg/dL 46 (L)    Current DM Orders: Amaryl 4 mg daily            Lantus 11 units daily            Novolog Moderate SSI tid ac + HS   Hypoglycemic this AM (CBG 46 mg/dl).    MD- Please consider the following In-Hospital recommendations:  1. Decrease Levemir to 8 units daily in the AM 2. Decrease SSI to Sensitive scale tid ac + HS (currently ordered as Moderate scale)    Will follow Ambrose FinlandJeannine Johnston Shadaya Marschner RN, MSN, CDE Diabetes Coordinator Inpatient Diabetes Program Team Pager: [phone] (8a-10p)

## 2014-06-28 NOTE — Progress Notes (Signed)
CSW (Clinical Child psychotherapistocial Worker) prepared pt [facility] and provided to pt nurse. Pt is still at dialysis and unknown what time pt will return to unit. Pt nurse made aware of how to arrange transportation and asked to please place FL2 from chart in pt packet prior to pt dc. Pt nurse to also notify pt when transport is arranged and call facility with report. CSW relayed this to pt nurse as well as Press photographercharge nurse. CSW did call pt spouse and left voicemail to notify of dc.  Kennett Symes, LCSWA [phone]

## 2014-06-28 NOTE — Discharge Summary (Signed)
CARDIOLOGY DISCHARGE SUMMARY   Patient ID: [name] MRN: [id number] DOB/AGE: Jan 02, 1935 79 y.o.  Admit date: 06/25/2014 Discharge date: 06/28/2014  PCP: No primary provider on file. Primary Cardiologist: Dr. Abe People  Primary Discharge Diagnosis: Acute MI, lateral wall, initial episode of care - 2.75 x 16 Promus DES to the mid circumflex   Secondary Discharge Diagnosis:    ESRD (end stage renal disease)   NSTEMI (non-ST elevated myocardial infarction)   Acute on chronic anemia of chronic disease with blood loss secondary to procedures and blood draws.  Procedures: Left heart catheterization with selective coronary angiography, left ventriculogram. PCI left circumflex, 2-D echocardiogram  Hospital Course: [name] is a 79 y.o. male with past medical history of hypertension, questionable history of coronary artery disease, history of peripheral vascular disease on DAPT, and history of end-stage renal disease on hemodialysis Monday Wednesday and Friday. He went to [facility] for chest pain and had enzyme elevations, indicating a non-STEMI. He was tranferred from [place] to [facility] for urgent cardiac catheterization secondary to NSTEMI.  Cardiac catheterization results from below. He had Mild LAD disease, circumflex 90%->0% with  2.75 x 16 Promus DES, RCA subtotally occluded with left-right collaterals. An echocardiogram was performed to assess his EF. His EF is 45-50 percent with wall motion abnormalities. He tolerated the procedure well.  He was continued on his home medications but the losartan dose was decreased and he is tolerating the medication change well. He is to continue the decreased dose at discharge.  A nephrology consult was called and they managed his dialysis and his renal issues. He had acute on chronic anemia, and may require transfusion.  Physical therapy consult was called because of decreased exercise tolerance and decreased balance.  He was also felt to have decreased safety awareness and knowledge of precautions. He was seen by physical therapy and it was noted that he needs continued physical therapy services. He has generalized weakness as well as decreased strength and mobility. He had been at a skilled nursing facility prior to admission and will return there.  He was seen by cardiac rehabilitation. Although he does not ambulate at his facility, he was educated on MI restrictions and heart-healthy lifestyle modifications.  A 2-D echocardiogram was performed to further evaluate him. The results are below. He had inferior hypokinesis of the left ventricle as well as some RV dysfunction. This will be followed as an outpatient. Because of his RV dysfunction, we will not use nitrates for the subtotal RCA.  On 10/30 he was seen by Dr. [place] and all data were reviewed.  Optimal blood pressure control and volume control is recommended. He is to be on long-term dual antiplatelet therapy. Once he finishes dialysis, no further inpatient workup is indicated and he is considered stable for discharge, to follow up as an outpatient.  Labs:   Lab Results  Component Value Date   WBC 7.2 06/28/2014   HGB 7.3* 06/28/2014   HCT 23.8* 06/28/2014   MCV 93.7 06/28/2014   PLT 222 06/28/2014     Recent Labs Lab 06/28/14 0320  NA 137  K 3.5*  CL 94*  CO2 29  BUN 36*  CREATININE 4.77*  CALCIUM 8.7  GLUCOSE 77    Recent Labs  06/25/14 1457 06/25/14 1922 06/26/14 0054  TROPONINI <0.30 <0.30 <0.30   Lipid Panel     Component Value Date/Time   CHOL 91 06/26/2014 0355   TRIG 49 06/26/2014 0355  HDL 37* 06/26/2014 0355   CHOLHDL 2.5 06/26/2014 0355   VLDL 10 06/26/2014 0355   LDLCALC 44 06/26/2014 0355   Cardiac Cath: 06/25/2014 ANGIOGRAPHIC DATA: The left main coronary artery is widely patent. There is mild disease in the distal vessel.  The left anterior descending artery is a large vessel which wraps around the apex.  There is mild, disease in several portions of the mid vessel. There are several small diagonal vessels which are all widely patent. The LAD gives off collaterals which feed the distal RCA system.  The left circumflex artery is a large vessel. There is mild disease in the midportion of the vessel. The first obtuse marginal is small and patent. There is a large ramus vessel which is also widely patent. Just before the origin of the second obtuse marginal, there is an 80-90% stenosis. Disease extends into the second obtuse marginal. The remainder of the circumflex after the second marginal is medium sized..  The right coronary artery is subtotally occluded proximally. There is competitive flow in the mid to distal vessel. The PDA fills by brisk left to right collaterals.  LEFT VENTRICULOGRAM: Left ventricular angiogram was not done. LVEDP was 15 mmHg. IMPRESSIONS:  1. Widely patent left main coronary artery. 2. Mild disease in the left anterior descending artery and its branches. 3. Mild disease in the proximal to mid left circumflex artery. 80-90% lesion in the mid circumflex at the origin of the second obtuse marginal. This was successfully treated with a 2.75 x 16 Promus drug-eluting stent, postdilated to greater than 3 mm in diameter proximally. 4. Subtotally occluded proximal right coronary artery. There is both antegrade flow and retrograde flow from left to right collaterals 5. Left ventricular systolic function not assessed. LVEDP 15 mmHg. Ejection fraction to be assessed by echocardiogram. RECOMMENDATION: We'll continue the patient on aspirin and Plavix. He will need to stay on this for at least a year. He was already taking dual antiplatelet therapy due to peripheral vascular disease. Continue aggressive secondary prevention. Would plan for medical therapy of his RCA if he continues to have angina. If angina is refractory, could consider PCI.  EKG: 06/26/2014 Sinus rhythm, Q waves in V1 and V2,  minimal ST elevation in lead 1 and aVL Vent.  Rate 62 BPM PR interval 338 ms QRS duration 102 ms QT/QTc 456/462 ms P-R-T axes 97 -29 18  Echo: 06/27/2014 Conclusions - Left ventricle: The cavity size was normal. Wall thickness was normal. Systolic function was mildly reduced. The estimated ejection fraction was in the range of 45% to 50%. Inferior moderate-severe hypokinesis. Features are consistent with a pseudonormal left ventricular filling pattern, with concomitant abnormal relaxation and increased filling pressure (grade 2 diastolic dysfunction). - Ventricular septum: D-shaped interventricular septum suggests RV pressure/volume overload. - Aortic valve: Trileaflet; moderately calcified leaflets. Sclerosis without stenosis. - Mitral valve: Mildly calcified annulus. Mildly calcified leaflets . There was no significant regurgitation. - Left atrium: The atrium was moderately dilated. - Right ventricle: The cavity size was moderately dilated. Systolic function was moderately reduced. - Right atrium: The atrium was mildly dilated. - Pulmonary arteries: No complete TR doppler jet so unable to estimate PA systolic pressure. - Systemic veins: IVC measured 2.0 cm with < 50% respirophasic variation, suggesting RA pressure 8 mmHg. - Pericardium, extracardiac: A trivial pericardial effusion was identified.  Impressions: - Normal LV size with mildly decreased systolic function, EF 45-50%. Inferior hypokinesis. Moderate diastolic dysfunction. There is prominent RV dysfunction: D-shaped septum with moderately  dilated and hypokinetic RV, raising concern for RV infarction.   FOLLOW UP PLANS AND APPOINTMENTS Allergies  Allergen Reactions  . Contrast Media [Iodinated Diagnostic Agents] Hives  . Fish Allergy Hives    Shell fish     Medication List    STOP taking these medications       amoxicillin-clavulanate 875-125 MG per tablet  Commonly known as:  AUGMENTIN      TAKE  these medications       acetaminophen 650 MG CR tablet  Commonly known as:  TYLENOL  Take 650 mg by mouth every 4 (four) hours as needed for pain or fever.     albuterol 108 (90 BASE) MCG/ACT inhaler  Commonly known as:  PROVENTIL HFA;VENTOLIN HFA  Inhale 2 puffs into the lungs every 6 (six) hours as needed for wheezing or shortness of breath.     amLODipine 2.5 MG tablet  Commonly known as:  NORVASC  Take 2.5 mg by mouth daily.     aspirin EC 81 MG tablet  Take 81 mg by mouth daily.     b complex-vitamin c-folic acid 0.8 MG Tabs tablet  Take 1 tablet by mouth at bedtime.     cinacalcet 60 MG tablet  Commonly known as:  SENSIPAR  Take 60 mg by mouth daily.     clopidogrel 75 MG tablet  Commonly known as:  PLAVIX  Take 75 mg by mouth daily.     feeding supplement (PRO-STAT SUGAR FREE 64) Liqd  Take 30 mLs by mouth daily.     ferrous sulfate 325 (65 FE) MG tablet  Take 325 mg by mouth 2 (two) times daily with a meal.     glimepiride 4 MG tablet  Commonly known as:  AMARYL  Take 4 mg by mouth daily with breakfast.     insulin glargine 100 UNIT/ML injection  Commonly known as:  LANTUS  Inject 11 Units into the skin every morning.     losartan 25 MG tablet  Commonly known as:  COZAAR  Take 1 tablet (25 mg total) by mouth daily.     metoprolol 50 MG tablet  Commonly known as:  LOPRESSOR  Take 50 mg by mouth 2 (two) times daily.     NEPRO Liqd  Take 8 oz by mouth 2 (two) times daily.     nitroGLYCERIN 0.6 MG SL tablet  Commonly known as:  NITROSTAT  Place 0.6 mg under the tongue every 5 (five) minutes as needed for chest pain.     oxyCODONE 5 MG immediate release tablet  Commonly known as:  Oxy IR/ROXICODONE  Take 5 mg by mouth every 6 (six) hours as needed (pain).     pravastatin 20 MG tablet  Commonly known as:  PRAVACHOL  Take 20 mg by mouth at bedtime.        Discharge Instructions   Diet - low sodium heart healthy    Complete by:  As directed       Increase activity slowly    Complete by:  As directed           Follow-up Information   Follow up with Corky Crafts., MD. (The office will call.)    Specialty:  Interventional Cardiology   Contact information:   [address] [place] [address] [phone]       BRING ALL MEDICATIONS WITH YOU TO FOLLOW UP APPOINTMENTS  Time spent with patient to include physician time: 36 min Signed: Theodore Demark,  PA-C 06/28/2014, 1:56 PM Co-Sign MD

## 2014-06-29 LAB — TYPE AND SCREEN
ABO/RH(D): O POS
ANTIBODY SCREEN: NEGATIVE
UNIT DIVISION: 0

## 2014-08-08 ENCOUNTER — Encounter (HOSPITAL_COMMUNITY): Payer: Self-pay | Admitting: Interventional Cardiology

## 2014-08-22 ENCOUNTER — Encounter: Payer: Medicare Other | Admitting: Physician Assistant

## 2014-09-03 ENCOUNTER — Encounter (HOSPITAL_COMMUNITY): Payer: Self-pay | Admitting: *Deleted

## 2014-12-05 ENCOUNTER — Ambulatory Visit
Admit: 2014-12-05 | Disposition: A | Discharge status: Home / Self Care | Payer: Self-pay | Attending: Vascular Surgery | Admitting: Vascular Surgery

## 2014-12-17 NOTE — Op Note (Signed)
PATIENT NAME:  [name], [name] MR#:  [id number] DATE OF BIRTH:  10-15-1934  DATE OF PROCEDURE:  03/28/2012  PREOPERATIVE DIAGNOSES:  1. Atherosclerotic occlusive disease, left upper extremity with rest pain.  2. 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  3. End-stage renal disease requiring hemodialysis.  4. Complication AV dialysis device with low flow and inefficient dialysis.   POSTOPERATIVE DIAGNOSES:  1. Atherosclerotic occlusive disease, left upper extremity with rest pain.  2. 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  3. End-stage renal disease requiring hemodialysis.  4. Complication AV dialysis device with low flow and inefficient dialysis.   PROCEDURES PERFORMED:  1. Arch aortogram.  2. Left upper extremity distal runoff, third order catheter placement.  3. Percutaneous transluminal angioplasty to 5 mm left ulnar artery.   SURGEON: [name] DillsGregory G. Schnier, MD  SEDATION: Versed 2 mg plus fentanyl 50 mcg administered IV. Continuous ECG, pulse oximetry and cardiopulmonary monitoring is performed throughout the entire procedure by the interventional radiology nurse. Total sedation time was 45 minutes.   ACCESS: 6 French sheath, right common femoral artery.   FLUORO TIME: 6.1 minutes.   CONTRAST USED: Isovue 58 mL.   INDICATIONS: Mr. [name] is a 80 year old gentleman who has end-stage renal disease on hemodialysis and currently maintained using a left arm brachial axillary dialysis graft. Recently they have been experiencing very slow flows and difficulty with his dialysis. He has also been experiencing increasing arm pain. Physical examination as well as noninvasive studies demonstrated a high-grade stricture within the ulnar artery just proximal to the location of the arterial anastomosis of his AV graft. This is a long tapered segment measuring approximately 6 cm. Risks and benefits for angiography and  possible intervention were reviewed. All questions answered. Patient agrees to proceed.   DESCRIPTION OF PROCEDURE: Patient is taken to special procedures, placed in supine position. After adequate sedation is achieved, both groins are prepped and draped in sterile fashion. Ultrasound is placed in a sterile sleeve. Ultrasound is utilized secondary to lack of appropriate landmarks and to avoid vascular injury. Under direct ultrasound visualization, common femoral artery is identified. It is echolucent and pulsatile indicating patency. Image is recorded. Under real-time visualization micropuncture needle is used to access the anterior wall. Microwire followed by micro sheath is then inserted. J-wire followed by 5 French sheath and 5 French pigtail catheter are then advanced. Pigtail catheter is positioned in the ascending aorta and an LAO projection of the aortic arch is obtained with a bolus injection of contrast.   Stiff angled Glidewire and H1 catheter are then exchanged via the pigtail catheter and the left subclavian is engaged. Catheter is advanced approximately 4 to 5 cm and hand injection of contrast is used to demonstrate the proximal one-third of the subclavian artery. Wire is then negotiated more distally toward the axillary and again distal runoff is obtained. Catheter is then negotiated down to the bifurcation of the radial and the ulnar arteries. This is a very high bifurcation occurring at the mid humeral level and the AV graft is anastomosed to the ulnar artery. Radial and interosseous come off and are widely patent.   High-grade stricture over approximately 4 to 6 cm as suggested by the ultrasound is verified, greater than 80% stenosis is noted. It is actually quite miraculous that the AV graft is not thrombosed.   4000 units of heparin is given. Stiff angled Glidewire is then reintroduced and negotiated  down into the ulnar artery passed the lesion. 5 French sheath and H1 catheter are removed  and a 90 cm shuttle sheath is advanced over the wire into the distal brachial artery just above the bifurcation. A 4 x 4 balloon is then advanced over the Glidewire across the arterial anastomosis of the AV graft and balloon angioplasty is then performed. Glidewire is exchanged for 300 versa core wire prior to initiating intervention. Balloon is then repositioned to cover the lesion in its entirety and back it up by approximately 4 cm and subsequently re-intervention is performed, again inflation to 10 to 12 atmospheres for one minute. The area is significantly undersized and therefore a 5 x 4 balloon is advanced over the wire through the lesion and again two serial angioplasties are performed each to 10 atmospheres for one minute. Follow-up angiography demonstrates an excellent result with less than 5% residual stenosis and rapid flow of contrast into the graft now. Bifurcation of the radial and ulnar arteries is preserved.   Sheath is pulled down into the external iliac on the right. Oblique view of the right groin is obtained. There is a high-grade, greater than 50%, stenosis in the mid common femoral and therefore an ACT is performed. ACT is 223. Short 6 French sheath is inserted and the patient will have this sheath discontinued in 1.5 hours after the heparin has worn off appropriately. He is not a candidate for closure device.   INTERPRETATION: The arch demonstrates all great vessels are patent at their origins. There is type I arch. The left subclavian is widely patent throughout its course as is the left axillary and the brachial artery. There is a high bifurcation of the ulnar and radial arteries at the mid humeral level. Radius appears widely patent and the interosseous branches from it. The ulnar demonstrates high-grade stenosis for approximately 4 to 5 cm prior to the anastomosis and the brachial axillary graft emanates from the ulnar artery. It is actually an ulnar axillary graft. Venous return  appears widely patent.   Following angioplasty, first at 4 and then 5 mm there is complete resolution of the stenosis and huge improvement in flow through the ulnar axillary graft.   SUMMARY: Successful revascularization of the ulnar artery improving flow to the AV graft.    ____________________________ [name] Dills, MD ggs:cms D: 03/28/2012 16:41:57 ET T: 03/28/2012 17:08:47 ET JOB#: [id number]  cc: [name] Dills, MD, <Dictator> Munsoor Lizabeth Leyden, MD Neomia Dear. Harrington Challenger, MD [name] Dills MD ELECTRONICALLY SIGNED 04/09/2012 13:20

## 2014-12-20 NOTE — Op Note (Signed)
PATIENT NAME:  [name], [name] DATE OF BIRTH:  September 12, 1934  DATE OF PROCEDURE:  07/24/2013  PREOPERATIVE DIAGNOSES:  1. Complication of arteriovenous dialysis device.  2. End-stage renal disease, requiring hemodialysis.  3. Superior vena cava syndrome.  4. Peripheral arterial disease, bilateral upper extremities.   POSTOPERATIVE  DIAGNOSES:  1. Complication of arteriovenous dialysis device.  2. End-stage renal disease, requiring hemodialysis.  3. Superior vena cava syndrome.  4. Peripheral arterial disease, bilateral upper extremities.   PROCEDURES PERFORMED:  1. Contrast injection of left arm brachial axillary dialysis graft.  2. Central venogram.  3. Percutaneous transluminal angioplasty of the axillary and subclavian vein.  4. Percutaneous transluminal angioplasty of the venous portion of the arteriovenous graft.  5. Percutaneous transluminal angioplasty of the radial artery using a 5 mm Lutonix balloon.   PROCEDURE PERFORMED BY: Renford DillsGregory G. Zannah Melucci, MD  SEDATION: Versed 4 mg plus fentanyl 150 mcg administered IV. Continuous ECG, pulse oximetry and cardiopulmonary monitoring is performed throughout the entire procedure by the interventional radiology nurse. Total sedation time was 1 hour 20 minutes.   ACCESS:  1. A 6 French sheath, antegrade direction, left arm brachial axillary dialysis graft.  2. A 6 French sheath, retrograde direction, left arm brachial axillary dialysis graft.   CONTRAST USED: Isovue 50 mL.   FLUOROSCOPY TIME: 4.1 minutes.   INDICATIONS: [name] is a 80 year old gentleman maintained with a left arm brachial axillary dialysis graft. Recently, they have had increasing difficulties with dialysis, and his volume flow rates have dropped dramatically. They are now below 500, which is a prethrombotic level. He has therefore been referred back for evaluation and treatment of his AV graft. The risks and benefits were reviewed. All questions  answered. The patient agrees to proceed.   DESCRIPTION OF PROCEDURE: The patient is taken to special procedures and placed in the supine position. After adequate sedation is achieved, he is positioned supine with his left arm extended palm upward. The left arm is prepped and draped in a sterile fashion. Appropriate timeout is called.   Lidocaine 1% is infiltrated in the soft tissues overlying the palpable graft near the arterial anastomosis, and in an antegrade direction, a micropuncture needle is inserted. A microwire followed by a micro sheath, J-wire followed by a 6 French sheath. Hand injection of contrast is then used to demonstrate the venous portions of the graft as well as the central veins. Narrowing is noted in the subclavian and axillary veins as well as within the previously stented segment of the AV graft itself. Heparin 4000 units is given, and a Magic Torque wire is advanced through the lesions. An 8 x 4 [place] balloon is then used to angioplasty first the subclavian axillary lesion. Inflation is for 2 minutes to 18 atmospheres. Followup angiography demonstrates resolution of this lesion. The balloon is repositioned into the graft itself, where angioplasty is performed. Serial angioplasty is performed across the portion that is utilized for the venous access.    These venous inflations are to 12 to 14 atmospheres for 1 to 2 minutes.   With the balloon inflated, reflux of contrast is then utilized to demonstrate the arterial portion and arterial anastomosis. There appear to be 3 distinct lesions within the radial artery. This is actually a patient who has a high radial-ulnar bifurcation in the upper arm. These are clearly also flow-limiting, and therefore a second sheath is started in a retrograde direction more proximally on the arm, using a micropuncture 6 [place]  sheath and a wire, Kumpe catheter is negotiated through the radial artery. A 5 x 10 Lutonix balloon is then inflated along the  radial artery extending into the arterial anastomosis. Inflation is for 3 minutes to 10 atmospheres. Followup angiography through a KMP catheter more proximal demonstrates widely patent artery. The entire system is then imaged 1 more time via injection through the Kumpe catheter, and the sheaths are subsequently pulled after pursestring sutures are placed. There are no immediate complications.   INTERPRETATION: Initial views of the arteriovenous graft demonstrate the multiple previously placed stents. Throughout the venous cannulation zone, there are high-grade strictures. Within the proximal axillary/distal subclavian, there is a narrowing. Central veins are otherwise patent. Reflux demonstrates the arterial portion of the graft itself is patent, but at the anastomosis and extending into the radial artery, there are several areas that are greater than 70%. The more proximal axillary artery is widely patent. Following angioplasty, using an 8 mm balloon, the venous lesions are treated, both central and within the graft. Following angioplasty with a 5 mm Lutonix balloon, the radial artery now provides widely patent, but normalized inflow.   Successful salvage of left arm brachial axillary dialysis graft as described above.   ____________________________ Renford Dills, MD ggs:lb D: 07/25/2013 10:15:26 ET T: 07/25/2013 10:48:11 ET JOB#: [id number]  cc: Renford Dills, MD, <Dictator> Renford Dills MD ELECTRONICALLY SIGNED 08/06/2013 17:18

## 2014-12-21 NOTE — Discharge Summary (Signed)
PATIENT NAME:  [name], [name] MR#:  [id number] DATE OF BIRTH:  12/15/1934  DATE OF ADMISSION:  09/03/2013 DATE OF DISCHARGE:  09/13/2013  ADMITTING PHYSICIAN:  Dr. Imogene Burn.  DISCHARGING PHYSICIAN:  Enid Baas, MD  PRIMARY CARE PHYSICIAN:  Dr. Harrington Challenger.  CONSULTATIONS IN THE HOSPITAL: 1.  Neurology consultation by Dr. Delos Haring.  2.  Nephrology consultation by Dr. Mady Haagensen.  3.  Consultation by Dr. Trixie Deis.  4.  Podiatric consultation by Dr. Gwyneth Revels.  5.  ID consultation by Dr. Clydie Braun.  6.  Surgical consultation by Dr. Excell Seltzer for diverticulitis.   DISCHARGE DIAGNOSES:  1.  Toxic metabolic encephalopathy.  2.  Staph capitis bacteremia.  3.  Diverticulitis.  4.  Dementia.  5.  Diabetes mellitus.  6.  Hypertension.  7.  End-stage renal disease on Monday, Wednesday, Friday hemodialysis.  8.  Diabetic neuropathy. 9.  Hyperlipidemia.   DISCHARGING MEDICATIONS:  1.  Aspirin 81 mg p.o. daily.  2.  Sublingual nitroglycerin 0.4 mg as needed for chest pain.  3.  Metoprolol 25 mg p.o. daily.  4.  Pravachol 20 mg p.o. at bedtime.  5.  Sevelamer 800 mg 3 capsules 3 times a day with meals.  6.  Losartan 50 mg p.o. daily.  7.  Renal capsule with vitamins 1 tablet p.o. daily.  8.  Albuterol inhaler 2 puffs 4 times a day as needed for shortness of breath.  9.  Levemir 25 units subcutaneous at bedtime.  10.  Vancomycin 1 gram IV with dialysis on Monday, Wednesday and Friday until 09/20/2013.  11.  Polyethylene glycol 17 grams daily p.r.n. for constipation.   DISCHARGE HOME OXYGEN:  None.   DISCHARGE DIET:  Low-sodium diet and pureed food with nectar-thick liquids. Aspiration precautions and medicine crushed as able to and moisten the foods.   DISCHARGE ACTIVITY:  As tolerated.    FOLLOWUP INSTRUCTIONS:  1.  PCP followup in 1 week.  2.  Neurology followup in 2 weeks.  3.  Physical therapy.  LABS AND IMAGING STUDIES PRIOR TO DISCHARGE:  WBC 9.1,  hemoglobin 9.7, hematocrit 29.9, platelet count 252.   Sodium 137, potassium 4.0, chloride 98, bicarb 32, BUN 44, creatinine 5.76, glucose 313 and calcium of 10.2.   MRI of the brain without contrast showing no evidence of acute intracranial abnormality or mass, moderate cerebral atrophy and bilateral mass or effusions noted.  Left arm dialysis graft ultrasound showing no evidence of any perigraft abscess. 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   Blood cultures from 0108//2015 are negative. Blood cultures on admission on 09/04/2013 growing staph capitis, which is sensitive to oxacillin and gentamicin at this time.   BRIEF HOSPITAL COURSE:  For full details, please look at the interim discharge summary dictated by Dr. Hilda Lias on 09/11/2013 and also look at the history and physical dictated by Dr. Imogene Burn on 09/03/2013. In brief, Mr. [name] is a 80 year old African American male with past medical history significant for hypertension, diabetes, end-stage renal disease on hemodialysis, who presents to the hospital for a 2-week history of change in mental status and lower extremity weakness.  1.  Altered mental status. Likely toxic metabolic encephalopathy. The patient was febrile when he presented. The source of fever was unknown though blood cultures were growing staph capitis, which could be contaminant, but because he is a dialysis patient with graft and the risk of infection is high and with his  fevers, Infectious Disease had decided to treat him with vancomycin. The patient has been getting vancomycin with dialysis and he will need a total 2-week course of vancomycin and he will finish off on 09/20/2013. His mental status is improved to the point that he is awake, alert and oriented to self at this time. He does recognize family and maintains conversation at times and still gets confused. MRI of the brain did not show any acute  findings but does have underlying dementia and Neurology recommended that he be followed up at the rehab as no acute improvement in mental status can be expected at this time.  2.  Acute diverticulitis based on imaging studies. He was started on Invanz in the hospital. Gastroenterology and Surgery consults were obtained and he has not had any further fevers at this time. He has finished a 7-day course and his antibiotics are being stopped at the time of discharge.  3.  Bilateral lower extremity weakness. The patient does have some chronic edema and skin changes. According to wife, he was walking with a cane and walker at baseline. His weakness has been progressive and worsening over the last 2 weeks. His strength is 1/5 in both lower extremities at this time. He has an MRI of the lumbar spine pending prior to discharge and will follow up with Neurology as an outpatient and will need intense physical therapy.  4.  Insulin-dependent diabetes mellitus. The patient was initially n.p.o., and then after Speech cleared him with the improvement in his mental status, he is placed on a dysphagia I diet with nectar-thick liquids and his sugars are being elevated, so his Levemir dose has been increased appropriately and adjusted to cover for the elevated sugars. 5.  Hypertension. He is on oral metoprolol and losartan at this time.  6.  Diabetic neuropathy. His Neurontin is being held because of his mental status.  7.  End-stage renal disease on Monday, Wednesday, Friday hemodialysis. He has been getting dialyzed per schedule while in the hospital.  8.  His course has been otherwise uneventful in the hospital.   DISCHARGE CONDITION:  Stable.   DISCHARGE DISPOSITION:  [facility].  TIME SPENT ON DISCHARGE:  45 minutes.  ____________________________ Enid Baasadhika Honora Searson, MD rk:jm D: 09/13/2013 14:03:21 ET T: 09/13/2013 15:39:01 ET JOB#: [id number]  cc: Enid Baasadhika Marchele Decock, MD,  <Dictator> Neomia Dearavid N. Harrington Challengerhies, MD Enid BaasADHIKA Gaylen Venning MD ELECTRONICALLY SIGNED 09/27/2013 13:31

## 2014-12-21 NOTE — Consult Note (Signed)
PATIENT NAME:  [name], [name] MR#:  [id number] DATE OF BIRTH:  05-18-1935  DATE OF ADMISSION:  09/03/2013 DATE OF CONSULTATION:  09/06/2013  REFERRING PHYSICIAN:  [facility].  CONSULTING PHYSICIAN:  Carmie End, MD  PRIMARY CARE PHYSICIAN:   Dr. Mickey Farber.   CHIEF COMPLAINT: Weakness, altered mental status.   BRIEF HISTORY: Mr. Lottman is a 80 year old gentleman admitted to the hospital on 09/03/2013, with weakness, altered mental status. The patient has multiple medical problems including end-stage renal disease requiring 3 times a week dialysis, hypertension, diabetes, coronary artery disease, previous myocardial infarction, colon cancer, lung cancer, hyperlipidemia, lower extremity vascular occlusive disease, status post percutaneous intervention. The patient is confused and minimally responsive at the time of my evaluation. The history is all obtained from the chart and the patient's family, who are present for the interview. In speaking with his wife, she noted that on January 4th, the day prior to admission, he was weak, complaining of inability to stand or get out of bed on his own. He presented to the Emergency Room for further evaluation. ED workup results are not present at the present time, but he was not found to have any significant abnormalities. He did not have any evidence of coronary disease at the time. Abdominal CT scan did not reveal any significant abdominal process, other than diverticulosis and some constipation. The patient went back home that evening. The patient was helped into bed by his wife and son, and the following morning the patient attempted to get out of bed and fell on the floor. They could not get the patient aroused enough to bring to dialysis, so he was transported back to the Emergency Room by ambulance and was readmitted to the hospital with weakness and altered mental status.   The family relates that he has had multiple episodes of previous mental status  issues. The wife says he has a problem essentially once a year. He is hospitalized, spends several weeks in rehab, the symptoms resolve, and etiology has never been clear. He has been seen by neurology during this hospitalization. Today, he is felt to have multifactorial metabolic encephalopathy.   Wife relates the patient has complained of abdominal pain for several months. He has had previous evaluations at [facility]. He had a colonoscopy performed in August by Dr. Marva Panda, which demonstrated marked diverticulosis but no evidence of any diverticulitis. The patient has complained of left lower quadrant  pain and diarrhea for some time. His wife relates that he has intermittent low-grade fever and nausea on occasion. He complains of postprandial fullness and abdominal discomfort. The symptoms have been progressively worsening over the last several months. He was seen in the spring by Dr. Renda Rolls for rectal bleeding, which is felt to be based on hemorrhoidal disease, and he underwent hemorrhoid banding procedure in the office. His symptoms resolved for several weeks, but recurred and have been persistent.   He has a history of colon cancer; many years ago undergoing a colon resection at the [place] [facility]. He has also had previous lung cancer surgery performed at the [place] [facility]. He is regularly followed by Dr. Hal Morales, and is dialyzed in [place].  He does have a history of tobacco use in the past but not recently. He does have a drinking history, but again, not recently.  The patient's family suggests that he has never had a problem with alcohol abuse.   Review of systems  is not possible in the current situation.   His medication are well outlined in his medical admission notes.   PHYSICAL EXAMINATION: GENERAL: The patient partially obtunded with little ability to cooperate with the examination. He is breathing spontaneously. He  does not appear to be in any distress, but is essentially noncommunicative.  VITAL SIGNS:  His temperature is 100.6. Heart rate is 98 and regular. Blood pressure is 120/84. His oxygen saturation is 100% on 2 liters.  HEENT: Reveals no scleral icterus. No pupillary abnormalities.  NECK: Supple, nontender. No adenopathy  CHEST: Clear with no adventitious sounds, although he has very distant breath sounds, and he has reduced pulmonary excursion.  CARDIAC: Reveals a rapid rate with no murmurs or gallops noted to my ear. He seems to be in normal sinus rhythm, but it is fairly difficult to be certain in this situation.  ABDOMEN: Mildly distended and soft. He has lower abdominal scar from previous surgery. He does not have any masses noted. He does have some reaction to palpation in the left lower quadrant with some grimacing. Otherwise, he has no guarding or rebound or hernias noted.  CT scan did demonstrate a left inguinal hernia, which appears to be fat filled, but I cannot appreciate it on current clinical examination.  EXTREMITIES: His lower extremities reveal some mild edema and a little bit of wasting, and I am not able to test his strength or range of motion because of his altered mental status.  PSYCHIATRIC: Reveals marked abnormalities with orientation and mental status.   The patient underwent a repeat CT scan of the abdomen because of his abdominal pain on the 6th. He was noted to have essentially a similar picture to the 4th with an increase in his constipation and some stranding in the left lower quadrant consistent with, but not diagnostic of, diverticulitis. There was no perforation. No free air and no free fluid. No abscess. The site of the previous anastomosis appears to be in the distal sigmoid colon.  We do not have those operative records. The surgical service was consulted at the present time to discuss possible surgical intervention for diverticulitis with failure of the current medical  therapy.   The patient's laboratory values demonstrate slight elevation in his white blood cell count of 10,000 on admission to 19.7 on the 7th to 19.7 on the 8th. His hemoglobin is 9.8 following his dialysis. His troponins are slightly elevated at 0.08. Liver function studies are not significantly elevated. His ammonia level yesterday was greater than 50. He does have significant electrolyte abnormalities consistent with his known renal insufficiency.  Cardiac echo was performed yesterday, which demonstrated an ejection fraction of greater than 55%   I have independently reviewed his CT scans. He does not have evidence of significant diverticulitis. His family history reporting up to 6 months of abdominal pain makes it difficult to attribute all of these symptoms to diverticulitis. Certainly, he would be a very poor surgical candidate at the present time with his altered mental status, weakness and multiple medical problems. I would aggressively continue his antibiotic therapy rather than consider surgical intervention currently. If he were to consider surgery, he would likely require vent support and a colostomy. The patient's family is not particularly interested in a surgical approach. The [facility] made a comment [facility] be [facility]. At the present time, I do not see any indication for urgent surgical repair. We will continue to follow him while he  is hospitalized.        ____________________________ Quentin Ore III, MD rle:dmm D: 09/06/2013 20:27:12 ET T: 09/06/2013 20:57:16 ET JOB#: [id number]  cc: Carmie End, MD, <Dictator> Dow Adolph, MD Neomia Dear. Harrington Challenger, MD Quentin Ore MD ELECTRONICALLY SIGNED 09/06/2013 23:24

## 2014-12-21 NOTE — Consult Note (Signed)
GI follow up.  have no additional recs in terms of the diverticulitis at this time. Agree with completing a 10 to 14 day course of Abx. on his poor overall health status, would not plan on any futher colonoscopies.  will sign off.  Please call with any questions for concerns.    Electronic Signatures: Dow Adolphein, Matthew (MD)  (Signed on 14-Jan-15 15:20)  Authored  Last Updated: 14-Jan-15 15:20 by Dow Adolphein, Matthew (MD)

## 2014-12-21 NOTE — Consult Note (Signed)
PATIENT NAME:  [name] PoundsBURTON, [name] DATE OF BIRTH:  11-01-1934  DATE OF CONSULTATION:  09/06/2013  REFERRING PHYSICIAN:   CONSULTING PHYSICIAN:  Euna Armon A. Ether GriffinsFowler, DPM  REASON FOR CONSULTATION: Right foot wound.   HISTORY OF PRESENT ILLNESS: This is a 80 year old gentleman, who is known to me in the outpatient clinic, who was recently admitted with lower extremity weakness, difficulty walking and has become encephalopathic at this point. He is not able to provide any review of systems. Based on in-hospital notes, I see where he has had recent blood cultures that were positive for gram-positive cocci in clusters. I have seen him in the outpatient clinic approximately a week and a half ago. He had a noted gangrenous right fourth toe. I recommended he follow up with vascular surgery at the time and follow up with me after vascular surgery had seen him for amputation. There was concern that the site of infection could be coming from his right foot.   PAST MEDICAL HISTORY:  1.  End-stage renal disease, on dialysis. 2.  Coronary artery disease. 3.  Hypertension. 4.  Diabetes. 5.  History of lung cancer. 6.  History of colon cancer.  7.  Peripheral vascular disease.  8.  Sleep apnea.  9.  Hyperlipidemia.   HOME MEDICATIONS: Sevelamer, renal caps, vitamin B complex, pravastatin, Zofran, nitroglycerin, MiraLax, Lopressor, losartan, Lantus, Humalog sliding scale, gabapentin, Colace,  aspirin, albuterol and Percocet.   ALLERGIES: IVP DYE AND SHELLFISH.   SOCIAL HISTORY: Lives at home with his wife.  REVIEW OF SYSTEMS: The patient is not able to provide at this time.   PHYSICAL EXAMINATION:  GENERAL: He is not alert or oriented.  VASCULAR: He has nonpalpable pulses to the lower extremity.  NEUROLOGIC: In the outpatient clinic he has not been completely neuropathic, though unable to elicit any issues today.  DERMATOLOGIC: He has a dry gangrenous right fourth toe, no purulent drainage.  No signs of any infection at this point. He has an area of gangrene on his fifth toe as well. This is all contained distal to the MTPJ on both of these. There is no lymphangitic streaking. No signs of any obvious deep space infection at all.  MUSCULOSKELETAL: He has diffuse edema to the lower extremities.   ASSESSMENT: Right fourth and fifth toe gangrene.   PLAN: I do not think this is the site of his infection. Everything looks to be dry. It certainly is not draining. There is no foul odor at this point. We will continue to monitor this until the patient is more stable and can be seen by vascular surgery for revascularization and then amputation as needed. Continue to keep the area nice and dry. No further treatment needed at this point. Please reconsult me if needed in the future.  ____________________________ Argentina DonovanJustin A. Ether GriffinsFowler, DPM jaf:aw D: 09/06/2013 13:22:36 ET T: 09/06/2013 13:35:30 ET JOB#: [id number]  cc: Jill AlexandersJustin A. Ether GriffinsFowler, DPM, <Dictator> Brenn Gatton DPM ELECTRONICALLY SIGNED 09/20/2013 10:14

## 2014-12-21 NOTE — Op Note (Signed)
PATIENT NAME:  [name] MR#:  [id number] DATE OF BIRTH:  1935-07-13  DATE OF PROCEDURE:  10/04/2013  PREOPERATIVE DIAGNOSES:  1.  Peripheral arterial disease with ulceration and gangrene, right lower extremity.  2.  End-stage renal disease.  3.  Fever.  4.  Anemia.  5.  Hypertension.  6.  Hyperlipidemia.   POSTOPERATIVE DIAGNOSES:  1.  Peripheral arterial disease with ulceration and gangrene, right lower extremity.  2.  End-stage renal disease.  3.  Fever.  4.  Anemia.  5.  Hypertension.  6.  Hyperlipidemia.   PROCEDURES: 1.  Ultrasound guidance for vascular access to, left femoral artery.  2.  Catheter placement into right popliteal artery from left femoral approach.  3.  Aortogram and selective right lower extremity angiogram.  4.  Percutaneous transluminal angioplasty of the left common iliac artery with a 6-mm diameter angioplasty balloon.  5.  Percutaneous transluminal angioplasty of right popliteal artery with 5-mm diameter conventional and drug-eluting angioplasty balloon.  6.  Percutaneous transluminal angioplasty of mid superficial femoral artery with 5-mm diameter angioplasty balloon.  7.  Percutaneous transluminal angioplasty of proximal superficial femoral artery and common femoral artery with 5-mm diameter conventional and a 6-mm diameter drug-eluting angioplasty balloon.  8.   StarClose closure device, left femoral artery.   SURGEON:  Annice NeedyJason S. Dew, M.D.   ANESTHESIA:  Local with moderate conscious sedation.   ESTIMATED BLOOD LOSS:  25 mL.   INDICATION FOR PROCEDURE:  This is a [age] year old African American male with multiple medical comorbidities. He has a gangrenous right foot. He is brought in for an angiogram for revascularization. He also has a low-grade fever and anemia today. The risks and benefits of the procedure were discussed. Informed consent was obtained.   DESCRIPTION OF PROCEDURE:  The patient is brought to the vascular suite, and after an  adequate level of intravenous sedation attained, his groins were sterilely prepped and draped, and a sterile surgical field was created. The left femoral head was localized with fluoroscopy. Ultrasound and fluoroscopic guidance and access were necessary due to the dense calcific disease in the left common femoral artery. Access was achieved with a micropuncture needle, and a micropuncture wire and sheath were placed and upsized to a 5-French sheath. A pigtail catheter was placed into the aorta at the L1-L2 level and then pulled down to the aortic bifurcation. Minimal renal artery flow was seen and the aorta and iliacs were highly calcific. The left common iliac artery had about a 75% to 80% stenosis that would limit our abilities to treat the right leg, I replaced the wire and treated this lesion with a 6-mm diameter angioplasty balloon with a good angiographic completion result and less than 30% residual stenosis. I then crossed the aortic bifurcation with a rim catheter and advanced to the right femoral head. A selective right lower extremity angiogram was then performed. This showed moderate to high-grade narrowing in the 70% to 75% range in the distal common femoral artery and proximal superficial femoral artery over several centimeters. The SFA was then patent for about 10 cm span. At the top of the previously placed stent, there is a short segment occlusion. We then reconstituted the distal superficial femoral artery and above-knee popliteal artery and in the above-knee popliteal artery, there was another stenosis in the 75% to 80% range. He then had a patent below-the-knee popliteal artery and 2-vessel runoff distally. He had these multiple separate and distinct lesions in the SFA and popliteal vessels  and I elected to treat them. He was given 3000 units of intravenous heparin for systemic anticoagulation and a 6-French Ansell sheath was placed over a [employer] wire. I was able to cross all these  lesions with minimal difficulty and confirm intraluminal flow in the below-knee popliteal artery. I then replaced the Advantage wire. A 5-mm diameter angioplasty balloon was inflated from the knee up into the common femoral artery. Multiple inflations had to be taken for the multiple segments of disease, but all initially were treated with a 5-mm conventional balloon up to the proximal superficial femoral artery. Waists were taken which resolved with angioplasty. The lesion in the mid superficial femoral artery had a good result after initial angioplasty. The above-knee popliteal artery still had a greater than 50% residual stenosis and I elected to treat this with a 5-mm diameter x 10-cm length Lutonix balloon. Following this, there was about a 30% to 40% residual stenosis. This did not appear flow limiting and after treatment for drug-coated balloon, I elected to leave this alone. Also in the proximal superficial femoral artery and common femoral artery, I used a 6-mm diameter x 10-cm in length Lutonix drug-coated balloon as this was a location where stent placement would not be a reasonable option. Following this, there was still some mild residual stenosis in the 40% range but this also did not appear flow limiting and this flow was markedly improved distally. At this point, I elected to terminate the procedure. The sheath was pulled back to the ipsilateral external iliac artery and oblique arteriogram was performed. StarClose closure device was deployed in the usual fashion with excellent hemostatic result. The patient tolerated the procedure well and was taken to the recovery room in stable condition.    ____________________________ Annice Needy, MD jsd:jm D: 10/04/2013 11:23:40 ET T: 10/04/2013 11:45:16 ET JOB#: [id number]  cc: Annice Needy, MD, <Dictator> Annice Needy MD ELECTRONICALLY SIGNED 10/08/2013 13:51

## 2014-12-21 NOTE — Op Note (Signed)
PATIENT NAME:  [name], [name] MR#:  [id number] DATE OF BIRTH:  Oct 25, 1934  DATE OF PROCEDURE:  10/05/2013  SURGEON:  Ricci Barkerodd W Jaikob Borgwardt, DPM  PREOPERATIVE DIAGNOSIS:  Gangrene right fourth and fifth toes.  POSTOPERATIVE DIAGNOSIS:  Gangrene right fourth and fifth toes.  PROCEDURE: Amputation right fourth and fifth toes at the metatarsophalangeal joint.   ANESTHESIA: Local MAC.   HEMOSTASIS: None.   ESTIMATED BLOOD LOSS: 25 mL  PATHOLOGY: Right fourth and fifth toes.   DRAINS: None.   COMPLICATIONS: None apparent.   OPERATIVE INDICATIONS: This is a 80 year old male with recent admission following a revascularization procedure on his right leg. The patient has been dealing with some dry gangrenous changes to his fourth toe and now fifth over the last month or two but now have become infected. A decision made for amputation of the toes now that he has been revascularized.   OPERATIVE PROCEDURE: The patient was taken to the operating room and placed on the table in the supine position. Following satisfactory sedation, the right foot was anesthetized with 10 mL of 0.5% Sensorcaine plain around the lateral forefoot. The foot was then prepped and draped in the usual sterile fashion. Attention was then directed to the distal aspect of the right foot where an incision was made from lateral to medial coursing from the lateral side of the fifth metatarsal across the base of the fifth and fourth toes ending in the third web space. A similar incision was then made along the plantar aspect, just proximal to the web crease. The incision was carried sharply down to the level of the bone and dissection was carried back to the level of the metatarsophalangeal joints where the toes were disarticulated and removed in toto. There was noted to be good healthy bleeding tissues. The wound was then flushed with copious amounts of sterile saline and closed using 5-0 nylon vertical mattress and simple interrupted sutures.  There was an area medially that was not able to be completely closed primarily and we will allow this to granulate in. Xeroform, sterile gauze, fluffs, Kerlix and an Ace wrap were then applied to the right foot. The patient tolerated the procedure and anesthesia well and was transported to the PACU with vital signs stable and in good condition.    ____________________________ Linus Galasodd Emmary Culbreath, DPM tc:ce D: 10/05/2013 15:37:22 ET T: 10/05/2013 19:35:33 ET JOB#: [id number]  cc: Linus Galasodd Jhoan Schmieder, DPM, <Dictator> [facility] Oliverio Cho DPM ELECTRONICALLY SIGNED 10/23/2013 9:56

## 2014-12-21 NOTE — Consult Note (Signed)
PATIENT NAME:  [name], [name] MR#:  [id number] DATE OF BIRTH:  [birth year]  DATE OF CONSULTATION:  12/25/2013  REFERRING PHYSICIAN:   CONSULTING PHYSICIAN:  Cristal Deerhristopher A. Neel Buffone, MD  REASON FOR CONSULTATION: Chronic right lateral leg wound.   HISTORY OF PRESENT ILLNESS: Mr. [name] is a pleasant 80 year old African American male with a history of COPD, chronic kidney problems, coronary artery disease, and peripheral vascular disease, who I had previously debrided a right lower extremity wound for wet gangrene. I have seen him as an outpatient twice, and he has been followed [facility] for this wound. He also has an ulcer on his right heel. He has been treated with a wound VAC at home. Otherwise, he has been doing well. He was admitted for altered mental status and hypoglycemia. He is thought to have a pneumonia. Currently he is being treated for that. Otherwise, no fevers, chills, night sweats, new-onset shortness of breath, cough, chest pain, abdominal pain, nausea, vomiting, diarrhea, constipation, dysuria or hematuria.   PAST MEDICAL HISTORY: 1.  End-stage renal disease, on hemodialysis.  2.  COPD.  3.  History of coronary artery disease.  4.  Diabetes. 5.  Hypertension.  6.  Dementia.  7.  Peripheral vascular disease, status post PTCA.  8.  Chronic lower extremity wound.  9.  History of AV graft placement in left upper extremity for dialysis.  10.  History of lung cancer resection.  11.  History of colon resection.   HOME MEDICATIONS: Percocet p.r.n., Advair, Anusol, cinacalcet, iron sulfate, insulin, Levemir, losartan, metoprolol, nitroglycerin, pravastatin, and Ventolin.   ALLERGIES: IV DYE AND SHELLFISH.   FAMILY HISTORY: Diabetes.   SOCIAL HISTORY: Denies tobacco and alcohol use currently.   ANESTHESIA: A 12-point review of systems was obtained. Pertinent positives and negatives as above.   PHYSICAL EXAMINATION: VITAL SIGNS: Temperature 98.4, pulse 85, blood pressure  162/71, respirations 18, 96% on room air.  GENERAL: No acute distress. Alert and oriented and interactive x 3.  HEAD: Normocephalic, atraumatic.  EYES: No scleral icterus. No conjunctivitis.  FACE: No obvious facial trauma. Normal external nose. Normal external ears.  CHEST: Lungs clear to auscultation. Moving air well.  HEART: Regular rate and rhythm. No murmurs, rubs or gallops.  ABDOMEN: Soft, nontender, nondistended.  EXTREMITIES: Does have an approximately 7 x 4 cm right lower lateral extremity wound with  granulation to an exposed tendon, which is viable. It appears to be smaller than previously. Also has an approximately 2 x 2 cm heel ulcer, which does appear improved as well. Moves all extremities well.  NEUROLOGIC: Cranial nerves II through XII grossly intact. Sensation intact, lower extremities and upper extremities.   LABORATORY DATA: Today, white cell count is 6.3, hemoglobin 8.8, hematocrit 28.5, platelets 191. Renal panel is unremarkable.   ASSESSMENT AND PLAN: Mr. [name] is a pleasant 80 year old admitted for a likely pneumonia, who I was asked to evaluate his leg wound, which I had previously debrided. It appears to be slowly healing. Recommend no intervention. Continue wound VAC, change every 3 days. Will follow up with wound care as outpatient. Has been seen in [facility] before.   ____________________________ Si Raiderhristopher A. Sharetta Ricchio, MD cal:jcm D: 12/25/2013 12:58:23 ET T: 12/25/2013 13:43:53 ET JOB#: [id number]  cc: Cristal Deerhristopher A. Cloteal Isaacson, MD, <Dictator> Jarvis NewcomerHRISTOPHER A Django Nguyen MD ELECTRONICALLY SIGNED 12/29/2013 8:36

## 2014-12-21 NOTE — H&P (Signed)
PATIENT NAME:  STANFORD, STRAUCH MR#:  [id number] DATE OF BIRTH:  27-Feb-1935  DATE OF ADMISSION:  10/04/2013  PRIMARY CARE PHYSICIAN: Neomia Dear. Harrington Challenger, MD  CHIEF COMPLAINT: Altered mental status and anemia.   HISTORY OF PRESENT ILLNESS: This is a 80 year old male who presented to the hospital for an elective angiogram. Prior to the angiogram, the patient was noted to have a hemoglobin of 5.9 and also noted to be somewhat confused with a fever of 101. Hospitalist services were contacted for further treatment and evaluation. The patient by himself, given his underlying dementia, cannot really give me a good history. The patient does have underlying encephalopathy from the fever he has presently. He denies any chest pain, any shortness of breath, any nausea, vomiting, abdominal pain, cough or any other associated symptoms presently.   REVIEW OF SYSTEMS:  CONSTITUTIONAL: No documented weight gain or weight loss. Positive fever. Positive generalized weakness.  EYES: No blurry or double vision.  ENT: No tinnitus. No postnasal drip. No redness of the oropharynx.  RESPIRATORY: No cough, no wheeze, no hemoptysis, no dyspnea.  CARDIOVASCULAR: No chest pain. No orthopnea. No palpitations. No syncope.  GASTROINTESTINAL: No nausea. No vomiting. No diarrhea. No abdominal pain. No melena or hematochezia.  GENITOURINARY: No dysuria or hematuria.  ENDOCRINE: No polyuria or nocturia. No heat or cold intolerance.  HEMATOLOGIC: No anemia, no bruising, no bleeding.  INTEGUMENTARY: No rashes or lesions.  MUSCULOSKELETAL: No arthritis. No swelling. No gout.  NEUROLOGIC: No numbness or tingling. No ataxia. No seizure activity.  PSYCHIATRIC: No anxiety, no insomnia, no ADD.   PAST MEDICAL HISTORY: Consistent with:  1. End-stage renal disease, on hemodialysis Monday, Wednesday, Friday.  2. History of previous coronary disease.  3. Hypertension.  4. Dementia.  5. History of peripheral vascular disease, status post  PTCA. 6. Sleep apnea.   PAST SURGICAL HISTORY: Consistent with:  1. AV graft placed in the left upper extremity.  2. Stent to the left upper extremity.  3. Lung cancer resection.  4. Partial colon resection.   SOCIAL HISTORY: No smoking. No alcohol abuse. No illicit drug abuse. Currently resides at [facility] skilled nursing facility.   FAMILY HISTORY: No significant family history of coronary artery disease. Positive for diabetes.   CURRENT MEDICATIONS: Based on his recent previous discharge summary are as follows: 1. Aspirin 81 mg daily.  2. Sublingual nitroglycerin as needed.  3. Metoprolol tartrate 25 mg daily.  4. Pravachol 20 mg daily. 5. Renvela 800 mg 3 tabs t.i.d. with meals.  6. Losartan 50 mg daily.  7. Albuterol inhaler 2 puffs 4 times daily as needed. 8.  Levemir insulin 25 units at bedtime. 9. MiraLax as needed.   PHYSICAL EXAMINATION: Presently, is as follows:  VITAL SIGNS: Temperature is 99.2, pulse 72, respirations 18, blood pressure 90/68, saturation is 100% on nonrebreather mask.  GENERAL: He is a lethargic-appearing male in mild distress.  HEAD, EYES, EARS, NOSE AND THROAT: He is atraumatic, normocephalic. Extraocular muscles are intact. Pupils equal and reactive to light. Sclerae anicteric. No conjunctival injection. No pharyngeal erythema.  NECK: Supple. There is no jugular venous distention. No bruits. No lymphadenopathy or thyromegaly.  HEART: Regular rate and rhythm. No murmurs. No rubs. No clicks.  LUNGS: Clear to auscultation bilaterally. No rales, no rhonchi, no wheezes.  ABDOMEN: Soft, flat, nontender, nondistended. Has good bowel sounds. No hepatosplenomegaly appreciated.  EXTREMITIES: No evidence of any cyanosis, clubbing or peripheral edema. Has +2 pedal and radial pulses bilaterally. The patient  has left upper arm AV fistula which has good bruit and good thrill with no drainage.  NEUROLOGICAL: He is alert, awake and oriented x1. Moves all  extremities spontaneously. Difficult to do a full neurological exam as he is encephalopathic.  SKIN: Moist and warm with no rashes.  LYMPHATIC: There is no cervical or axillary lymphadenopathy.   LABORATORY DATA: Showed a serum glucose of 64, BUN 19, creatinine 4.3, sodium 135, potassium 3.9, chloride 97, bicarbonate 34. White cell count is 8, hemoglobin 5.9, hematocrit 18.2, platelet count 178.   ASSESSMENT AND PLAN: This is a 80 year old male with a history of end-stage renal disease, on hemodialysis Monday, Wednesday, Friday, history of recent Staphylococcus capitis bacteremia with metabolic encephalopathy, secondary hyperparathyroidism, hypertension, dementia, history of peripheral vascular disease, history of coronary disease, who presents to the hospital for an elective angiogram, and prior to the procedure, noted to have a hemoglobin of 5.9, also noted to have a fever of 101.   1. Fever of unknown origin. The exact source of the fever is unclear presently. The patient was recently in the hospital with diverticulitis and also Staphylococcus capitis bacteremia. I will go ahead and empirically start the patient on vancomycin and Zosyn. Follow blood cultures. Will get infectious disease consult.  2. Anemia. This is likely anemia of chronic disease from his end-stage renal disease. There is no evidence of any acute bleeding. I will go ahead and transfuse him 2 units of packed red blood cells and follow hemoglobin. The patient likely will need EPO with dialysis.  3. End-stage renal disease, on hemodialysis. I will go ahead and consult nephrology. Resume his dialysis on Monday, Wednesday, Friday.  4. Altered mental status/encephalopathy. This is likely due to underlying dementia, complicated with possible underlying infectious process, although the source is unclear. Will treat him with broad-spectrum IV antibiotics with vancomycin and Zosyn and follow his mental status.  5. Hypertension. The patient's  blood pressure is presently tenuous and low. Therefore, will hold his antihypertensives for now.  6. Hyperlipidemia. Continue Pravachol. 7. Secondary hyperparathyroidism. Continue with his Renvela.   CODE STATUS: The patient is a full code.   TIME SPENT ON ADMISSION: 50 minutes.   ____________________________ Rolly PancakeVivek J. Cherlynn KaiserSainani, MD vjs:lb D: 10/04/2013 11:10:53 ET T: 10/04/2013 11:39:09 ET JOB#: [id number]  cc: Rolly PancakeVivek J. Cherlynn KaiserSainani, MD, <Dictator> [place] SirenVIVEK J SAINANI MD ELECTRONICALLY SIGNED 10/13/2013 17:58

## 2014-12-21 NOTE — H&P (Signed)
PATIENT NAME:  [name], FERO MR#:  [id number] DATE OF BIRTH:  06/20/1935  DATE OF ADMISSION:  12/24/2013  PRIMARY CARE PROVIDER: Dr. Raechel Ache.  EMERGENCY DEPARTMENT REFERRING PHYSICIAN: Dr. Kerman Passey.      CHIEF COMPLAINT: Altered mental status, hypoglycemia, pneumonia.   HISTORY OF PRESENT ILLNESS: The patient is a 80 year old African American male with multiple admissions to the hospital who also has a history of obstructive sleep apnea and is supposed to be on CPAP at bedtime; according to wife, he has been getting this intermittently at the facility, who is brought in because he was not acting his usual self and was confused. The patient was brought to the ED and he was also noticed to have hypoglycemia with the blood glucose of 33.  He also had an elevated WBC count and was thought to have pneumonia. The patient is being for this. He is currently awake and communicating, but falls asleep easily. His wife is at the bedside. She reports that he had upper respiratory cough and congestion episode recently and also has had chills. He states that he has chronic shortness of breath that is unchanged. He denies any abdominal pain, nausea, vomiting or diarrhea. Denies any urinary symptoms.   PAST MEDICAL HISTORY:  1.  End-stage renal disease on hemodialysis Monday, Wednesday, Friday.  2.  History of coronary artery disease.  3.  Obstructive sleep apnea. He is using CPAP intermittently at the facility.  4.  Diabetes.  5.  History COPD. 6.  Hypertension.  7.  Dementia.  8. History of peripheral vascular disease, status post PTCA.  9.  Chronic right lower extremity wound being followed by Dr. Rexene Edison of surgery;  has wound VAC in place.  PAST SURGICAL HISTORY: Status post AV graft placement in the left upper extremity, PTCA for peripheral vascular disease, lung cancer resection, partial colon resection.   ALLERGIES:  IVP DYE AND SHELLFISH.   FAMILY HISTORY: Diabetes.   SOCIAL HISTORY: Does  not smoke. Does not drink. Used to smoke, quit several years ago. No alcohol or drug use.   CURRENT MEDICATIONS: Acetaminophen/oxycodone 325/5 q.4 p.r.n. for pain, Advair 115/21 mcg 2 puffs b.i.d., Anusol HC 25 mg 1 suppository p.r.n., cinacalcet 60 mg daily, iron sulfate 325 mg 1 tab p.o. b.i.d., insulin aspartate 3 units t.i.d. with each meal, Levemir 10 units at bedtime, losartan 50 daily, metoprolol tartrate 25 mg 1 tab p.o. b.i.d., nitroglycerin 0.4 sublingual p.r.n., pravastatin 20 mg at bedtime, Renevia 2.5 grams 1 tablet 3 times a day, Ventolin 2 puffs q.6 p.r.n.   REVIEW OF SYSTEMS:  CONSTITUTIONAL: Generally, denies any fevers. Complains of chills.  HEENT: Denies any visual difficulties. No blurred vision. No cataracts. No glaucoma. Denies any tinnitus. Complains of nasal congestion. No difficulty swallowing.  CARDIOVASCULAR: Denies any chest pains or palpitations. Complains of dyspnea on exertion. No syncope.  PULMONARY: Complains of cough, nonproductive. A history of COPD.  No wheezing. Chronic shortness of breath. No hemoptysis.  GASTROINTESTINAL: Denies any nausea, vomiting, diarrhea.  GENITOURINARY: Denies any frequency, urgency or hesitancy.  HEMATOLOGIC:  Denies easy bruisability or bleeding. Has a history of mild chronic anemia.  NEUROLOGICAL: Has a history of dementia. No CVA, TIA or seizures.  PSYCHIATRIC: No anxiety, insomnia or ADD.  LYMPHATICS: Denies any lymph node enlargement.  VASCULAR: Denies any claudication symptoms.   PHYSICAL EXAMINATION: VITAL SIGNS: Temperature 97.9, pulse 65, respirations 16, blood pressure 198/93, O2 of 94% on room air.  GENERAL: The patient is a well-developed, well-nourished African  American male, currently falls asleep easily but able to answer questions.  HEENT: Head atraumatic, normocephalic. Nasal exam shows no drainage or ulceration.  Oropharynx is clear without any exudate.  NECK: Supple without any JVD.  CARDIOVASCULAR: Regular rate  and rhythm. No murmurs, rubs, clicks or gallops.  LUNGS: Has some occasional rhonchi in both bases, but no wheezing.  ABDOMEN: Soft, nontender, nondistended. Positive bowel sounds x 4.  EXTREMITIES: No clubbing, cyanosis or edema.  SKIN: He has a chronic right lower extremity wound with the wound VAC in place. He has an ulceration in the lower extremity at the heel, which is necrotic on the right foot on the heel.  NEUROLOGICAL: The patient is currently awake, but falls asleep easily, but then wakes up; able to answer all questions. Alert, oriented x 3. No focal deficits.  PSYCHIATRIC: Not anxious or depressed.  LYMPHATICS: No lymph nodes palpable.  VASCULAR: Good DP, PT pulses.   IMAGING AND LABORATORY DATA: CK-MB 1.8, CPK 43. Glucose 49, BUN 22, creatinine 5.74, sodium 136, potassium 3.0, chloride 95. Alk phos 147. ALT is 10. AST is 13. WBC 15.2, hemoglobin 11.2, platelet 291. Troponin less than 0.02. Chest x-ray shows stable right basilar opacity concerning for pneumonia. Subsegmental atelectasis or edema with associated minimal pleural effusion. Stable lingular opacities noted consistent with subsegmental atelectasis.   ASSESSMENT AND PLAN: The patient is a 80 year old African American male with previous history of end-stage renal disease, history of pneumonia in the past, comes in with altered mental status, hypoglycemia, leukocytosis.  1. Altered mental status, likely multifactorial including due to hyperglycemia as well as an acute , infection with pneumonia. At this time, we will treat the pneumonia and monitor his blood sugars.  2.  Pneumonia. We will treat him as hospital-acquired pneumonia in light of him being on dialysis in a skilled nursing facility with IV Levaquin, vancomycin and Zosyn. We will try to obtain a sputum. Monitor his respiratory status.  3.  Chronic obstructive pulmonary disease without any evidence of acute exacerbation. Continue inhalers. I will put him on nebulizers as  needed.  4.  Hypoglycemia, likely due to poor p.o. intake. We will hold his insulin. We will do Accu-Cheks every 2 hours. If blood sugars continue to stay low, then will place him on D5 solution.  5.  Chronic right lower extremity wound; does not appear to be infected. We will continue wound VAC as doing previously.  6.  End-stage renal disease. We will have nephrology come evaluate the patient for dialysis today.  7.  Coronary artery disease. I will start the patient on aspirin once a day.  8.  Accelerated hypertension. We will continue losartan, metoprolol. I am going to place him on p.r.n. hydralazine.  9.  Hyperlipidemia. Continue pravastatin.  10.  Miscellaneous: We will do heparin for DVT prophylaxis.     TIME SPENT: 55 minutes.    ____________________________ Lafonda Mosses Posey Pronto, MD shp:dmm D: 12/24/2013 09:15:45 ET T: 12/24/2013 09:30:29 ET JOB#: [id number]  cc: Marquesa Rath H. Posey Pronto, MD, <Dictator> Alric Seton MD ELECTRONICALLY SIGNED 12/25/2013 8:14

## 2014-12-21 NOTE — Consult Note (Signed)
Details:   - Consult received. Pt off floor.  Will f/u today or tomorrow.   Electronic Signatures: Gwyneth RevelsFowler, Taz Vanness (MD)  (Signed 08-Jan-15 12:59)  Authored: Details   Last Updated: 08-Jan-15 12:59 by Gwyneth RevelsFowler, Savio Albrecht (MD)

## 2014-12-21 NOTE — Consult Note (Signed)
PATIENT NAME:  [name], [name] MR#:  [id number] DATE OF BIRTH:  1935/07/23  INFECTIOUS DISEASE CONSULTATION NOTE  DATE OF CONSULTATION:  09/07/2013  REFERRING PHYSICIAN:  Vivek J. Cherlynn Kaiser, MD CONSULTING PHYSICIAN:  Stann Mainland. Sampson Goon, MD  REASON FOR CONSULTATION: Bacteremia, fever, altered mental status.   HISTORY OF PRESENT ILLNESS: This is a 80 year old gentleman with end-stage renal disease, on hemodialysis, as well as gangrenous toe and diabetes. He was admitted January 5th for weakness, shortness of breath, elevated troponins and confusion. Since admission, the patient has been found to have coag-negative Staphylococcus bacteremia in 2 of 2 bottles. He has also developed leukocytosis and abdominal pain and become more altered. He was diagnosed with diverticulitis in the left lower quadrant. He has been seen by GI as well as by surgery, but he is a poor surgical candidate at this time. We are consulted for further antibiotic assistance.   PAST MEDICAL HISTORY:  1. End-stage renal disease, gets hemodialysis through a left AV graft.  2. Diabetes.  3. Hypertension.  4. Coronary artery disease.  5. Lung cancer status post resection.  6. Colon cancer status post surgery.  7. Peripheral vascular disease, status post PTA.  8. Sleep apnea.  9. Hyperlipidemia. 10. Blindness in the right eye.  PAST SURGICAL HISTORY:  1. AV graft, left upper extremity, with recent PTA of it.  2. Lung cancer resection.  3. Partial colon resection.   SOCIAL HISTORY: The patient lives with his wife. Does not drink, smoke or use drugs.   FAMILY HISTORY: Positive for end-stage renal disease, coronary artery disease.   REVIEW OF SYSTEMS: Unable to be obtained.  MEDICATIONS: Current antibiotics include vancomycin given at hemodialysis. Other medications include Epogen, aspirin, hydralazine, sevelamer, multivitamin, Colace, metoprolol, albuterol, insulin, losartan, nitroglycerin, aspirin, pantoprazole and  Zofran.   ALLERGIES: THE PATIENT IS ALLERGIC TO IV DYE AS WELL AS SHELLFISH.   REVIEW OF SYSTEMS: Unable to obtain.   PHYSICAL EXAMINATION:  VITAL SIGNS: T-max over the last 24 hours is 100.3 on January 8th at 8:00 p.m. Prior to that, his temperature was 102 on January 7th and 101.7 on January 6th. Pulse 91, blood pressure 101/57, pulse oximetry 98% on 2 liters.  GENERAL: He is awake but has great difficulty following commands or answering questions. He does not know where he is.  HEENT: His pupils are equal, round and reactive to light and accommodation. His extraocular movements are intact. There is no icterus. His mucous membranes were dry and tacky.  NECK: Supple. There is no anterior cervical or posterior cervical or supraclavicular lymphadenopathy.  HEART: Regular.  LUNGS: Clear.  ABDOMEN: Soft, but distended. He has marked tenderness in his left lower quadrant. He grimaces when you percuss as well.  EXTREMITIES: He has 1+ edema bilaterally. His right fourth and fifth toes are gangrenous, but there is no drainage or odor. Left upper extremity has an AV fistula with no tenderness, redness or erythema. There is no warmth.  NEUROLOGIC: He is awake and able to open his eyes and mumble a few words, but he is not oriented and is not really able to follow commands.   DATA: Blood cultures x2 done January 6th reveal staph capitis. Very similar sensitivities, but not identical. Blood culture followup on January 8th is negative. White blood count on admission was 10.1, peaked at 19.7 on January 5th, down to 17.8 today. Hemoglobin is 8.6, platelets are 240. Renal function is consistent with end-stage renal disease. LFTs on admit showed low albumin  at 2.7, elevation of AST at 145, ALT at 45, alkaline phosphatase was normal.   IMAGING: CAT scan done January 8th of the chest reveals no evidence for pneumonia. There is emphysema and diffuse arteriosclerosis. CAT scan of the abdomen and pelvis done January  6th compared with from the day of admission shows large amount of stool in the sigmoid colon with new wall thickening and inflammation when compared to prior CT, suggestive of diverticulitis. Echocardiogram done January 6th reveals EF 65% to 70%. No vegetation is noted.   IMPRESSION: A 80 year old with end-stage renal disease, on hemodialysis, now with altered mental status, diverticulitis, gangrenous toes due to peripheral vascular disease and Staphylococcus capitis coag-negative staphylococcal bacteremia. Overall, he is relatively ill and seems to be a poor surgical candidate.   1. Diverticulitis. I will change his cefepime to ertapenem to give better anaerobic coverage. He has been seen by surgery and GI. This is likely the cause of his abdominal pain and his fevers and leukocytosis.  2. Staphylococcus capitis bacteremia. This is in 2 out of 2 bottles. It may be related to his gangrene, but this also potentially could seed his AV graft. At this point, blood cultures followup are pending and are negative. If they remain negative, I would suggest a 2-week course of vancomycin which he can receive at dialysis, with a goal trough of 15 to 20. Following cessation of his vancomycin, I would suggest a set of followup blood cultures at hemodialysis 1 week later.  3. Altered mental status. I do not believe this is related to CNS infection. Would continue treatment for his ongoing infections in his abdomen and his bacteremia.   Thank you for the consult. I will be glad to follow with you.   ____________________________ Stann Mainlandavid P. Sampson GoonFitzgerald, MD dpf:lb D: 09/07/2013 08:58:08 ET T: 09/07/2013 09:35:53 ET JOB#: [id number]  cc: Stann Mainlandavid P. Sampson GoonFitzgerald, MD, <Dictator> Ramond Darnell Sampson GoonFITZGERALD MD ELECTRONICALLY SIGNED 09/07/2013 22:17

## 2014-12-21 NOTE — Op Note (Signed)
PATIENT NAME:  [name], [name] MR#:  [id number] DATE OF BIRTH:  April 18, 1935  DATE OF PROCEDURE:  06/05/2014  PREOPERATIVE DIAGNOSES:  1.  Peripheral arterial disease with ulceration right lower extremity.  2.  End-stage renal disease.  3.  Hypertension.  4.  Diabetes.   POSTOPERATIVE DIAGNOSES:  1.  Peripheral arterial disease with ulceration right lower extremity.  2.  End-stage renal disease.  3.  Hypertension.  4.  Diabetes.   PROCEDURE:   1.  Ultrasound guidance for vascular access to left femoral artery.  2.  Catheter placement into right popliteal artery from left femoral approach.  3.  Aortogram and selective right lower extremity angiogram.  4.  Percutaneous transluminal angioplasty with drug-coated angioplasty balloon to the proximal right superficial femoral artery with a 6 mm diameter drug-coated Lutonix angioplasty balloon.  5.  Self-expanding stent placement to the right superficial femoral artery for dissection and greater than 50% residual stenosis after angioplasty.  6.  StarClose closure device left femoral artery.   SURGEON: [name] Needy, MD.   ANESTHESIA: Local with moderate conscious sedation.   ESTIMATED BLOOD LOSS: 25 mL.    FLUOROSCOPY TIME: Approximately 6 minutes.   CONTRAST USED: 65 mL.   INDICATION FOR PROCEDURE: A 80 year old gentleman who has known severe peripheral vascular disease. He has nonhealing ulceration and possible infection of the right foot. I was contacted by his podiatrist who needs to do further work on him and is very concerned about his wound healing potential particularly given his diabetes and renal failure. A recent noninvasive study showed elevated velocities proximal to his previous SFA stents and previous intervention. For this reason he is brought in for angiography for further evaluation and potential treatment. Risks and benefits were discussed. Informed consent was obtained.   DESCRIPTION OF PROCEDURE: The patient is brought  to the vascular suite. Groins are shaved, prepped, and a sterile surgical field is created. The left femoral head is visualized with ultrasound and left femoral artery seen with ultrasound and found to be patent, but very calcific. It was accessed in a patent portion of the left femoral artery under direct ultrasound guidance and a permanent image was recorded. We used a micropuncture wire and micropuncture sheath and then used a stiff wire and upsized to a 5 [place] sheath. A pigtail catheter was placed in the aorta at the L1-L2 level, then pulled down to the aortic bifurcation where pelvic obliques were performed. This demonstrated patent left common iliac artery stent. The aorta and iliac arteries did not have any high-grade stenosis, the renal artery flow was sluggish consistent with end-stage renal disease. I then crossed the aortic bifurcation and advanced to the right femoral head. Selective right lower extremity angiogram was performed, this showed calcific lesion proximal to the previous stent placed in the SFA and the proximal SFA, there was also stenosis near the origin of the SFA, it was very calcific. Both of these lesions appeared to be greater than 60%.  There was about a 30%-40% stenosis in the popliteal artery that did not appear flow limiting and 2 vessel runoff distally. I elected to treat the proximal SFA, so 6 [place] Ansell sheath was placed over a Terumo Advantage wire.  I crossed the lesion without difficulty with a Terumo Advantage wire and a Kumpe catheter and parked the Kumpe catheter in the popliteal artery confirming intraluminal flow, then replaced the wire. The patient was given 3500 units of intravenous heparin. A 6 mm diameter x 15 cm  in length Lutonix drug-coated balloon was inflated from the origin of the SFA down into the previously placed stent. Waists and narrowing were seen with balloon inflation that resolved with angioplasty. After angioplasty however there was significant  dissection and residual stenosis in the proximal SFA and I elected to cover this with a 6 mm diameter x 15 cm length self-expanding stent. This was post dilated with a 6 mm balloon with an excellent angiographic completion result, no hemodynamically significant residual stenosis.  At this point I elected to terminate the procedure. The sheath was pulled back to the ipsilateral external iliac artery and oblique arteriogram was performed. StarClose closure device was deployed in the usual fashion with excellent hemostatic result. The patient tolerated the procedure well and was taken to the recovery room in stable condition.    ____________________________ [name] NeedyJason S. Olan Kurek, MD jsd:bu D: 06/05/2014 11:33:12 ET T: 06/05/2014 13:53:02 ET JOB#: [id number]  cc: [name] NeedyJason S. Lux Skilton, MD, <Dictator> [name] Iraniavid M. Terance HartBronstein, MD  [name] NeedyJASON S Jaxton Casale MD ELECTRONICALLY SIGNED 06/11/2014 10:23

## 2014-12-21 NOTE — Consult Note (Signed)
PATIENT NAME:  [name], [name] MR#:  [id number] DATE OF BIRTH:  1935-04-09  DATE OF CONSULTATION:  11/19/2013  REFERRING PHYSICIAN:   CONSULTING PHYSICIAN:  Cristal Deerhristopher A. Gabriellia Rempel, MD  REASON FOR CONSULTATION: Right leg ulcer with necrosis.   HISTORY OF PRESENT ILLNESS: [name] is a pleasant 80 year old with multiple health issues including obstructive sleep apnea, end-stage renal disease on dialysis, history of coronary artery disease, hypertension, diabetes, dementia, peripheral vascular disease, who was admitted for shortness of breath. He had a right lateral leg ulcer which I was consulted on. According to him, it has been there for years. It is nontender. He is currently improved as far as respiratory status is concerned. No recent fevers, chills, weakness, shortness of breath, chest pain, abdominal pain, nausea, vomiting, diarrhea, constipation, dysuria or hematuria.   PAST MEDICAL HISTORY: 1.  End-stage renal disease, on hemodialysis on Monday, Wednesday, Friday.  2,  History of prior coronary artery disease.  3.  History of OSA, on CPAP.  4.  Diabetes mellitus.  5.  History of COPD. 6.  Hypertension.  7.  Dementia.  8.  History of peripheral vascular disease, status post PTCA.    PAST SURGICAL HISTORY:  1.  History of AV graft in the left upper extremity.  2.  History of lung cancer resection.  3.  History of partial colon resection.   ALLERGIES: ALLERGIC TO IV DYE AND SHELLFISH.   HOME MEDICATIONS:  1.  Ventolin.  2.  Prevacid.  3.  Sevelamer.   4.  Nitroglycerin.  5.  Nepro. 6.  Levemir. 7.  Cipro.  8.  Tylenol p.r.n.    SOCIAL HISTORY: [place] skilled [place] History of tobacco use, no longer uses.  No alcohol use.   FAMILY HISTORY: Diabetes mellitus.   REVIEW OF SYSTEMS: Has underlying dementia but has no other complaints. Total review of systems was obtained as above.   PHYSICAL EXAMINATION: VITAL SIGNS: Temperature 97.8, pulse  68, blood pressure 154/63, respirations 18, 96% on room air.  GENERAL: No acute distress, interactive, answers appropriately.  HEAD: Normocephalic, atraumatic.  EYES: No scleral icterus sinus. No conjunctivitis. FACE: No facial trauma. Normal external nose. Normal external ears.  CHEST: Lungs clear to auscultation. Moving air well.  HEART: Regular rate and rhythm.   No murmurs, rubs or gallops.  ABDOMEN: Soft, nontender, nondistended.  EXTREMITIES: Has a small healing left heel ulcer, has an approximately 8 x 3 cm right lateral calf ulcer with significant necrosis which tunnels deep to this ulcer and even deeper.   NEUROLOGIC: Cranial nerves II through XII grossly intact.   LABORATORY, DIAGNOSTIC AND RADIOLOGICAL DATA: At admission white cell count was 6.8, currently 5.8. Creatinine currently is 6.4 earlier today before dialysis.   ASSESSMENT AND PLAN: [name] is a pleasant 80 year old with a history of end-stage renal disease, peripheral vascular disease, who has a necrotic leg wound in need of debridement. I have spoken to him and his wife regarding debridement and I have explained all benefits, potential complications. Informed consent was obtained.  We will plan on going to the Operating Room for debridement tomorrow.    ____________________________ Si Raiderhristopher A. Vauda Salvucci, MD cal:cs D: 11/19/2013 16:39:00 ET T: 11/19/2013 18:19:55 ET JOB#: [id number]  cc: Cristal Deerhristopher A. Zamyah Wiesman, MD, <Dictator> Jarvis NewcomerHRISTOPHER A Cambrey Lupi MD ELECTRONICALLY SIGNED 11/28/2013 12:25

## 2014-12-21 NOTE — Op Note (Signed)
PATIENT NAME:  [name], [name] MR#:  [id number] DATE OF BIRTH:  03/23/1935  DATE OF PROCEDURE:  04/30/2014  PREOPERATIVE DIAGNOSES: 1.  Atherosclerotic occlusive disease, bilateral lower extremities, with ulceration of the left foot.  2.  End-stage renal disease requiring hemodialysis.  3.  Complication of arteriovenous dialysis access with poor flow and worsening Kt/V.   POSTOPERATIVE DIAGNOSES:  1.  Atherosclerotic occlusive disease, bilateral lower extremities, with ulceration of the left foot.  2.  End-stage renal disease requiring hemodialysis.  3.  Complication of arteriovenous dialysis access with poor flow and worsening Kt/V.   PROCEDURE PERFORMED: 1.  Abdominal aortogram.  2.  Right lower extremity distal runoff, third order catheter placement.  3.  Percutaneous transluminal angioplasty to 5 mm using the Lutonix balloons, right superficial femoral artery.  4.  Percutaneous transluminal angioplasty and stent placement, left common iliac artery.  5.  Contrast injection, left arm brachial axillary dialysis graft.  6.  Percutaneous transluminal angioplasty of the venous outflow, left arm brachial axillary dialysis graft.   PROCEDURE PERFORMED BY:  Renford Dills, MD  SEDATION:  Versed 5 mg, plus fentanyl 200 mcg administered IV. Continuous ECG, pulse oximetry and cardiopulmonary monitoring was performed throughout the entire procedure by the interventional radiology nurse. Total sedation time was 2 hours.   CONTRAST USED: Isovue 115 mL.   FLUOROSCOPY TIME: Approximately 10 minutes.   INDICATIONS: [name] is a 80 year old gentleman who has multiple problems. He originally was being evaluated for his atherosclerotic occlusive disease of the lower extremities, with ulcerations of his right foot, but in the interim he has developed significant problems with his dialysis access and has been sent from dialysis for treatment. The risks and benefits of both treating his lower  extremity occlusive disease, as well as his dialysis access problems were described in detail. All questions answered. The patient has agreed to proceed.   DESCRIPTION OF PROCEDURE: The patient is taken to special procedures and placed in the supine position. Lidocaine 1% is infiltrated in the soft tissues overlying the left arm AV graft. His arm is positioned, extended palm upward, and subsequently a micropuncture needle is inserted into the graft, microwire followed by micro sheath, J-wire followed by a 6 French sheath. Having established a vascular access, he is then given sedation. This is being performed through his AV access because a previous attempt at inserting a peripheral IV was unsuccessful and the patient refused to proceed with the treatment at that time. This was the only manner in which access could be established which was acceptable to the patient.   A hand injection of contrast is then used to demonstrate the brachioaxillary graft and the central veins. After review of the images, there is a high-grade stenosis at the venous outflow. It is within a previously stented segment, and therefore 3000 units of heparin is given and a J-wire is advanced through the lesion. A 9 x 4 [place] balloon is then used to angioplasty the venous outflow. Three serial angioplasties are performed each is to 26 to 30 atmospheres for 1 to 2 minutes. With the balloon inflated, reflux of contrast is used to demonstrate the arterial portion. Followup imaging by hand injection after the angioplasty demonstrates an excellent result and attention is then turned to his lower extremities.   Attention is then turned to the left groin. Ultrasound is placed in a sterile sleeve and the left common femoral artery is identified. It is echolucent and pulsatile indicating patency. Image is recorded  for the permanent record and access to the common femoral artery is made with a micropuncture needle under direct visualization.  Lidocaine 1% was infiltrated in the soft tissues.   Because of the patient's body habitus, stiff micropuncture was required and subsequently an Amplatz wire was negotiated into the aorta followed by a 5 French sheath and a pigtail catheter. The pigtail catheter is positioned at the level of T12, and AP projection of the aorta is obtained. Pigtail catheter is repositioned to above the bifurcation and bilateral oblique views of the pelvis are obtained. Using the pigtail catheter and the stiff angled Glidewire, the aortic bifurcation is crossed and the catheter is advanced down to the distal external where an RAO projection of the groin is obtained. After review of this, the wire is reintroduced. The catheter is advanced into the SFA and distal runoff is obtained. Diffuse disease is noted throughout the majority of the SFA with 3 areas of critical stenosis. The mid and distal popliteal are widely patent, and there is 2 vessel runoff  to the foot via the posterior tibial and the peroneal, although the peroneal is quite small. Anterior tibial is occluded.   Another 1000 units of heparin is given.  Versacore wire is introduced, and a 6 [place] Ra[place] sheath is advanced up and over the bifurcation, positioned with its tip in the proximal SFA. Using a 5 x 150 Lutonix balloon and subsequently a 5 x 60 Lutonix balloon, the SFA is angioplastied. Both inflations are to 14 atmospheres for 3 full minutes. Followup imaging through the sheath demonstrates an excellent result with preservation of the distal runoff. The sheath is then pulled into the aortic bifurcation and multiple views of the aortic bifurcation are created in magnified imaging. This was performed because significant disease was noted on the left and there was a question of significant disease on the right, and the concern for kissing balloon and stents was raised; however, the right in multiple magnified views demonstrated that it was less than 30% narrowed. Left  side was greater than 80% narrowed and subsequently a light star stent was deployed 8 x 60 and then postdilated using a 7 x 60 balloon. Followup imaging demonstrated excellent result. The left groin was then imaged in oblique view and a StarClose device deployed. There were no immediate complications.   INTERPRETATION: Initial images of the AV graft demonstrate high-grade stenoses within the previously stented segments of the venous outflow. These are treated with 8 mm balloon inflations. I opted not to put a third stent in this region given the adequate result obtained after  9 mm inflation. Central veins are widely patent as are the arterial portion.   The aorta is imaged. There is no flow-limiting stenoses, although there is diffuse disease. There are high-grade flow-limiting stenoses at the left common iliac. Left external and right external are widely patent. There is mild stenosis of the right common iliac.   The right common femoral is diffusely diseased with approximately 40% to 50% stenosis. Profunda femoris is widely patent. SFA is patent proximally, then shows diffuse significant disease with multiple subtotal occlusions. The previously placed stent is patent. Popliteal is patent distally with 2 vessel runoff, although the peroneal is somewhat small. The posterior tibial is dominant vessel to the foot.   Following angioplasty of the SFA, there is now complete resolution of the diseased portions, and following angioplasty and stent placement of the common iliac, there is resolution of this lesion as well.  SUMMARY: Successful treatment of both the left common iliac, as well as the right SFA and the venous outflow of his AV graft.  ____________________________ Renford Dills, MD ggs:LT D: 05/01/2014 10:51:00 ET T: 05/01/2014 17:12:36 ET JOB#: [id number]  cc: Renford Dills, MD, <Dictator> Renford Dills MD ELECTRONICALLY SIGNED 05/07/2014 22:40

## 2014-12-21 NOTE — Consult Note (Signed)
Brief Consult Note: Diagnosis: Coag neg staph bacteremia, diverticulitis, Encephalopathy, Gangrene.   Patient was seen by consultant.   Consult note dictated.   Recommend further assessment or treatment.   Orders entered.   Comments: Change cefepime to ertapenem for Diverticulits cont vanco x 2 weeks at HD if fu bcx remain neg Doubt CNS Infection.  Electronic Signatures: Heavenly Christine, DavDierdre Harnessid Patrick (MD)  (Signed 09-Jan-15 08:59)  Authored: Brief Consult Note   Last Updated: 09-Jan-15 08:59 by Dierdre HarnessFitzgerald, Mayfield Schoene Patrick (MD)

## 2014-12-21 NOTE — Consult Note (Signed)
Referring Physician:  Demetrios Loll :   Primary Care Physician:  Demetrios Loll : [facility], [address], [place], [place] [address], [place] [phone]  Reason for Consult: Admit Date: 03-Sep-2013  Chief Complaint: weakness  Reason for Consult: weakness   History of Present Illness: History of Present Illness:   80 yo RHD M presents to [facility] secondary to generalized weakness more notable in his legs.  Pt normally ambulates fine except over the past 3 days he has been unable to get up without assistance.  Today pt can not even get out of bed.  Family also reports confusion which has worsened over the past two days.  They note that he has some unusual movements as well.    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  PAST SURGICAL HISTORY: AV graft placement to the left upper extremity for dialysis, stent and PTCA to the left upper extremity, lung cancer resection, both lobes, and partial colon resection.   Past Surgical History as above   Home Medications: Medication Instructions Last Modified Date/Time  acetaminophen-oxyCODONE 325 mg-5 mg tablet 1-2 tab(s) orally every 4-6 hours- as needed  05-Jan-15 08:38  ondansetron 4 mg oral tablet 1 tab(s) orally every 8 hours, As Needed- for Nausea, Vomiting  05-Jan-15 08:38  Lantus 100 units/mL subcutaneous solution 28 unit(s) subcutaneous once a day (at bedtime)  05-Jan-15 08:38  aspirin 81 mg oral tablet 1 tab(s) orally once a day at lunch time. 05-Jan-15 08:38  Colace sodium 100 mg oral capsule 1 cap(s) orally 2 times a day 05-Jan-15 08:38  losartan 50 mg oral tablet 1 tab(s) orally once a day 05-Jan-15 08:38  Renal Caps Vitamin B Complex with C and Folic Acid oral capsule 1 cap(s) orally once a day 05-Jan-15 08:38  albuterol CFC free 90 mcg/inh inhalation aerosol 2 puff(s) inhaled 4 times a day, As Needed - for Shortness of Breath  05-Jan-15 08:38  MiraLax oral powder for reconstitution 13 gram(s) orally once a day, As Needed 05-Jan-15 08:38  nitroglycerin 0.4 milligram(s) orally , As Needed- for Chest Pain  05-Jan-15 08:38  Humalog 100 units/mL subcutaneous solution sliding scale subcutaneous , As Needed 05-Jan-15 08:38  metoprolol 25 mg oral tablet 1 tab(s) orally once a day 05-Jan-15 08:38  gabapentin 600 mg oral tablet 1 tab(s) orally 2 times a day 05-Jan-15 08:38  pravastatin 20 mg oral tablet 1 tab(s) orally once a day (at bedtime) 05-Jan-15 08:38  sevelamer carbonate 800 mg oral tablet 3 tab(s) orally 3 times a day with meals 05-Jan-15 08:38   Allergies:  Shellfish: Swelling, Rash  IVP Dye: Hives  Allergies:  Allergies as above   Social/Family History: Employment Status: retired  Lives With: spouse  Living Arrangements: house  Social History: no tob, no EtOH, no illicits  Family History: n/c   Vital Signs: **Vital Signs.:   06-Jan-15  14:28  Vital Signs Type Recheck  Temperature Temperature (F) 98.7  Celsius 37  Temperature Source oral  Pulse Pulse 89  Respirations Respirations 20  Systolic BP Systolic BP 903  Diastolic BP (mmHg) Diastolic BP (mmHg) 67  Mean BP 97  Pulse Ox % Pulse Ox % 95  Pulse Ox Activity Level  At rest  Oxygen Delivery 3L   Physical Exam: General: overweight, lethargic, no acute distress  HEENT: normocephalic, sclera nonicteric, oropharynx clear  Neck: supple, no JVD, no bruits  Chest: CTA B, no  wheezing, decent movement, + abdominal pain  Cardiac: RRR, no murmurs, no edema, 2+ pulses  Extremities: no C/C/E, FROM   Neurologic Exam: Mental Status: lethargic and oriented only to person, intermittently follows, moderate dysarthria  Cranial Nerves: PERRLA, EOMI, nl VF to threat, face symmetric, tongue midline, shoulder shrug equal  Motor Exam: 3+/5 B, severe myoclonus noted, nl tone  Deep Tendon Reflexes: 1+/4 B, mute plantars  Sensory Exam: localizes to pain   Lab Results:  Thyroid:  06-Jan-15 11:14   Thyroid Stimulating Hormone 1.70 (0.45-4.50 (International Unit)  ----------------------- Pregnant patients have  different reference  ranges for TSH:  - - - - - - - - - -  Pregnant, first trimetser:  0.36 - 2.50 uIU/mL)  LabObservation:  06-Jan-15 07:39   OBSERVATION Reason for Test  Hepatic:  05-Jan-15 06:51   Bilirubin, Total 0.3  Alkaline Phosphatase 100 (45-117 NOTE: New Reference Range 07/20/13)  SGPT (ALT) 45  SGOT (AST)  145  Total Protein, Serum 7.5  Albumin, Serum  2.7  General Ref:  05-Jan-15 10:26   Hepatitis B Surface Antibody, Qual ========== TEST NAME ==========  ========= RESULTS =========  = REFERENCE RANGE =  HEPATITIS B SURF.AB,QUAL  Hep B Surface Ab Hep B Surface Ab, Qual          [   Reactive             ]                                                Non Reactive: Inconsistent with immunity,                                            less than 10 mIU/mL                              Reactive:     Consistent with immunity,                                            greater than 9.9 mIU/mL               [facility]            No: [id number]           [address] [place], [place] [address]           Lindon Romp, MD         [phone]   Result(s) reported on 04 Sep 2013 at 04:49AM.  11:40   Parathyroid Hormone, Intact ========== TEST NAME ==========  ========= RESULTS =========  = REFERENCE RANGE =  PTH  INTACT  PTH, Intact PTH, Intact                     [facility]  661 pg/mL            ]             [address]               [facility]            No: [id number]           [address], [place], [place] [address]           Lindon Romp, MD         [phone]   Result(s) reported on 04 Sep 2013 at 09:50AM.  Routine Chem:  05-Jan-15 06:51   Glucose, Serum  197  BUN  63  Creatinine (comp)  10.09  Sodium, Serum  130  Potassium, Serum  5.6  Chloride, Serum  94  CO2, Serum 25  Calcium (Total), Serum 9.6  Osmolality (calc) 284  eGFR (African American)  5  eGFR (Non-African American)  4 (eGFR values <82m/min/1.73 m2 may be an indication of chronic kidney disease (CKD). Calculated eGFR is useful in patients with stable renal function. The eGFR calculation will not be reliable in acutely ill patients when serum creatinine is changing rapidly. It is not useful in  patients on dialysis. The eGFR calculation may not be applicable to patients at the low and high extremes of body sizes, pregnant women, and vegetarians.)  Anion Gap 11    11:40   Phosphorus, Serum  6.7 (Result(s) reported on 03 Sep 2013 at 12:36PM.)  Result Comment TROPONIN - RESULTS VERIFIED BY REPEAT TESTING.  - ELEVATED TROPONIN PREVIOUSLY CALLED ON  - 09-03-13 AT 0741.VKB  Result(s) reported on 03 Sep 2013 at 12:35PM.  06-Jan-15 11:14   Ammonia, Plasma 26 (Result(s) reported on 04 Sep 2013 at 12:09PM.)  Cardiac:  05-Jan-15 06:51   CPK-MB, Serum  6.2    11:40   Troponin I  0.07 (0.00-0.05 0.05 ng/mL or less: NEGATIVE  Repeat testing in 3-6 hrs  if clinically indicated. >0.05 ng/mL: POTENTIAL  MYOCARDIAL INJURY. Repeat  testing in 3-6 hrs if  clinically indicated. NOTE: An increase or decrease  of 30% or more on serial  testing suggests a  clinically important change)  06-Jan-15 11:14   CK, Total  976 (Result(s) reported on 04 Sep 2013 at 03:32PM.)  Routine Hem:  05-Jan-15 06:51   WBC (CBC) 10.6   RBC (CBC)  3.67  Hemoglobin (CBC)  10.3  Hematocrit (CBC)  32.3  Platelet Count (CBC) 238 (Result(s) reported on 03 Sep 2013 at 07:07AM.)  MCV 88  MCH 28.1  MCHC  31.9  RDW  17.7  06-Jan-15 11:14   Erythrocyte Sed Rate  126 (Result(s) reported on 04 Sep 2013 at 03:45PM.)   Radiology Results: CT:    06-Jan-15 10:45, CT Head Without Contrast  CT Head Without Contrast   REASON FOR EXAM:    Altered mental status/Lethargy.  COMMENTS:       PROCEDURE: CT  - CT HEAD WITHOUT CONTRAST  - Sep 04 2013 10:45AM     CLINICAL DATA:  Altered mental status.  Lethargy.    EXAM:  CT HEAD WITHOUT CONTRAST    TECHNIQUE:  Contiguous axial images  were obtained from the base of the skull  through the vertex without intravenous contrast.    COMPARISON:  11/10/2012.  FINDINGS:  No intracranial hemorrhage.    Remote lacune infarct right basal ganglia.    Small vessel disease type changes without CT evidence of large acute  infarct.    Vascular calcifications.    Global atrophy without hydrocephalus.    Partially empty sella.    No intracranial mass lesion noted on this unenhanced exam.  Ossification of the falx most notable anteriorly stable in  appearance.     IMPRESSION:  No intracranial hemorrhage or CT evidence of large acute infarct.    Atrophy without hydrocephalus.    Vascular calcifications.      Electronically Signed    By: Chauncey Cruel M.D.    On: 09/04/2013 10:49     Verified By: Doug Sou, M.D.,   Radiology Impression: Radiology Impression: CT of head personally reviewed by me and shows moderate atrophy and white matter changes   Impression/Recommendations: Recommendations:   labs reviewed by me notes reviewed by me d/w referring physician   Encephalopathy-  moderate to severe, etiology unclear but appears metabolic with significant myoclonus noted but could be infectious or nutritional as well Weakness-  this is most likely secondary to 1. but there  could be an underlying myositis as well with increased CPK vs. statin induced myositis White matter changes-  stable, not cause of above EEG tomorrow hold statin and sedating medications check ANA, ESR, myoglobin, aldolase, procalcitonin start lactulose 50m BID regardless ammonia level start Keppra 2515mBID for myoclonus continue broad spectrum antibiotics needs ASA daily will follow  Electronic Signatures: SmJamison NeighborMD)  (Signed 06-Jan-15 18:14)  Authored: REFERRING PHYSICIAN, Primary Care Physician, Consult, History of Present Illness, Review of Systems, PAST MEDICAL/SURGICAL HISTORY, HOME MEDICATIONS, ALLERGIES, Social/Family History, NURSING VITAL SIGNS, Physical Exam-, LAB RESULTS, RADIOLOGY RESULTS, Recommendations   Last Updated: 06-Jan-15 18:14 by SmJamison NeighborMD)

## 2014-12-21 NOTE — H&P (Signed)
PATIENT NAME:  [name], [name] MR#:  [id number] DATE OF BIRTH:  April 06, 1935  DATE OF ADMISSION:  11/16/2013  PRIMARY CARE PHYSICIAN:  Neomia Dearavid N. Harrington Challengerhies, MD  REFERRING DOCTOR: Darien Ramusavid W. Kaminski, MD  CHIEF COMPLAINT: Shortness of breath.   HISTORY OF PRESENT ILLNESS: The patient is a [age] year old African American male with a past medical history of obstructive sleep apnea with use of CPAP at bedtime, end-stage renal disease on hemodialysis on Monday, Wednesday, and Friday, last dialysis was done today, history of coronary artery disease, hypertension, diabetes mellitus, chronic dementia, peripheral vascular disease status post PTCA, who was brought into the ER for acute onset of shortness of breath since this afternoon. According to the son at bedside,  patient has been having chest congestion and has been coughing since yesterday. He is bringing up yellowish phlegm according to the son. No fevers. Today he became acutely short of breath and by the time EMS went to bring the patient in he was hypoxic with pulse oximetry of 75%. The patient was placed on nonrebreather and he was brought into the ER. Eventually in the ER the patient was placed on BiPAP. He was given nebulizer treatments, IV Solu-Medrol 125 mg, and Zosyn. The patient has underlying dementia and I was unable to get any history from the patient. Wife and son are at bedside. ABG has revealed pH of 7.50 with pO2 of  219 and pCO2 of 46. Lactic acid is at 1.3. As patient was tolerating BiPAP well, patient's FiO2 eventually dropped down to 30%. Hospitalist team is called to admit the patient for acute hypoxic respiratory failure. Patient has underling dementia. Today he seemed to be more confused.  The patient's initial blood sugar had revealed a value of 46. Subsequently, at 2345 patient's Accu-Chek is at 109.   PAST MEDICAL HISTORY:  1.  End-stage renal disease on hemodialysis on Monday, Wednesday, and Friday, last dialysis was done today. 2.   History of previous coronary artery disease.  3.  Obstructive sleep apnea and CPAP at bedtime.  4.  Diabetes mellitus.  5.  Chronic history of COPD.   6.  Hypertension.  7.  Dementia.  8.  History of peripheral vascular disease status post PTCA.   PAST SURGICAL HISTORY:  AV graft placed in the left upper extremity. PTCA for peripheral vascular disease. Lung cancer resection. Partial colon resection.   ALLERGIES: He is allergic to IVP DYE and SHELLFISH.  PSYCHOSOCIAL HISTORY: Currently in resides at [facility], skilled [facility] facility. He used to smoke, but quit smoking several years ago. No history of alcohol according to the son at bedside.   FAMILY HISTORY: Diabetes runs in his family.   HOME MEDICATIONS: Ventolin 2 puffs inhalation every 6 hours as needed,  <<MISSING ENTRY>> 30 mL p.o. once a day after every meal for aspiration precautions, Prevacid 20 mg p.o. at bedtime, sevelamer 1 packet p.o. 3 times a day, nitroglycerin 0.4 mg 1 tablet sublingually every 5 minutes as needed, Nepro 8 ounces orally on Tuesday, Thursday, Saturday, and metoprolol 25 mg 1 tablet p.o. 2 times a day, losartan 50 mg 1 tablet p.o. once a day for hypertension, Levemir 18 units subcutaneously once a day, ciprofloxacin 250 mg p.o. 2 times a day, <<MISSING ENTRY>>   mg 2 tablets p.o. 2 times a day, Tylenol 1 to 2 tablets p.o. every 4 hours as needed for severe pain.    REVIEW OF SYSTEMS: Unobtainable as patient is with altering dementia and with more altered mentation.  PHYSICAL EXAMINATION:  VITAL SIGNS: Temperature 100 degrees Fahrenheit, pulse 70, respirations 16 to 18, blood pressure 142/80, pulse oximetry 100% on BiPAP 30%  with a 30% FiO2.  HEENT: Normocephalic, atraumatic. Pupils are equally reacting to light and accommodation. No scleral icterus. No conjunctival injection. No sinus tenderness. Positive nasal congestion. Patient is on BiPAP. Moist mucous membranes.  NECK: Supple. No JVD. No thyromegaly. Range  of motion is intact.  LUNGS: Coarse bronchial breath sounds with wheezing, rales, and rhonchi, and underlying crackles.  CARDIOVASCULAR: Regular rate and rhythm, tachycardic.  GASTROINTESTINAL: Soft. Bowel sounds are positive in all 4 quadrants. Nontender, nondistended. No masses felt. NEUROLOGIC: Awake and alert, but disoriented. Spontaneously moving all extremities.  EXTREMITIES: Trace edema is present. No cyanosis. No clubbing.  Peripheral pulses are 1+.   SKIN: Warm to touch. Normal turgor. No rashes. No lesions. Right-sided lower extremity on the shin area a wound is present with a clean bandage.  PSYCHIATRIC: Mood and affect could not be elicited.   LABORATORIES AND IMAGING STUDIES:  Portable chest x-ray: Enlarged cardiac silhouette with chronic right basilar atelectasis. Accu-Chek 109. <<MISSING ENTRY>>  at 46. BNP  <<MISSING ENTRY>>,  BUN 10, creatinine 3.24, sodium 136, potassium 3.1, chloride 97, CO2 35. Anion gap is 4. GFR 18. Serum osmolality 68, calcium 8.6. CPK-MB 2.5. Troponin 0.74. WBC 6.8, hemoglobin 9.2, hemoglobin 13,  hematocrit 29.3, platelets are 229, MCV 91, pH 7.50,  pCO2 46, pO2 219, FiO2 50, bicarbonate is 35.9.    12-LEAD EKG: Atrial fibrillation with premature ventricular complexes at 88 beats per minute, normal QRS interval.   ASSESSMENT AND PLAN: A 80 year old male. He was brought into the ER from [facility] for 1-day history of shortness of breath and 2-day history of cough and congestion. The patient is found to be hypoxemic with pulse oximetry of 75% and hypoglycemic with initial blood sugar at 46.  1.  Acute hypoxic respiratory failure, probably from acute exacerbation of chronic obstructive pulmonary disease with possible developing pneumonia. Will admit the patient to telemetry. Continue BiPAP. The patient will be given Solu-Medrol and nebulizer treatments. He will be on IV Zosyn and levofloxacin.  2.  Altered mental status, probably acute encephalopathy from  infectious etiology on chronic dementia. Blood cultures were <<MISSING ENTRY>> and the patient is on IV Zosyn and levofloxacin. Pharmacy to dose antibiotics.  3.  End-stage renal disease on hemodialysis. Nephrology consult is placed to Dr. Cherylann Ratel.  4.  Hypoglycemia is resolved. The patient is currently on Solu-Medrol which will induce hyperglycemia. Will hold off on patient's p.o. medications as the patient is with metabolic encephalopathy and also on BiPAP machine.  5.  History of diabetes mellitus. Initially patient was hypoglycemic, but currently his Accu-Chek is at 109. We will monitor blood sugars closely.  6.  Obstructive sleep apnea. Will provide CPAP at bedtime.  7.  Elevated troponin, probably from end-stage renal disease and demand ischemia. Will monitor cardiac enzymes trend.  8.  He is full code. Plan of care discussed with the patient's wife and son at bedside. They both verbalized understanding of the plan.   Total time spent on admission is 50 minutes.    ____________________________ Ramonita Lab, MD ag:am D: 11/17/2013 00:46:20 ET T: 11/17/2013 01:32:12 ET JOB#: [id number]  cc: Ramonita Lab, MD, <Dictator> Ramonita Lab MD ELECTRONICALLY SIGNED 12/04/2013 7:03

## 2014-12-21 NOTE — H&P (Signed)
PATIENT NAME:  [name], [name] MR#:  [id number] DATE OF BIRTH:  November 17, 1934  DATE OF ADMISSION:  09/03/2013  PRIMARY CARE PHYSICIAN:  Dr. Harrington Challengerhies.  REFERRING PHYSICIAN:  Dr. Shaune PollackLord.  CHIEF COMPLAINT:  Weakness for 2 days.   HISTORY OF PRESENT ILLNESS:  A 80 year old [place] male with multiple medical problems, presented to ED with weakness for 2 days. The patient has a history of ESRD, CAD, hypertension, diabetes. The patient got dialysis 3 days ago, as scheduled on Monday, Wednesday, Friday. The patient feels weak for the past 2 days with mild shortness of breath, but denies any other symptoms; however, the patient reported weight gain and leg edema upon questioning.  The patient was noted to have elevated troponins and high potassium. The patient denies any orthopnea or nocturnal dyspnea.  PAST MEDICAL HISTORY:  ESRD on dialysis, CAD, hypertension, diabetes, lung cancer, status post surgery, colon cancer status post surgery, PVD status post PTCA, sleep apnea on intermittent CPAP, hyperlipidemia, right eye blind.  PAST SURGICAL HISTORY: AV graft placement to the left upper extremity for dialysis, stent and PTCA to the left upper extremity, lung cancer resection, both lobes, and partial colon resection.   SOCIAL HISTORY: Denies any smoking or drinking or illicit drugs. Living with his wife.   FAMILY HISTORY:  Positive for end-stage renal disease, CAD.   REVIEW OF SYSTEMS:  CONSTITUTIONAL:  The patient denies any fever or chills. No headache or dizziness, but has weakness. No weight gain.  EYES: No double vision or blurred vision. Blind right eye. ENT: Denies any postnasal drip, slurred speech or dysphagia.   CARDIOVASCULAR: No chest pain, palpitation, orthopnea or nocturnal dyspnea, but has leg edema.  PULMONARY: Positive for sputum. No cough or hemoptysis. No wheezing.  GASTROINTESTINAL:  No abdominal pain, nausea, vomiting or diarrhea. No melena or bloody stool.  GENITOURINARY: The  patient has no urine. SKIN: No rash or jaundice.  HEMATOLOGY: No easy bruising or bleeding.  ENDOCRINE: No polyuria, polydipsia, heat or cold intolerance.  NEUROLOGY: No syncope, loss of consciousness or seizure.   ALLERGIES: IVP DYE AND  SHELLFISH.   HOME MEDICATIONS: 1.  Sevelamer carbonate 800 mg tablets 3 tablets t.i.d.  2.  Renal Caps.  3.  Vitamin B Complex with C, folic acid oral capsule 1 cap once a day.  4.  Pravastatin 20 mg p.o. at bedtime.  5.  Zofran 4 mg p.o. tablet every 8 hours p.r.n.  6.  Nitroglycerin 0.4 mg p.o. p.r.n. for chest pain.  7.  MiraLAX oral powder 13 grams once a day p.r.n.  8.  Lopressor 25 mg p.o. daily.  9.  Losartan 50 mg p.o. daily.  10.  Lantus 28 units subQ at bedtime.  11.  Humalog sliding scale.  12.   Gabapentin 600 mg p.o. b.i.d. 13.  Colace 100 mg p.o. b.i.d.  14.  Aspirin 81 mg p.o. daily at lunchtime.  15.  Albuterol CFC-free 90 mcg inhalation 2 puffs inhaled 4 times a day p.r.n.  16.  Percocet 325/5 mg p.o. tablets every 4 to 6 hours p.r.n.    PHYSICAL EXAMINATION:  VITAL SIGNS: Temperature 98.2, blood pressure 137/70, pulse 76, O2 saturation 100% on 3 liters oxygen.  GENERAL: The patient is alert, awake, oriented, in no acute distress.  HEENT: Pupils round, about 3 mm and no reaction to light. No discharge from ear or nose. Moist oral mucosa.  Clear oropharynx. NECK: Supple. No JVD or carotid bruit. No lymphadenopathy. No thyromegaly.  CARDIOVASCULAR:  S1, S2 regular rate, rhythm. No murmurs or gallop.  PULMONARY: Bilateral air entry. No wheezing or rales, but has mild crackles on bilateral bases. No use of accessory muscle to breathe.  ABDOMEN: Soft. No distention or tenderness. No organomegaly. Bowel sounds present.  EXTREMITIES: Edema 1+.  No calf tenderness, and no clubbing or cyanosis.  SKIN: No rash or jaundice.  NEUROLOGIC: A and O x 3. No focal deficit. Power 4/5. Sensation intact.   LABORATORY DATA: Troponin 0.07 and then  0.08, and the last one is 0.07 just now. Phosphate 6.7. Chest x-ray showed pulmonary venous congestion without frank pulmonary edema, probably small to moderate right side subpulmonic pleural fluid collection with bibasilar atelectasis.   CK 3717, CK-MB 6.2. Glucose 197, BUN 63, creatinine 10.09, sodium 130, potassium 5.6, chloride 94, bicarb 25, SGOT 145, SGPT 45, albumin 2.7. WBC 10.6, hemoglobin 10.3, platelet 238.   CAT scan of abdomen and pelvis yesterday,  no acute abnormality.   IMPRESSIONS: 1.  Elevated troponin.   2.  Rhabdomyolysis.  3.  Hyperkalemia.  4.  Hyponatremia.  5.  Possible fluid overload.  6.  End-stage renal disease, on dialysis.  7.  Hypertension, diabetes, coronary artery disease, peripheral vascular disease.   PLAN OF TREATMENT: 1.  The patient will be admitted to medical floor with telemonitor. We will increase aspirin to 325 mg p.o. daily and continue statin. We will get an echocardiograph and a cardiology consult. Follow up troponin level.  2.  For hyperkalemia.  Kayexalate was ordered. The patient needs emergent dialysis.  Discussed with Dr. Cherylann Ratel. The patient is being transferred to dialysis center, and follow up BMP tomorrow morning.  3.  We will continue the patient's hypertension medication.  4.  For diabetes, we will start a sliding scale.  5.  We will get a physical therapy evaluation.   I discussed patient's condition and plan of treatment with the patient and the patient's wife.   The patient wants Full Code.   TIME SPENT: About 58 minutes.    ____________________________ Shaune Pollack, MD qc:dmm D: 09/03/2013 13:01:00 ET T: 09/03/2013 13:34:44 ET JOB#: [id number]  cc: Shaune Pollack, MD, <Dictator> Shaune Pollack MD ELECTRONICALLY SIGNED 09/03/2013 16:58

## 2014-12-21 NOTE — Op Note (Signed)
PATIENT NAME:  [name], Shishir L MR#:  [id number] DATE OF BIRTH:  1935/01/27  DATE OF PROCEDURE:  11/20/2013  PREOPERATIVE DIAGNOSIS: Right lateral wet gangrene with necrosis.   POSTOPERATIVE DIAGNOSIS: Right lateral calf we gangrene with necrosis 10 x 6 x 1 cm.   PROCEDURE PERFORMED:  1. Incision and debridement right leg wound down to muscle including fascia, 10 x 6 x 1 cm.  2. Placement of negative wound VAC therapy.  ANESTHESIA: MAC.   ESTIMATED BLOOD LOSS: 15 mL.  COMPLICATIONS: None.   SPECIMENS: None.   INDICATION FOR SURGERY: [name] is a pleasant 80 year old with a history of a right leg ulcer and eschar which appeared to be infected with necrosis. He was brought to the Operating Room suite for debridement of wet gangrene infection with right calf and placement of negative pressure wound VAC therapy. He also placed and placement of negative wound VAC therapy.   DETAILS OF PROCEDURE: Informed consent was obtained. [name] was brought to the Operating Room suite. He was given IV sedation and narcotics. His right leg was prepped and draped in standard surgical fashion. A timeout was then performed correctly identifying the patient name, operative site and procedure to be performed. The eschar over this large wound was removed and all dead tissue was removed down to the muscle using a combination of sharp dissection and electrocautery. Necrotic fascia was removed. The wound was irrigated and made hemostatic. A wound VAC device was then placed over the lateral leg over the wound. The patient was then awakened and brought to the postanesthesia care unit. There were no immediate complications. Needle, sponge, and instrument counts were correct at the end of the procedure.    ____________________________ Si Raiderhristopher A. Chakara Bognar, MD cal:lm D: 11/21/2013 13:47:10 ET T: 11/21/2013 20:37:35 ET JOB#: [id number]  cc: Cristal Deerhristopher A. Delorice Bannister, MD, <Dictator> Jarvis NewcomerHRISTOPHER A Niccole Witthuhn  MD ELECTRONICALLY SIGNED 11/28/2013 12:25

## 2014-12-21 NOTE — Discharge Summary (Signed)
PATIENT NAME:  [name], [name] MR#:  [id number] DATE OF BIRTH:  Aug 21, 1935  DATE OF ADMISSION:  11/16/2013 DATE OF DISCHARGE:  11/22/2013  PRIMARY CARE PHYSICIAN: Dr. Harrington Challenger  FINAL DIAGNOSES: 1.  Acute respiratory failure.  2.  Chronic obstructive pulmonary disease exacerbation and fever.  3.  Hypoglycemia.  4.  End-stage renal disease on hemodialysis Monday, Wednesday and Friday.  5.  Anemia.  6.  Elevated troponin.  7.  Heel decubiti.  8.  Right leg decubiti.   MEDICATIONS ON DISCHARGE: Include pravastatin 20 mg 1 tablet once a day at bedtime, losartan 50 mg 1 tablet daily, Nepro 8 ounces butter pecan if possible in the evening on Tuesday, Thursday, Saturday, and Sunday, Pro-Stat SF 30 mL once a day in the evening, cinacalcet 60 mg once a day, Anusol-HC 25 mg rectal suppository twice a day for hemorrhoids until 3/30, ferrous sulfate 325 mg 1 tablet twice a day for anemia, metoprolol tartrate 25 mg twice a day for hypertension, nitroglycerin 0.4 mg sublingual every 5 minutes as needed for chest pain, sevelamer carbonate 2.4 grams 1 packet 3 times a day, Ventolin HFA 2 puffs every 6 hours as needed for shortness of breath, acetaminophen/oxycodone 325/5 mg 1 tablet every 4 hours as needed for severe pain, Levemir 10 units subcutaneous injection at bedtime, prednisone 5 mg 5 tablets day one, 4 tablets day two, 3 tablets day three, 2 tablets day four, 1 tablet day five and six, Aspart insulin 3 units subcutaneous injection 3 times a day, Augmentin 500 mg orally once a day at bedtime for 4 days, Advair HFA 2 puffs twice a day 115/21 mcg per inhalation.   HOME HEALTH: No treatments. Negative pressure therapy, change every 3 days.   DIET: Low sodium diet, carbohydrate-controlled diet, regular consistency.   ACTIVITY: As tolerated.   DISCHARGE FOLLOWUP: Follow up with Dr. Juliann Pulse, surgery, in 2 weeks, [facility] on Monday, Wednesday, and Friday, and in 1 to 2 days with doctor at rehab.    REASON FOR ADMISSION: The patient was admitted November 16, 2013 and discharged November 22, 2013. The patient was admitted with shortness of breath, found to have acute hypoxic respiratory failure initially requiring BiPAP, hypoglycemia, and also asthmatic bronchitis or COPD exacerbation.   LABORATORY AND RADIOLOGICAL DATA DURING THE HOSPITAL COURSE: Included blood cultures that were negative. EKG showed atrial fibrillation, premature ventricular complexes, anterior septal infarct. BNP 70,431. Glucose 46, BUN 10, creatinine 3.24, sodium 136, potassium 3.1, chloride 97, CO2 35, calcium 8.6. Troponin borderline at 0.74. White blood cell count 6.8, H and H 9.2 and 29.3, platelet count 229,000. Chest x-ray: Enlargement of the cardiac silhouette, right basilar atelectasis. ABG showed a pH of 7.50, pCO2 of 46, pO2 219; that was on 50% oxygen on the BiPAP. Bicarbonate 35.9. LDL 5, HDL 38, triglycerides 46. TSH 1.25. Hemoglobin A1c 6. Repeat chest x-ray negative. Ferritin 1544. Hemoglobin on the 23rd dipped down to 7.9. Hemoglobin 8.5. Hepatitis B surface antigen is negative.   HOSPITAL COURSE PER PROBLEM LIST:  1.  For the patient's acute respiratory failure, initially required BiPAP in the Emergency Room, was tapered down to nasal cannula, now is off oxygen.  2.  COPD exacerbation and fever. The patient had persistent wheeze throughout the entire hospital course, was placed on high-dose steroids, dropped down to medium dose steroids, and now down to prednisone tape upon discharge. The patient was on Levaquin and Zosyn during the hospital course. The patient is afebrile. The patient still  has slight expiratory wheeze upon discharge. Will start Advair Diskus and a prednisone taper and finish up a course of Augmentin. The patient's respiratory status is stable.  3.  Hypoglycemia. Could be the patient's cause of the mental status change also. The patient's sugars are high with the steroids. Will start lower dose Lantus  and low dose Apidra prior to meals. Watch sugars closely. Anytime there is altered mental status check a sugar. Check sugars before meals and at bedtime.  4.  End-stage renal disease, on hemodialysis Monday, Wednesday, and Friday. He was kept on schedule. [facility].  5.  Anemia. He is on ferrous sulfate. We have also been giving Epogen with dialysis. I think with dialysis need to continue to give Epogen 20,000 units with dialysis in order to build up his blood counts. Since his ferritin is high it is not an iron deficiency anemia.  6.  Elevated troponin, likely secondary to the acute respiratory failure. No further cardiac work-up done.  7.  Heel decubiti. Heel protectors while in bed.  8.  Right leg decubiti. Debrided in the operation room by Dr. Juliann PulseLundquist. A wound VAC is placed. Change that every 3 days.  TIME SPENT ON DISCHARGE: 35 minutes.  ____________________________ Herschell Dimesichard J. Renae GlossWieting, MD rjw:sb D: 11/22/2013 10:13:45 ET T: 11/22/2013 10:31:01 ET JOB#: [id number]  cc: Herschell Dimesichard J. Renae GlossWieting, MD, <Dictator> Neomia Dearavid N. Harrington Challengerhies, MD [facility]  Salley ScarletICHARD J Rejina Odle MD ELECTRONICALLY SIGNED 11/23/2013 16:30

## 2014-12-21 NOTE — Discharge Summary (Signed)
PATIENT NAME:  [name], [name]#:  [id number] DATE OF BIRTH:  1934/12/05  DATE OF ADMISSION:  12/24/2013 DATE OF DISCHARGE:  12/26/2013  ADMITTING DIAGNOSIS: Confusion, hypoglycemia, cough.   DISCHARGE DIAGNOSES:  1.  Acute metabolic encephalopathy, hypoglycemia, possibly related to pneumonia, now the patient's mental status is back to baseline.   2.  Hypoglycemia, likely due to poor p.o. intake. The patient currently will not be on insulin. His blood sugars need to be followed without any treatment unless his blood sugars start increasing.  3.  Chronic right lower extremity wound, unchanged.  4.  End-stage renal disease on hemodialysis Monday, Wednesday, Friday.  5.  History of coronary artery disease.  6.  Obstructive sleep apnea, CPAP at all times.  7.  Diabetes.  8.  History of chronic obstructive pulmonary disease without any evidence of acute exacerbation.  9.  Hypertension.  10. Dementia.  11. Peripheral vascular disease, status post percutaneous transluminal coronary angioplasty.  12. Status post arteriovenous graft placement in the left upper extremity.  13. History of lung cancer, status post resection.  14. Status post partial colon resection.   PERTINENT LABS AND EVALUATIONS: Admitting CK-MB 1.8, CPK 43, glucose 49, BUN 22, creatinine 5.74, sodium 136, potassium 3.0, chloride 956, alkaline phosphatase 147, ALT 10 and AST 13. WBC 15.2, hemoglobin 11.2, platelet count 291. Troponin less than 0.02. Chest x-ray shows stable right basilar opacity concerning for pneumonia, subsegmental atelectasis or edema associated with minimal pleural effusion, stable lingular opacities. Most recent WBC count on the 29th, WBC 5.8 and hemoglobin 8.5.   CONSULTANTS: Dr. Juliann PulseLundquist and Dr. Mady HaagensenMunsoor Lateef.   HOSPITAL COURSE: Please refer to H and P done by me on admission. The patient is a 80 year old African American male residing in a nursing home, who was brought in with altered mental status and  hypoglycemia. He was noted to have possible pneumonia and elevated WBC count. The patient initially received an amp of D50 with improvement in his blood sugars. His mental status started improving. He was treated for pneumonia. Now, his WBC count has normalized. He is feeling well and is stable for discharge back to the facility. The patient was noted to be hypoglycemic and his insulin has been stopped. His will need to be followed.   MEDICATIONS AT THE TIME OF DISCHARGE: Pravastatin 20 at bedtime, Cinacalcet 60 mg daily, Anusol-HC 1 suppository rectally b.i.d., iron sulfate 325, 1 tab p.o. b.i.d., metoprolol tartrate 25 mg 1 tab p.o. b.i.d., nitroglycerin 0.4 sublingual p.r.n. chest pain, Ventolin 2 puffs q.6 hours p.r.n., Advair 115/21, 2 puffs b.i.d., Renvela 1 tab p.o. t.i.d., losartan 100 daily, Tylenol 650 q.4 hours p.r.n. for pain, aspirin 81, 1 tab p.o. daily and Levaquin 500, 1 tab p.o. every 48 hours x 5 days.   ACTIVITY: As tolerated with PT evaluation and treatment.   FOLLOWUP: With primary M.D. in 1 to 2 weeks. The patient is to continue to have right lower extremity wound VAC as previously. Follow up with Dr. Juliann PulseLundquist of surgery as scheduled. The patient to have Accu-Cheks q. before meals and at bedtime. His regimen needs to be adjusted per nursing home facility M.D.   TIME SPENT: 35 minutes.  ____________________________ Lacie ScottsShreyang H. Allena KatzPatel, MD shp:aw D: 12/26/2013 11:29:51 ET T: 12/26/2013 11:40:20 ET JOB#: [id number]  cc: Kyndell Zeiser H. Allena KatzPatel, MD, <Dictator> Charise CarwinSHREYANG H Quamir Willemsen MD ELECTRONICALLY SIGNED 12/28/2013 14:05

## 2014-12-21 NOTE — Consult Note (Signed)
PATIENT NAME:  [name], [name] MR#:  [id number] DATE OF BIRTH:  [birth year]  DATE OF CONSULTATION:  10/04/2013  CONSULTING PHYSICIAN:  Linus Galasodd Minha Fulco, DPM  REASON FOR CONSULTATION: This is a 80 year old male recently admitted after an angiogram performed earlier today with some confusion and fever. The patient is admitted for evaluation and treatment of gangrenous toes on his right 4th and 5th. Family states this has been going on for the past couple of months. Has been seen outpatient by my partner, Dr. Ether GriffinsFowler.   PAST MEDICAL HISTORY: End-stage renal disease, hemodialysis on Monday/Wednesday/Friday; heart disease; hypertension; dementia; peripheral vascular disease; sleep apnea.   PAST SURGICAL HISTORY: AV graft, left upper extremity; stent, left upper extremity; lung cancer resection; partial colon resection; angiogram with stenting, right leg.   MEDICATIONS: Aspirin 81 mg daily, sublingual nitroglycerin p.r.n., metoprolol tartrate 25 mg daily, Pravachol 20 mg daily, Renvela 800 mg three tabs t.i.d. with meals, losartan 50 mg daily, albuterol inhaler 2 puffs four times daily, Levemir insulin 25 units at bedtime, MiraLax p.r.n.   ALLERGIES: I do not see any allergies.   FAMILY HISTORY: Heart disease, diabetes.   SOCIAL HISTORY: [facility]. No alcohol or tobacco use.   REVIEW OF SYSTEMS: Some swelling and discoloration in the right foot, the gangrenous toes which do have some drainage. These were not related to any specific any paresthesias or numbness. Denies any fever or chills.   PHYSICAL EXAMINATION:  VASCULAR: DP and PT pulses are palpable 1/4 in the right leg. Also palpable but diminished on the left. Capillary filling time is intact to digits 1, 2 and 3 on the right foot.  NEUROLOGICAL: Grossly intact.  INTEGUMENT: Skin is atrophic with diminished hair growth. Some hyperpigmentations and edema in the right lower extremity. Gangrenous changes to the right 4th and 5th toes. An  ulceration is present along the lateral aspect of the 3rd toe but capillary filling time is intact.  MUSCULOSKELETAL: Deferred.    IMPRESSION:  1.  Gangrene with infection, right 4th and 5th toes.  2.  Peripheral vascular disease.   PLAN: I discussed with the patient and his family members present in the room the need for amputation of the right 4th and 5th toes. We discussed having this done tomorrow. Discussed possible complications including nonhealing, continued infection as well as the need for secondary procedures and/or higher up amputations.   I discussed that with no treatment, he runs a much higher risk of continued infection and losing his leg. Consent form for amputation, right 4th and 5th toes and n.p.o. after midnight. We will plan for surgery tomorrow.   ____________________________ Linus Galasodd Safal Halderman, DPM tc:np D: 10/04/2013 16:22:57 ET T: 10/04/2013 16:48:39 ET JOB#: [id number]  cc: Linus Galasodd Kejuan Bekker, DPM, <Dictator> Annice NeedyJason S. Dew, MD Dejanira Pamintuan DPM ELECTRONICALLY SIGNED 10/23/2013 9:56

## 2014-12-21 NOTE — Consult Note (Signed)
PATIENT NAME:  [name], [name] MR#:  [id number] DATE OF BIRTH:  1934/12/09  DATE OF CONSULTATION:  10/04/2013  REFERRING PHYSICIAN:  Dr. Cherlynn Kaiser.  CONSULTING PHYSICIAN:  Stann Mainland. Sampson Goon, MD  REASON FOR CONSULTATION: Fever.    HISTORY OF PRESENT ILLNESS: This is a 80 year old gentleman with end-stage renal disease as well as peripheral vascular disease on hemodialysis. He was admitted recently from January 5 to January 15 with altered mental status as well as Staph capitis bacteremia, diverticulitis, and gangrene. He was discharged on the 15th to [facility]. He was to continue vancomycin through January 22 for a 2-week course to treat the Staph capitis bacteremia. He was also to finish a course of Cipro and Flagyl for diverticulitis.   The patient is doing relatively well apparently at the skilled nursing facility or nursing home. He went to dialysis yesterday. Today he apparently came for an elective angiogram of his right lower extremity by Dr. Wyn Quaker. He was noted to have a low-grade fever and a hemoglobin of 5.9. He was admitted following the angiogram.   Currently he has advanced dementia so it is difficult to get a history from him. His wife says he has not been complaining of anything but when asked, he does say he has pain. It is difficult to localize. He has had two sets of blood cultures done after and has received a dose of Ancef prior to the procedure.   PAST MEDICAL HISTORY:  1.  End-stage renal disease.  2.  Previous coronary artery disease.  3.  Hypertension.  4.  Dementia.  5.  Peripheral vascular disease.  6.  Sleep apnea.  7.  Recent diverticulitis.  8.  Gangrene.   PAST SURGICAL HISTORY:  1.  AV graft, left upper extremity.  2.  Stent to left upper extremity.  3.  Lung cancer resection.  4.  Partial colon resection.   SOCIAL HISTORY: The patient resides at [facility]. No tobacco, alcohol or drugs.   FAMILY HISTORY: Positive for coronary artery disease and  diabetes.   REVIEW OF SYSTEMS: Unable to be obtained.   MEDICATIONS: Current antibiotics include vancomycin and Zosyn. Other meds include pravastatin, Zofran, calcium acetate, Zantac, and Tylenol.   PHYSICAL EXAMINATION:  VITAL SIGNS: Temperature 98.6. Apparently it was 101 at the procedure. Pulse 74, blood pressure 122/17, pulse oximetry 89% on room air.  GENERAL: Obese, quiet, lying in bed, in no acute distress.  HEENT: Pupils equal, round, reactive to light and accommodation.  OROPHARYNX: Clear.  NECK: Supple.  HEART: Regular.  LUNGS: Clear to auscultation bilaterally.  ABDOMEN: Obese and difficult to examine. He does appear to have some mild tenderness to palpation in his left lower quadrant.  EXTREMITIES: Trace edema bilaterally. His right foot has gangrenous 4th and 5th toes.   LABORATORY, DIAGNOSTIC AND RADIOLOGICAL DATA: Prior labs showed Staph capitis in his blood cultures January 6. Followup January 8 was negative.   CT scan done on January 6th showed some sigmoid colon thickening and inflammation suggestive of diverticulitis.   He did have an echocardiogram January 6th which was negative for diverticulitis.   Blood cultures x2 are pending from today.   White blood count today is 8.0. His urine this past admission had peaked at 19. Hemoglobin is 5.9, platelets 178. Renal function is consistent with end-stage renal disease.   No other imaging is done today.   IMPRESSION: A 80 year old with end-stage renal disease, peripheral vascular disease, gangrene, dementia, who resides in a nursing home  and was here in a recurrent admission for fevers and profound anemia.   1.  I suspect his fevers are due to either recurrent Staphylococcus capitis bacteremia, his gangrenous toes, or recurrent diverticulitis. It is difficult to get a history from him, but he does seem to be somewhat tender in his left lower quadrant.  2.  I agree with blood cultures which have been done, although it does  seem he got a dose of Ancef in the procedure. I am not sure if that was before or after the blood cultures were done.  3.  Continue vancomycin and Zosyn.  4.  Wound care to his toes.  5.  Consider CT of his abdomen given his abdominal pain. Zosyn would cover diverticulitis but we may need to switch him to orals at discharge.   Thank you for the consult. I will be glad to follow with you.   ____________________________ Stann Mainlandavid P. Sampson GoonFitzgerald, MD dpf:np D: 10/04/2013 14:44:58 ET T: 10/04/2013 15:39:54 ET JOB#: [id number]  cc: Stann Mainlandavid P. Sampson GoonFitzgerald, MD, <Dictator> Greenlee Ancheta Sampson GoonFITZGERALD MD ELECTRONICALLY SIGNED 10/07/2013 7:31

## 2014-12-21 NOTE — Consult Note (Signed)
Brief Consult Note: Diagnosis: Diverticulitis.   Patient was seen by consultant.   Consult note dictated.   Discussed with Attending MD.   Comments: Patient seen and examined. Markedly tender LLQ. CT reviewed, diverticulitis changes with some constipation. Agree with antibiotics and bisacodyl suppositories.  Last colonoscopy was 03/2013 by Dr Eber HongM Skulskie, notable for diveritculosis and rectal prolapse. Otherwise negative.  Will discuss with Dr Shelle Ironein and dictate full consult.  Electronic Signatures: Ashok Cordiaarle, Arra Connaughton M (PA-C)  (Signed 07-Jan-15 14:34)  Authored: Brief Consult Note   Last Updated: 07-Jan-15 14:34 by Ashok CordiaEarle, Deanza Upperman M (PA-C)

## 2014-12-21 NOTE — Consult Note (Signed)
Referring Physician:  Demetrios Loll :   Primary Care Physician:  Simonne Maffucci [facility], [address], [place], [place] [address], [place] [phone]  Reason for Consult: Admit Date: 03-Sep-2013  Chief Complaint: weakness  Reason for Consult: weakness   History of Present Illness: History of Present Illness:   A 80 year old [place] American male with multiple medical problems, presented to ED with weakness for 2 days before admission. The patient has a history of ESRD, CAD, hypertension, diabetes. The patient got dialysis per schedule, scheduled on Monday, Wednesday, Friday. The patient feels weak for the past 2 days with mild shortness of breath, but denies any other symptoms; however, the patient reported weight gain and leg edema upon questioning.  The patient was noted to have elevated troponins and high potassium. The patient denies any orthopnea or nocturnal dyspnea.admission noted to have abdominal pain related to diverticulitis per GI, surgery also following, pt also developed significant altered mental status (not following commands) MEDICAL HISTORY:  on dialysiscancerpost surgerycancer status post surgerystatus post PTCAapnea on intermittent CPAPeye blind. SURGICAL HISTORY: graft placement to the left upper extremity for dialysis, and PTCA to the left upper extremity, cancer resection, both lobescolon resection.  HISTORY: any smoking or drinking or illicit drugs. with his wife.  HISTORY:  for end-stage renal disease, CAD.   IVP DYE  MEDICATIONS: Sevelamer carbonate 800 mg tablets 3 tablets t.i.d.  Renal Caps.  Vitamin B Complex with C, folic acid oral capsule 1 cap once a day.  Pravastatin 20 mg p.o. at bedtime.  Zofran 4 mg p.o. tablet every 8 hours p.r.n.  Nitroglycerin 0.4 mg p.o. p.r.n. for chest pain.  MiraLAX oral powder 13 grams once a day p.r.n.  Lopressor 25 mg p.o. daily.  Losartan 50 mg p.o. daily.  Lantus 28 units subQ at bedtime.  Humalog sliding scale.   Gabapentin  600 mg p.o. b.i.d. Colace 100 mg p.o. b.i.d.  Aspirin 81 mg p.o. daily at lunchtime.  Albuterol CFC-free 90 mcg inhalation 2 puffs inhaled 4 times a day p.r.n.  Percocet 325/5 mg p.o. tablets every 4 to 6 hours p.r.n.      ROS:  Review of Systems   ROS not reliable due to AMS  Past Medical/Surgical Hx:  COPD:   PVD:   atherosclerosis:   Diabetes:   GI Bleed:   colon and lung ca:   Anuria:   Dialysis: Fistula in Left Upper Arm  HTN:   MI:   ESRD:   Hemicolectomy:   LUNG SURGERY BILAT WITH LOBE REMOVAL:   PARTIAL COLON REMOVED:   LEFT ARM GRAFT/DIALYSIS SHUNT:   Home Medications: Medication Instructions Last Modified Date/Time  acetaminophen-oxyCODONE 325 mg-5 mg tablet 1-2 tab(s) orally every 4-6 hours- as needed  05-Jan-15 08:38  ondansetron 4 mg oral tablet 1 tab(s) orally every 8 hours, As Needed- for Nausea, Vomiting  05-Jan-15 08:38  Lantus 100 units/mL subcutaneous solution 28 unit(s) subcutaneous once a day (at bedtime) 05-Jan-15 08:38  aspirin 81 mg oral tablet 1 tab(s) orally once a day at lunch time. 05-Jan-15 08:38  Colace sodium 100 mg oral capsule 1 cap(s) orally 2 times a day 05-Jan-15 08:38  losartan 50 mg oral tablet 1 tab(s) orally once a day 05-Jan-15 08:38  Renal Caps Vitamin B Complex with C and Folic Acid oral capsule 1 cap(s) orally once a day 05-Jan-15 08:38  albuterol CFC free 90 mcg/inh inhalation aerosol 2 puff(s) inhaled 4 times a day, As Needed - for Shortness of  Breath  05-Jan-15 08:38  MiraLax oral powder for reconstitution 13 gram(s) orally once a day, As Needed 05-Jan-15 08:38  nitroglycerin 0.4 milligram(s) orally , As Needed- for Chest Pain  05-Jan-15 08:38  Humalog 100 units/mL subcutaneous solution sliding scale subcutaneous , As Needed 05-Jan-15 08:38  metoprolol 25 mg oral tablet 1 tab(s) orally once a day 05-Jan-15 08:38  gabapentin 600 mg oral tablet 1 tab(s) orally 2 times a day 05-Jan-15 08:38  pravastatin 20 mg oral tablet 1 tab(s)  orally once a day (at bedtime) 05-Jan-15 08:38  sevelamer carbonate 800 mg oral tablet 3 tab(s) orally 3 times a day with meals 05-Jan-15 08:38   Allergies:  Shellfish: Swelling, Rash  IVP Dye: Hives  Vital Signs: **Vital Signs.:   12-Jan-15 11:10  Vital Signs Type Routine  Temperature Temperature (F) 99.1  Celsius 37.2  Temperature Source oral  Pulse Pulse 81  Respirations Respirations 20  Systolic BP Systolic BP 578  Diastolic BP (mmHg) Diastolic BP (mmHg) 65  Mean BP 91  Pulse Ox % Pulse Ox % 97  Pulse Ox Activity Level  At rest  Oxygen Delivery 1L   EXAM: Pt seen in dialysis unit General Exam Patient looks appropriate of age, well built, nourished and appropriately groomed, centrally obese.   Cardiovascular Exam: S1, S2 heart sounds present Carotid exam revealed no bruit Lung exam was clear to auscultation belly somewhat tender    Neurological Exam      Mental Status: alert, not oriented to time, place and person, follows one step command but can't follow complex commands, no neglect, hypophonia can't r/o dysarthria.     Attention span and concentration seemed appropriate       Cranial Nerves:      Olfactory and vagus nerves not are examined      Visual fields were full (poor co-oeration) floating opacity in right lense ?, left IOL,       Pupils were equal, round and reactive to light       Extra-ocular movements are normal      Facial sensations are normal      Face is symmetric decreased hearing      Palate and uvular movements are normal and oral sensations are OK      Neck muscle strength and shoulder shrug is normal      Tongue protrusion and uvular elevation are normal       Motor Exam:      Tone is normal in all extremities won't lift up his legs for me. (encephalopaty or leg wekness)       Deep Tendon Reflexes:      symmetric absent, toes mute            Sensory Exam:      sensation decreased (but unreliable exam) can't do rest of neuro exam  reliabllly when pt is being dialyzed + poor MS.  Lab Results:  Thyroid:  06-Jan-15 11:14   Thyroid Stimulating Hormone 1.70 (0.45-4.50 (International Unit)  ----------------------- Pregnant patients have  different reference  ranges for TSH:  - - - - - - - - - -  Pregnant, first trimetser:  0.36 - 2.50 uIU/mL)  LabObservation:  06-Jan-15 07:39   OBSERVATION Reason for Test  Hepatic:  05-Jan-15 06:51   Bilirubin, Total 0.3  Alkaline Phosphatase 100 (45-117 NOTE: New Reference Range 07/20/13)  SGPT (ALT) 45  SGOT (AST)  145  Total Protein, Serum 7.5  Albumin, Serum  2.7  TDMs:  09-Jan-15 13:52  Vancomycin, Trough LAB  26  Routine Micro:  06-Jan-15 11:14   Organism Name STAPHYLOCOCCUS CAPITIS  Clindamycin Sensitivity I  Oxacillin Sensitivity S  Ciprofloxacin Sensitivity I  Gentamicin Sensitivity S  Erythromycin Sensitivity R  Levofloxacin Sensitivity I  Specimen Source right hand/dialysis  Organism 1 STAPHYLOCOCCUS CAPITIS  Culture Comment . ID TO FOLLOW  Gram Stain 1 GRAM POSITIVE COCCI IN CLUSTERS  Culture Comment    . GPC NOTED IN ANAEROBIC BOTTLE  Gram Stain 2 GPC IN ANAEROBIC BOTTLE  08-Jan-15 11:45   Micro Text Report BLOOD CULTURE   COMMENT                   NO GROWTH IN 48 HOURS   ANTIBIOTIC                       Culture Comment NO GROWTH IN 48 HOURS  Result(s) reported on 08 Sep 2013 at 11:00AM.  General Ref:  05-Jan-15 10:26   Hepatitis B Surface Antibody, Qual ========== TEST NAME ==========  ========= RESULTS =========  = REFERENCE RANGE =  HEPATITIS B SURF.AB,QUAL  Hep B Surface Ab Hep B Surface Ab, Qual          [   Reactive             ]                                                Non Reactive: Inconsistent with immunity,                                            less than 10 mIU/mL                              Reactive:     Consistent with immunity,                                            greater than 9.9 mIU/mL                [facility]            No: [id number]           [address], [place], [place] [address]           Lindon Romp, MD         [phone]   Result(s) reported on 04 Sep 2013 at 04:49AM.    11:40   Parathyroid Hormone, Intact ========== TEST NAME ==========  ========= RESULTS =========  = REFERENCE RANGE =  PTH INTACT  PTH, Intact PTH, Intact                     [H  661 pg/mL            ]             15-65               [facility]            No: [id number]  [address], [place], [place] [address]           Lindon Romp, MD         [phone]   Result(s) reported on 04 Sep 2013 at 09:50AM.  06-Jan-15 11:14   Myoglobin, Serum ========== TEST NAME ==========  ========= RESULTS =========  = REFERENCE RANGE =  MYOGLOBIN  Myoglobin, Serum Myoglobin, Serum                [facility]  1878 ng/mL           ]             [address]               [facility]            No: [id number]           [address], [place], [place] [address]           Lindon Romp, MD         [phone]   Result(s) reported on 06 Sep 2013 at 09:19AM.  Aldolase, Serum ========== TEST NAME ==========  ========= RESULTS =========  = REFERENCE RANGE =  ALDOLASE  Aldolase Aldolase                        [H  11.4 U/L             ]          3.3-10.3                              **Please note reference interval [facility]            No: [id number]           [address] [place], [place] [address]           Lindon Romp, MD         [phone]   Result(s) reported on 06 Sep 2013 at 09:19AM.  CRP, High Sensitivity ========== TEST NAME ==========  ========= RESULTS =========  = REFERENCE RANGE =  CRP, HIGH SENSITIVITY  C-Reactive Protein, Cardiac C-Reactive Protein, Cardiac     [H  427.72 mg/L          ]         0.00-3.00 Results confirmed on dilution.                        Relative Risk for Future Cardiovascular  Event                                             Low                 <1.00                                             Average       1.00 - 3.00      High                >3.00  [facility]            No: [id number]           [address] [place], [place] [address]           Lindon Romp, MD         [phone]   Result(s) reported on 06 Sep 2013 at 09:19AM.  07-Jan-15 06:19   ANA Comprehensive Panel ========== TEST NAME ==========  ========= RESULTS =========  = REFERENCE RANGE =  ANA COMPREHENSIVE PANEL  ANA Comprehensive Panel Anti-DNA (DS) Ab Qn             [   <1 IU/mL             ]               0-9               Negative      <5                                                   Equivocal  5 - 9                                                   Positive      >9 RNP Antibodies                  [   <0.2 AI              ]           0.0-0.9 Smith Antibodies                [   <0.2 AI              ]           0.0-0.9 Antiscleroderma-70 Antibodies   [   <0.2 AI              ]           0.0-0.9 Sjogren's Anti-SS-A             [   <0.2 AI              ]           0.0-0.9 Sjogren's Anti-SS-B          [   <0.2 AI              ]           0.0-0.9 Antichromatin Antibodies        [   <0.2 AI              ]           0.0-0.9 Anti-Jo-1                       [   <0.2 AI              ]           0.0-0.9 Anti-Centromere B Antibodies    [   <0.2 AI              ]  0.0-0.9 See below:                      [   Final Report         ]                   Autoantibody                       Disease Association ------------------------------------------------------------         Condition                  Frequency ---------------------   ------------------------   --------- Antinuclear Antibody,    SLE, mixed connective Direct (ANA-D)           tissue diseases ---------------------   ------------------------   --------- dsDNA                    SLE                         40 - 60% ---------------------   ------------------------   --------- Chromatin                Drug induced SLE                90%                          SLE                        48 - 97% ---------------------   ------------------------   --------- SSA (Ro)                 SLE                        25 - 35%                          Sjogren's Syndrome         40 - 70%                          Neonatal Lupus                 100% ---------------------   ------------------------   --------- SSB (La)                 SLE                             10%                          Sjogren's Syndrome              30% ---------------------   -----------------------    --------- Sm (anti-Smith)          SLE                        15 - 30% ---------------------   -----------------------    --------- RNP                      Mixed Connective Tissue  Disease                         95% (U1 nRNP,          SLE                        30 - 50% anti-ribonucleoprotein)  Polymyositis and/or                          Dermatomyositis                 20% ---------------------   ------------------------   --------- Scl-70 (antiDNA          Scleroderma (diffuse)      20 - 35% topoisomerase)           Crest                           13% ---------------------   ------------------------   --------- Jo-1                     Polymyositis and/or                          Dermatomyositis            20 - 40% ---------------------   ------------------------   --------- Centromere B             Scleroderma - Crest                          variant                         80%               [facility]            No: [id number]  [address], [place], [place] [address]           Lindon Romp, MD         [phone]   Result(s) reported on 06 Sep 2013 at 02:49PM.  Routine Chem:  07-Jan-15 15:53   Ammonia, Plasma  50 (Result(s) reported on 05 Sep 2013 at 04:54PM.)   09-Jan-15 13:52   Result Comment VANCOMYCIN - RESULTS VERIFIED BY REPEAT TESTING.  - NOTIFIED OF CRITICAL VALUE  - CALLED JASON ROBBINS 1427 09-07-13 GAS  - READ-BACK PROCESS PERFORMED.  Result(s) reported on 07 Sep 2013 at 02:30PM.  Phosphorus, Serum  6.2 (Result(s) reported on 07 Sep 2013 at 07:16PM.)  10-Jan-15 05:59   Glucose, Serum  216  BUN  32  Creatinine (comp)  3.95  Sodium, Serum 139  Potassium, Serum 4.1  Chloride, Serum  97  CO2, Serum 26  Calcium (Total), Serum  10.4  Anion Gap 16  Osmolality (calc) 291  eGFR (African American)  16  eGFR (Non-African American)  14 (eGFR values <74m/min/1.73 m2 may be an indication of chronic kidney disease (CKD). Calculated eGFR is useful in patients with stable renal function. The eGFR calculation will not be reliable in acutely ill patients when serum creatinine is changing rapidly. It is not useful in  patients on dialysis. The eGFR calculation may not be applicable to patients at the low and high extremes of body sizes, pregnant women, and vegetarians.)  Cardiac:  05-Jan-15 06:51   CPK-MB, Serum  6.2    11:40   Troponin I  0.07 (0.00-0.05 0.05 ng/mL or less: NEGATIVE  Repeat testing in 3-6 hrs  if clinically indicated. >0.05 ng/mL: POTENTIAL  MYOCARDIAL INJURY. Repeat  testing in 3-6 hrs if  clinically indicated. NOTE: An increase or decrease  of 30% or more on serial  testing suggests a  clinically important change)  06-Jan-15 11:14   CK, Total  976 (Result(s) reported on 04 Sep 2013 at 03:32PM.)  Routine Hem:  06-Jan-15 11:14   Erythrocyte Sed Rate  126 (Result(s) reported on 04 Sep 2013 at 03:45PM.)  12-Jan-15 04:12   WBC (CBC) 10.3  RBC (CBC)  3.29  Hemoglobin (CBC)  9.4  Hematocrit (CBC)  28.9  Platelet Count (CBC) 277  MCV 88  MCH 28.6  MCHC 32.6  RDW  17.6  Neutrophil % 72.2  Lymphocyte % 11.9  Monocyte % 12.6  Eosinophil % 2.6  Basophil % 0.7  Neutrophil #  7.5  Lymphocyte # 1.2  Monocyte #   1.3  Eosinophil # 0.3  Basophil # 0.1 (Result(s) reported on 10 Sep 2013 at 05:17AM.)   Radiology Results: CT:    06-Jan-15 10:45, CT Head Without Contrast  CT Head Without Contrast   REASON FOR EXAM:    Altered mental status/Lethargy.  COMMENTS:       PROCEDURE: CT  - CT HEAD WITHOUT CONTRAST  - Sep 04 2013 10:45AM     CLINICAL DATA:  Altered mental status.  Lethargy.    EXAM:  CT HEAD WITHOUT CONTRAST    TECHNIQUE:  Contiguous axial images were obtained from the base of the skull  through the vertex without intravenous contrast.    COMPARISON:  11/10/2012.  FINDINGS:  No intracranial hemorrhage.    Remote lacune infarct right basal ganglia.    Small vessel disease type changes without CT evidence of large acute  infarct.    Vascular calcifications.    Global atrophy without hydrocephalus.    Partially empty sella.    No intracranial mass lesion noted on this unenhanced exam.  Ossification of the falx most notable anteriorly stable in  appearance.     IMPRESSION:  No intracranial hemorrhage or CT evidence of large acute infarct.    Atrophy without hydrocephalus.    Vascular calcifications.      Electronically Signed    By: Chauncey Cruel M.D.    On: 09/04/2013 10:49     Verified By: Doug Sou, M.D.,   Radiology Impression: Radiology Impression: MRI brain - atrophy, old right putamenal lacunar infact vs virchow-robin space, some WM changes - rest OK, no new infract.   Impression/Recommendations: Recommendations:   80 yo male w/ hx of ESRD on HD, DM, HTN, hyperlipdemia, diabetic neuropathy, came into hospital due to LE weakness and difficult walking and noted to be volume overloaded, developed abdominal pain and worsening mental status.  Encephalopathy-  multifactorial, initially thought to be due to infection (which has improved), GI pain, off gabapentin.brain atrophy (poor baseline - unrecognized/under-recognized cognitive impairment at baseline), h/o  uremic encephalopathy  Weakness-  most likely related to encephalopathy doubt myositis, also has diabetic polyneuropathy White matter changes-  stable, not cause of above 4.  Fever-  unknown source - diverticulitis - will follow less frequent basis.  Electronic Signatures: Ray Church (MD)  (Signed 27-Jan-15 11:32)  Authored: REFERRING PHYSICIAN, Primary Care Physician, Consult, History of Present Illness, Review of Systems,  PAST MEDICAL/SURGICAL HISTORY, HOME MEDICATIONS, ALLERGIES, NURSING VITAL SIGNS, Physical Exam-, LAB RESULTS, RADIOLOGY RESULTS, Recommendations   Last Updated: 27-Jan-15 11:32 by Ray Church (MD)

## 2014-12-21 NOTE — Consult Note (Signed)
Details:   - GI follow up.  Appreciated surgical input and ID input and agree with their plans.  I have no addl recommendations at this time other than continuing broad sprectum abx.  Colonoscopy contraindicated at this time.   Electronic Signatures: Dow Adolphein, Matthew (MD)  (Signed 09-Jan-15 17:14)  Authored: Details   Last Updated: 09-Jan-15 17:14 by Dow Adolphein, Matthew (MD)

## 2014-12-21 NOTE — Consult Note (Signed)
Details:   - GI Note.  I agree with Brantley StageKaryn Earle a/p.   I was unable to see Ms Jannifer FranklinBurnice today due to her being off the floor. Will see tomorrow.   It appears this is all diverticulitis, esp with recent colon 03/2013 showing only diverticulosis.   Ok to cont zosyn for now.   Will likely require colon in 6 weeks once diveritculitis has chance to cool to exclude malignancy.   Electronic Signatures: Dow Adolphein, Curley Fayette (MD)  (Signed 07-Jan-15 17:18)  Authored: Details   Last Updated: 07-Jan-15 17:18 by Dow Adolphein, Brendalee Matthies (MD)

## 2014-12-21 NOTE — Consult Note (Signed)
PATIENT NAME:  [name], [name] DATE OF BIRTH:  21-Feb-1935  DATE OF CONSULTATION:  09/05/2013  REFERRING PHYSICIAN:  Vivek J. Cherlynn KaiserSainani, MD CONSULTING PHYSICIAN:  Hardie ShackletonKaryn M. Colin BentonEarle, PA-C  ATTENDING GASTROENTEROLOGIST:  Dow AdolphMatthew Rein, MD  REASON FOR CONSULT:  Diverticulitis, abdominal pain and distension.  HISTORY OF PRESENT ILLNESS:  This is a pleasant 80 year old African American gentleman who initially was admitted for concerns of weakness on September 03, 2013.  Upon further workup, he was found to have an elevated potassium level as well as an elevated troponin.  Most recent labs revealed potassium of 6.2 and actually an elevated sed rate of 126.  His white blood cells are 19.7.  As a result, he did submit a blood culture that did turn out positive for gram-positive cocci in clusters.  He underwent a CT of the abdomen and pelvis on September 02, 2013, that was normal.  When this was repeated on January 6th, it does show signs of diverticulitis just superior to the anastomotic site in his sigmoid colon with a degree of constipation just above that level.  There is no evidence of abscess or free air but, when comparing the 2 CT scans from 2 days apart, there was a new finding of inflammation surrounding that area to suggest diverticulitis.  The patient was initially on erythromycin but since has been switched over to Zosyn.  His maximum temperature thus far has been 100.1 and currently it is 99.6.  The patient does have an altered mental status and his ammonia level was unrevealing.  The patient does have a past medical history significant for colon cancer, status post partial colectomy in the level near his sigmoid colon.  According to the family member sitting at bedside, she does report that at first he was having colonoscopies every 2 years and then their outside physician decided to extend that to every 5 years.  She states that his last colonoscopy was somewhere between 2 and 3 years ago but  cannot give me an exact date.  She also cannot recall the name of the physician who performed this.  The patient has been complaining of left lower quadrant abdominal pain intermittently for the past 4 months according to the family member.  He had been having regular bowel movements up until this past week and now he does seem more on the constipated side.  According to the patient's family members, it has been more pellet-like lately and his last bowel movement was 2 days ago.  There are signs of constipation on CT scan.  Of note, he also has a history of end-stage renal disease for which he gets hemodialysis Monday, Wednesday and Friday.  There have been no unintentional weight changes, no nausea or vomiting.  No black or bloody stools.    PAST MEDICAL HISTORY:  End-stage renal disease, on hemodialysis 3 times a week; coronary artery disease, hypertension, diabetes mellitus, history of lung cancer, status post surgery; history of colon cancer, status post surgery; peripheral vascular disease, status post PTCA; sleep apnea with CPAP, dyslipidemia and right eye blindness.   PAST SURGICAL HISTORY:  AV graft in the left upper extremity, lung cancer resection in bilateral lobes, partial colectomy in his left colon, stent and PTCA placement for peripheral vascular disease.  SOCIAL HISTORY:  The patient denies any alcohol, tobacco or illicit drug use.  FAMILY HISTORY:  Negative for GI malignancy, colon cancer, colon polyps or IBD.  ALLERGIES:  IV DYE AND SHELLFISH.  HOME  MEDICATIONS:  Multivitamin, Zofran, pravastatin, nitroglycerin, MiraLax, Lopressor, losartan, Lantus, Humalog, gabapentin, Colace, aspirin, albuterol and Percocet.    REVIEW OF SYSTEMS:  Difficult to obtain due to the patient's altered mental status.  Much of the above history was obtained from 1 of his family members.    OBJECTIVE  VITAL SIGNS:  Blood pressure 126/54, heart rate 89, respirations 20, temp 99.6 with a T-max of 100.1,  bedside pulse ox is 100%. GENERAL:  This is a pleasant 80 year old gentleman, alert but unable to determine if he is oriented, in mild amount of distress, particularly on exam of his abdomen.   HEAD:  Atraumatic, normocephalic.   NECK:  Supple, no lymphadenopathy noted. HEENT:  Sclerae anicteric, mucous membranes moist.   PULMONARY:  Respirations are even and unlabored.  Clear to auscultation bilateral anterior lung fields.   CARDIAC:  Regular rate and rhythm, S1 and S2 noted.   ABDOMEN:  Soft but moderately distended.  Significant amount of tenderness to palpation is noted in the left lower quadrant, both with deep and light palpation.  Normoactive bowel sounds are noted in all 4 quadrants.  No guarding or rebound.  No masses, hernias or organomegaly appreciated.   PSYCHIATRIC:  The patient's mood and affect do seem to be somewhat altered.  NEUROLOGIC:  The patient does have right eye blindness.   EXTREMITIES:  Negative for lower extremity edema, 2+ pulses noted bilaterally. RECTAL EXAM:  Deferred.    IMAGING:   1.  CT of the abdomen and pelvis on September 02, 2013, was unremarkable.  When this was repeated on January 6th, there was evidence of diverticulitis surrounding just superior to the level of his partial colectomy in the sigmoid region.  No evidence of abscess or free air but the inflammation and inflammatory changes surrounding this area were new compared to the CAT scan 2 days prior.  Just above this area of anastomosis there also appears to be some degree of constipation.  Stricturing at this level cannot be entirely ruled out.   2.  Chest x-ray was obtained on the patient showing pulmonary venous congestion without pulmonary edema.  There was also a small pleural fluid on the right side with bibasilar atelectasis.   3.  CT of the head was obtained on the patient and was negative.    LABORATORY DATA:  White blood cells 19.7, hemoglobin 9.3, hematocrit 28.9, platelets 224.  Sodium 134,  potassium 3.2, BUN 50, creatinine 9.71, glucose 101.  TSH 1.70.  Sed rate 126.  CK 976.  Blood cultures were positive for gram-positive cocci in clusters.    ASSESSMENT: 1.  Altered mental status. 2.  Bacteremia, gram-positive cocci in clusters.   3.  Abnormal CT scan showing constipation and diverticulitis in the area of the sigmoid colon where the patient has a history of a partial colectomy secondary to colon cancer.  There are inflammatory changes but negative for abscess or free air in this area.  Stricturing in the area could not be entirely ruled out.   4.  Left lower quadrant abdominal pain and tenderness on exam with some degree of distension.   5.  Leukocytosis.   6.  Fever.   7.  Hyperkalemia.   8.  History of end-stage renal disease, on hemodialysis 3 times weekly.   9.  History of colon cancer, status post partial colectomy.  10.  History of lung cancer, status post surgery on bilateral lobes.    PLAN:  I have discussed this the  patient's case in detail with Dr. Shelle Iron, who was involved in the development of the patient's plan of care.  At this time, based on the CT scan findings, we do agree with the patient being maintained on antibiotics as this does appear to be some degree of diverticulitis, particularly because of the new finding of inflammatory changes on his second CT scan compared to the first.  The patient is bacteremic with gram-positive cocci which actually is not a typical bacteria to find in the digestive tract.  Additionally, he has leukocytosis and a fever and, therefore, we do recommend continuing broad-spectrum antibiotic including Zosyn for further management.  Correct his electrolytes.  We would recommend that the patient undergo a colonoscopy in about 6 to 8 weeks' time once this acute flare-up has settled so we can evaluate the area, document healing and rule out any stricturing or abscess formation.  Also, with the patient's history of colon cancer, we can certainly  evaluate for any signs of polyp or neoplasm at that time as well.  We will continue to monitor this patient closely throughout hospitalization and make further recommendations pending above and per clinical course.    The above plan was explained to the family members at bedside and all questions were answered.  Thank you so much for this consultation and for allowing [place] to participate in this patient's plan of care.    ATTENDING GASTROENTEROLOGIST:  Dow Adolph, MD  ____________________________ Hardie Shackleton. Renji Berwick, PA-C kme:cs D: 09/05/2013 14:13:00 ET T: 09/05/2013 15:41:28 ET JOB#: [id number]  cc: Hardie Shackleton. Jaylenn Altier, PA-C, <Dictator> Hardie Shackleton Ryder Chesmore PA ELECTRONICALLY SIGNED 09/11/2013 16:12

## 2014-12-21 NOTE — Discharge Summary (Signed)
PATIENT NAME:  [name], [name] DATE OF BIRTH:  05/07/1935  DATE OF ADMISSION:  10/04/2013 DATE OF DISCHARGE:  10/08/2013    ADMISSION DIAGNOSIS: Fever.   DISCHARGE DIAGNOSES: 1.  Fever secondary to gangrenous toes.  2.  Tingling of his toes on the right foot, fourth and fifth digits, status post amputation.  3.  End-stage renal disease, on hemodialysis.  4.  Dementia.  5.  Anemia, acute on chronic.  6.  Hypertension.  7.  Hyperlipidemia. 8.  Secondary hyperparathyroidism.   CONSULTATIONS: 1.  ID. 2.  Dr. Alberteen Spindleline from podiatry.   PROCEDURES: The patient underwent amputation on 10/05/2013 with amputation of his right fourth and fifth digits.   Cultures are negative so far.   HOSPITAL COURSE: This is a 80 year old male with a history of end-stage renal disease, on hemodialysis. He has a history of recent Staph capitis bacteremia, who presented with metabolic encephalopathy and fever and low hemoglobin. For further details, please refer to the H and P.  1.  Fever of unknown origin, likely secondary to gangrenous toes. He is status post amputation on 10/05/2013 by Dr. Alberteen Spindleline. His blood cultures have been negative to date. He was treated recently for Staph capitis infection and had been on  vancomycin and was also on Zosyn here for his gangrenous toes. ID was consulted. They recommended vancomycin until his foot has recovered, as well as changing his Zosyn to ciprofloxacin, which he is now on. His first blood culture did show Strep viridans, which is likely a contaminant. Repeat blood cultures have been  negative to date. Dr. Alberteen Spindleline did redress the wound today and recommended saline gauze to the amputation site daily and keeping dressing dry.  2.  Acute on chronic anemia from end-stage renal disease and chronic disease. The patient is status post 3 units of PRBCs with hemoglobin being stable. He will need Neupogen during hemodialysis.  3.  End-stage renal disease, on hemodialysis.  The patient was continued on his dialysis here. 4.  Altered mental status with encephalopathy due to his infectious etiology, complicated by underlying dementia,  is improved and likely at baseline.  5.  Hypertension. The patient will continue on his outpatient medications.  6.  Hyperlipidemia. The patient will continue on Pravachol.   DISCHARGE MEDICATIONS: 1.  Aspirin 81 mg daily.  2.  Nitroglycerin sublingual p.r.n. chest pain.  3.  Metoprolol 25 mg daily. 4.  Pravastatin 20 mg at bedtime.  5.  Polyethylene glycol 17 grams as needed for constipation.  6.  PhosLo gelcap 667 mg 3 tablets t.i.d.  7.  Cozaar 50 mg daily.  8.  Ventolin HFA 2 puffs 4 times a day.  9.  Vancomycin 1000 mg on hemodialysis days.  10.  Ciprofloxacin 250 mg b.i.d. until foot has healed.   DISCHARGE DRESSING: Keep dressing dry. Sterile saline gauze packing to amputation site daily. Keep dry.   DISCHARGE OXYGEN: None.  DISCHARGE DIET: Renal diet.   DISCHARGE ACTIVITY: As tolerated.   DISCHARGE FOLLOWUP: The patient will follow up with Dr. Alberteen Spindleline in 2 weeks. The patient is to have vancomycin in his hemodialysis until his foot has healed.  TIME SPENT: Approximately 40 minutes. The patient is medically stable for discharge.   ____________________________ [name] P. [name] PinaMody, MD spm:jcm D: 10/08/2013 14:21:00 ET T: 10/08/2013 14:57:46 ET JOB#: [id number]  cc: Carinne Brandenburger P. [name] PinaMody, MD, <Dictator> Linus Galasodd Cline, [place] DakotaDPM Zia Kanner P Markeia Harkless MD ELECTRONICALLY SIGNED 10/08/2013 19:36

## 2014-12-22 NOTE — Op Note (Signed)
PATIENT NAME:  [name], [name] MR#:  [id number] DATE OF BIRTH:  1934/12/01  DATE OF PROCEDURE:  11/16/2011  PREOPERATIVE DIAGNOSES:  1. Complication AV dialysis device left brachial axillary dialysis graft.  2. End-stage renal disease requiring hemodialysis.   POSTOPERATIVE DIAGNOSES:  1. Complication AV dialysis device left brachial axillary dialysis graft.  2. End-stage renal disease requiring hemodialysis.   PROCEDURES PERFORMED:  1. Contrast injection of left arm brachial axillary dialysis graft.  2. Percutaneous transluminal angioplasty of the venous outflow, left arm brachial axillary dialysis graft.   SURGEON: Levora DredgeGregory Schnier, M.D.   SEDATION: Versed 2 mg plus fentanyl 50 mcg administered IV. Continuous ECG, pulse oximetry and cardiopulmonary monitoring was performed throughout the entire procedure by the interventional radiology nurse. Total sedation time was 45 minutes.   ACCESS: 6 French sheath, antegrade direction, left arm brachial axillary dialysis graft.   CONTRAST USED: Isovue 20 mL.   FLUOROSCOPY TIME: 1.6 minutes.   INDICATIONS: [name] is a 80 year old gentleman who is referred from dialysis for problems with flow and decreasing efficiency of his dialysis runs. Risks and benefits were reviewed, all questions answered, and the patient has agreed to proceed with contrast injection and possible intervention.   DESCRIPTION OF PROCEDURE: The patient is taken to special procedures and placed in the supine position. After adequate sedation is achieved, the left arm is extended palm upward, prepped and draped in sterile fashion. 1% lidocaine is infiltrated in the soft tissues overlying the palpable graft near the arterial anastomosis and a micropuncture needle is inserted in an antegrade direction, microwire followed by microsheath, J-wire followed by a 6 French sheath. Hand injection of contrast is used to demonstrate the access as well as the central venous anatomy. After  review of the images, there is an area of narrowing in the venous outflow. 3000 units of heparin is given. Wire is advanced across the lesion and initially an 8 x 4 and subsequently a 9 x 4 balloon is used to angioplasty this region. With the balloon inflated, reflux of contrast is utilized to demonstrate the arterial anastomosis. Follow-up angiography demonstrates significant improvement in this area. Balloon and wire are removed. A pursestring suture of 4-0 Monocryl is placed around the sheath and the sheath is removed. There are no immediate complications.   INTERPRETATION: Initial views demonstrate the brachial axillary graft. Multiple stents have been placed. However, there does not appear to be any significant restenosis at the locations where he is accessed. At the venous outflow, there is an approximately 60 to 70% narrowing. The central veins are widely patent. The arterial portion is patent. Following angioplasty, first to 8 and then 9 mm, there is now significant improvement in the area of narrowing, in the venous outflow, with less than 20% residual stenosis.   SUMMARY: Successful salvage of left arm brachial axillary dialysis graft.  ____________________________ Renford DillsGregory G. Schnier, MD ggs:slb D: 11/16/2011 09:12:57 ET T: 11/16/2011 11:51:27 ET JOB#: [id number]  cc: Renford DillsGregory G. Schnier, MD, <Dictator> Renford DillsGREGORY G SCHNIER MD ELECTRONICALLY SIGNED 11/16/2011 12:23

## 2014-12-22 NOTE — Op Note (Signed)
PATIENT NAME:  Glennon, Saxon L MR#:  [id number] DATE OF BIRTH:  [id number]  PREOPERATIVE DIAGNOSIS: Visually significant cataract of the left eye.   POSTOPERATIVE DIAGNOSIS: Visually significant cataract of the left eye.   OPERATIVE PROCEDURE: Cataract extraction by phacoemulsification with implant of intraocular lens to left eye.   SURGEON: Galen ManilaWilliam Carolynn Tuley, MD.   ANESTHESIA:  1. Managed anesthesia care.  2. Topical tetracaine drops followed by 2% Xylocaine jelly applied in the preoperative holding area.   COMPLICATIONS: None.   TECHNIQUE: Stop and chop.   DESCRIPTION OF PROCEDURE: The patient was examined and consented in the preoperative holding area where the aforementioned topical anesthesia was applied to the left eye and then brought back to the Operating Room where the left eye was prepped and draped in the usual sterile ophthalmic fashion and a lid speculum was placed. A paracentesis was created with the side port blade and the anterior chamber was filled with viscoelastic. A near clear corneal incision was performed with the steel keratome. A continuous curvilinear capsulorrhexis was performed with a cystotome followed by the capsulorrhexis forceps. Hydrodissection and hydrodelineation were carried out with BSS on a blunt cannula. The lens was removed in a stop and chop technique and the remaining cortical material was removed with the irrigation-aspiration handpiece. The capsular bag was inflated with viscoelastic and the Technis ZCBOO 19.5-diopter lens, serial number [id number], was placed in the capsular bag without complication. The remaining viscoelastic was removed from the eye with the irrigation-aspiration handpiece. The wounds were hydrated. The anterior chamber was flushed with Miostat and the eye was inflated to physiologic pressure. The wounds were found to be water tight. The eye was dressed with Vigamox. The patient was given protective glasses to wear    throughout the  day and a shield with which to sleep tonight. The patient was also given drops with which to begin a drop regimen today and will follow-up with me in one day.    ____________________________ Jerilee FieldWilliam L. Bear Osten, MD wlp:cbb D: 02/29/2012 12:30:37 ET T: 02/29/2012 12:58:20 ET JOB#: [id number]  cc: Kamarius Buckbee L. Lumen Brinlee, MD, <Dictator> Jerilee FieldWILLIAM L Cecillia Menees MD ELECTRONICALLY SIGNED 03/08/2012 9:58

## 2014-12-29 NOTE — Op Note (Signed)
PATIENT NAME:  [name], [name] MR#:  [id number] DATE OF BIRTH:  [birth year]  DATE OF PROCEDURE:  12/05/2014  PREOPERATIVE DIAGNOSES: 1.  End-stage renal disease.  2.  Poorly functioning left arm arteriovenous graft with diminished flows.  3.  Multiple stenoses within previously placed stents within graft.  4.  Peripheral arterial disease status post multiple previous revascularizations.  5.  Diabetes.   POSTOPERATIVE DIAGNOSES:  1.  End-stage renal disease.  2.  Poorly functioning left arm arteriovenous graft with diminished flows.  3.  Multiple stenoses within previously placed stents within graft.  4.  Peripheral arterial disease status post multiple previous revascularizations.  5.  Diabetes.   PROCEDURES: 1.  Ultrasound guidance for vascular access to left arteriovenous graft. 2.  Left upper extremity shuntogram and central venogram.  3.  Percutaneous transluminal angioplasty of arterial access site with 7 mm diameter drug-coated and 8 mm diameter high-pressure angioplasty balloon.  4.  Percutaneous transluminal angioplasty of venous access site and distal graft with 8 mm diameter high-pressure balloon.   SURGEON: Annice Needy, M.D.   ANESTHESIA: Local with moderate conscious sedation.   ESTIMATED BLOOD LOSS: Minimal.   FLUOROSCOPY TIME: 3 minutes.  CONTRAST USED: 45 mL.   INDICATION FOR PROCEDURE: This is a gentleman well known to [place] for his dialysis access needs. He returns due to poor flows on dialysis. He has had multiple previous interventions and shuntogram is performed for further evaluation and potential treatment. Risks and benefits are discussed. Informed consent was obtained.   DESCRIPTION OF PROCEDURE: The patient is brought to the vascular suite. Left upper extremity was sterilely prepped and draped and a sterile surgical field was created. The graft was accessed as close to the arterial anastomosis as possible under direct ultrasound guidance with a micropuncture  needle. A micropuncture wire and sheath were placed, and we upsized to a 6-French sheath. The patient was given 3000 units of intravenous heparin. Imaging showed the arterial access site to have a high-grade stenosis, in the 80% to 85% range, in what appeared to be likely the venous access site and transitioning down closer to the venous anastomotic area. There was moderate stenosis in the 60% range. The venous outflow in the chest was normal, but there were stents near the axillary vein. I crossed the lesions without difficulty with a Magic torque wire. Initially I treated the arterial access site with a 7 mm diameter drug-coated angioplasty balloon. However, the balloon burst with inflation at 10 atmospheres. The balloon was then deflated. I upsized to an 8 mm diameter high-pressure angioplasty balloon and inflated at the arterial access site. I also used the 8 mm diameter high-pressure angioplasty balloon for the separate and distinct lesion at the venous access site and the distal graft with inflation taken up to about 20 atmospheres at this location. Completion angiogram following this showed markedly improved flow. There was about a 10% to 15% residual stenosis in the distal graft and about a 20% residual stenosis at the arterial access site neither of which were flow-limiting. It should be noted that the distal brachial artery beyond the graft appeared to be occluded when imaging was performed with the balloon inflated. This was chronic and he was not complaining of steal symptoms so we did not address this. I elected to terminate the procedure. The sheath was removed, 4-0 Monocryl pursestring suture was placed, pressure was held and sterile dressing was placed. The patient tolerated the procedure well and was taken to the recovery  room in stable condition.   ____________________________ Annice NeedyJason S. Eshaal Duby, MD jsd:sb D: 12/05/2014 11:45:08 ET T: 12/05/2014 14:02:19 ET JOB#: [id number]  cc: Annice NeedyJason S. Mikki Ziff, MD,  <Dictator> Annice NeedyJASON S Suresh Audi MD ELECTRONICALLY SIGNED 12/19/2014 12:48

## 2015-01-15 ENCOUNTER — Emergency Department
Admission: EM | Admit: 2015-01-15 | Discharge: 2015-01-15 | Disposition: A | Discharge status: Home / Self Care | Payer: Medicare Other | Attending: Emergency Medicine | Admitting: Emergency Medicine

## 2015-01-15 ENCOUNTER — Encounter: Payer: Self-pay | Admitting: Emergency Medicine

## 2015-01-15 ENCOUNTER — Other Ambulatory Visit: Payer: Self-pay

## 2015-01-15 ENCOUNTER — Emergency Department: Payer: Medicare Other

## 2015-01-15 DIAGNOSIS — R109 Unspecified abdominal pain: Secondary | ICD-10-CM | POA: Diagnosis present

## 2015-01-15 DIAGNOSIS — K529 Noninfective gastroenteritis and colitis, unspecified: Secondary | ICD-10-CM

## 2015-01-15 DIAGNOSIS — Z7982 Long term (current) use of aspirin: Secondary | ICD-10-CM | POA: Diagnosis not present

## 2015-01-15 DIAGNOSIS — Z79899 Other long term (current) drug therapy: Secondary | ICD-10-CM | POA: Diagnosis not present

## 2015-01-15 DIAGNOSIS — Z992 Dependence on renal dialysis: Secondary | ICD-10-CM | POA: Diagnosis not present

## 2015-01-15 DIAGNOSIS — E119 Type 2 diabetes mellitus without complications: Secondary | ICD-10-CM | POA: Diagnosis not present

## 2015-01-15 DIAGNOSIS — I120 Hypertensive chronic kidney disease with stage 5 chronic kidney disease or end stage renal disease: Secondary | ICD-10-CM | POA: Insufficient documentation

## 2015-01-15 DIAGNOSIS — N186 End stage renal disease: Secondary | ICD-10-CM | POA: Insufficient documentation

## 2015-01-15 LAB — CBC WITH DIFFERENTIAL/PLATELET
BASOS ABS: 0.1 10*3/uL (ref 0–0.1)
Basophils Relative: 1 %
Eosinophils Absolute: 0.3 10*3/uL (ref 0–0.7)
Eosinophils Relative: 2 %
HEMATOCRIT: 35.4 % — AB (ref 40.0–52.0)
Hemoglobin: 11.6 g/dL — ABNORMAL LOW (ref 13.0–18.0)
LYMPHS PCT: 15 %
Lymphs Abs: 1.9 10*3/uL (ref 1.0–3.6)
MCH: 29.1 pg (ref 26.0–34.0)
MCHC: 32.9 g/dL (ref 32.0–36.0)
MCV: 88.5 fL (ref 80.0–100.0)
MONO ABS: 1.3 10*3/uL — AB (ref 0.2–1.0)
Monocytes Relative: 10 %
Neutro Abs: 9.6 10*3/uL — ABNORMAL HIGH (ref 1.4–6.5)
Neutrophils Relative %: 72 %
Platelets: 169 10*3/uL (ref 150–440)
RBC: 4 MIL/uL — AB (ref 4.40–5.90)
RDW: 17.7 % — ABNORMAL HIGH (ref 11.5–14.5)
WBC: 13.1 10*3/uL — AB (ref 3.8–10.6)

## 2015-01-15 LAB — COMPREHENSIVE METABOLIC PANEL
ALT: 13 U/L — ABNORMAL LOW (ref 17–63)
ANION GAP: 12 (ref 5–15)
AST: 18 U/L (ref 15–41)
Albumin: 3.3 g/dL — ABNORMAL LOW (ref 3.5–5.0)
Alkaline Phosphatase: 184 U/L — ABNORMAL HIGH (ref 38–126)
BUN: 37 mg/dL — ABNORMAL HIGH (ref 6–20)
CO2: 33 mmol/L — ABNORMAL HIGH (ref 22–32)
CREATININE: 6.62 mg/dL — AB (ref 0.61–1.24)
Calcium: 8.1 mg/dL — ABNORMAL LOW (ref 8.9–10.3)
Chloride: 89 mmol/L — ABNORMAL LOW (ref 101–111)
GFR calc Af Amer: 8 mL/min — ABNORMAL LOW (ref >60)
GFR calc non Af Amer: 7 mL/min — ABNORMAL LOW (ref >60)
Glucose, Bld: 128 mg/dL — ABNORMAL HIGH (ref 65–99)
POTASSIUM: 4.6 mmol/L (ref 3.5–5.1)
Sodium: 134 mmol/L — ABNORMAL LOW (ref 135–145)
TOTAL PROTEIN: 7.5 g/dL (ref 6.5–8.1)
Total Bilirubin: 0.4 mg/dL (ref 0.3–1.2)

## 2015-01-15 LAB — LIPASE, BLOOD: Lipase: 24 U/L (ref 22–51)

## 2015-01-15 LAB — TROPONIN I

## 2015-01-15 MED ORDER — OXYCODONE-ACETAMINOPHEN 5-325 MG PO TABS
ORAL_TABLET | ORAL | Status: AC
  Filled 2015-01-15: qty 2

## 2015-01-15 MED ORDER — METRONIDAZOLE 500 MG PO TABS: 500.0000 mg | ORAL_TABLET | Freq: Three times a day (TID) | ORAL | Status: DC

## 2015-01-15 MED ORDER — CIPROFLOXACIN HCL 500 MG PO TABS: 500.0000 mg | ORAL_TABLET | Freq: Two times a day (BID) | ORAL | Status: DC

## 2015-01-15 MED ORDER — OXYCODONE-ACETAMINOPHEN 5-325 MG PO TABS
2.0000 | ORAL_TABLET | Freq: Once | ORAL | Status: AC
  Administered 2015-01-15: 2 via ORAL

## 2015-01-15 NOTE — Discharge Instructions (Signed)

## 2015-01-15 NOTE — ED Notes (Signed)
Pt picked up from a dialysis appointment- he was only able to handle 40 min of dialysis before his abdominal pin increased. His abdominal pain began last night [facility]. His pain remained 10/10. His abdominal pain is in his umbilical region and spreads to the right side.

## 2015-01-15 NOTE — ED Provider Notes (Signed)
[facility] Emergency Department Provider Note     Time seen: 1420 p.m.  I have reviewed the triage vital signs and the nursing notes.   HISTORY  Chief Complaint Abdominal Pain    HPI [name] is a 80 y.o. male who presents for acute onset abdominal pain was at dialysis. Patient did have the pain yesterday, but the pain recurred while in dialysis today. He only was able to undergo 40 minutes of dialysis for the abdominal pain increased. Describes a sharp can attend the right side also describes shortness of breath. He states pain started on the left and spread to the right. Patient is currently states pain is mild.    Past Medical History  Diagnosis Date  . Acute MI, lateral wall, initial episode of care   . ESRD (end stage renal disease)     on HD M/W/F  . Diabetes   . Non-ST elevation myocardial infarction (NSTEMI), initial care episode 06/25/2014    Mild LAD disease, circumflex 90%-->0% with  2.75 x 16 Promus DES, RCA subtotally occluded with left-right collaterals, EF 45-50% by echo    . HTN (hypertension)   . PVD (peripheral vascular disease)     Patient Active Problem List   Diagnosis Date Noted  . NSTEMI (non-ST elevated myocardial infarction) 06/25/2014  . Acute MI, lateral wall, initial episode of care   . ESRD (end stage renal disease)     Past Surgical History  Procedure Laterality Date  . R leg surgery    . Cardiac catheterization  06/25/2014    NSTEMI s/p DES to LCx, subtotally occluded prox RCA  . Left heart catheterization with coronary angiogram N/A 06/25/2014    Procedure: LEFT HEART CATHETERIZATION WITH CORONARY ANGIOGRAM;  Surgeon: Corky Crafts, MD;  Location: [facility] CATH LAB;  Service: Cardiovascular;  Laterality: N/A;  . Percutaneous coronary stent intervention (pci-s)  06/25/2014    Procedure: PERCUTANEOUS CORONARY STENT INTERVENTION (PCI-S);  Surgeon: Corky Crafts, MD;  Location: [facility] CATH LAB;  Service:  Cardiovascular;;    Current Outpatient Rx  Name  Route  Sig  Dispense  Refill  . acetaminophen (TYLENOL) 650 MG CR tablet   Oral   Take 650 mg by mouth every 4 (four) hours as needed for pain or fever.         Marland Kitchen albuterol (PROVENTIL HFA;VENTOLIN HFA) 108 (90 BASE) MCG/ACT inhaler   Inhalation   Inhale 2 puffs into the lungs every 6 (six) hours as needed for wheezing or shortness of breath.         . Amino Acids-Protein Hydrolys (FEEDING SUPPLEMENT, PRO-STAT SUGAR FREE 64,) LIQD   Oral   Take 30 mLs by mouth daily.         Marland Kitchen amLODipine (NORVASC) 2.5 MG tablet   Oral   Take 2.5 mg by mouth daily.         Marland Kitchen aspirin EC 81 MG tablet   Oral   Take 81 mg by mouth daily.         Marland Kitchen b complex-vitamin c-folic acid (NEPHRO-VITE) 0.8 MG TABS tablet   Oral   Take 1 tablet by mouth at bedtime.         . cinacalcet (SENSIPAR) 60 MG tablet   Oral   Take 60 mg by mouth daily.         . clopidogrel (PLAVIX) 75 MG tablet   Oral   Take 75 mg by mouth daily.         Marland Kitchen  ferrous sulfate 325 (65 FE) MG tablet   Oral   Take 325 mg by mouth 2 (two) times daily with a meal.         . glimepiride (AMARYL) 4 MG tablet   Oral   Take 4 mg by mouth daily with breakfast.         . insulin glargine (LANTUS) 100 UNIT/ML injection   Subcutaneous   Inject 11 Units into the skin every morning.         Marland Kitchen. losartan (COZAAR) 25 MG tablet   Oral   Take 1 tablet (25 mg total) by mouth daily.   30 tablet   11   . metoprolol (LOPRESSOR) 50 MG tablet   Oral   Take 50 mg by mouth 2 (two) times daily.         . nitroGLYCERIN (NITROSTAT) 0.6 MG SL tablet   Sublingual   Place 0.6 mg under the tongue every 5 (five) minutes as needed for chest pain.         . Nutritional Supplements (NEPRO) LIQD   Oral   Take 8 oz by mouth 2 (two) times daily.         Marland Kitchen. oxyCODONE (OXY IR/ROXICODONE) 5 MG immediate release tablet   Oral   Take 5 mg by mouth every 6 (six) hours as needed  (pain).         . pravastatin (PRAVACHOL) 20 MG tablet   Oral   Take 20 mg by mouth at bedtime.            Allergies Contrast media and Fish allergy  Family History  Problem Relation Age of Onset  . Bone cancer Father   . Hypertension Father   . Diabetes Mother   . Blindness Mother     Social History History  Substance Use Topics  . Smoking status: Former Smoker    Quit date: 08/30/1956  . Smokeless tobacco: Not on file     Comment: quit 57 yrs ago  . Alcohol Use: 0.0 oz/week    0 Standard drinks or equivalent per week     Comment: occasionally    Review of Systems Constitutional: Negative for fever. Eyes: Negative for visual changes. ENT: Negative for sore throat. Cardiovascular: Negative for chest pain. Respiratory: Positive shortness of breath Gastrointestinal: Positive for abdominal pain, negative for vomiting and diarrhea. Genitourinary: Negative for dysuria. Musculoskeletal: Negative for back pain. Skin: Negative for rash. Neurological: Negative for headaches, focal weakness or numbness.  10-point ROS otherwise negative.  ____________________________________________   PHYSICAL EXAM:  VITAL SIGNS: ED Triage Vitals  Enc Vitals Group     BP 01/15/15 1417 115/57 mmHg     Pulse Rate 01/15/15 1417 66     Resp 01/15/15 1417 20     Temp --      Temp src --      SpO2 --      Weight 01/15/15 1417 208 lb (94.348 kg)     Height 01/15/15 1417 5\' 9"  (1.753 m)     Head Cir --      Peak Flow --      Pain Score 01/15/15 1418 10     Pain Loc --      Pain Edu? --      Excl. in GC? --     Constitutional: Alert and oriented. Well appearing and in no distress. Eyes: Conjunctivae are normal. PERRL. Normal extraocular movements. ENT   Head: Normocephalic and atraumatic.   Nose: No  congestion/rhinnorhea.   Mouth/Throat: Mucous membranes are moist.   Neck: No stridor. Hematological/Lymphatic/Immunilogical: No cervical  lymphadenopathy. Cardiovascular: Normal rate, regular rhythm. Normal and symmetric distal pulses are present in all extremities. No murmurs, rubs, or gallops. Respiratory: Normal respiratory effort without tachypnea nor retractions. Breath sounds are clear and equal bilaterally. No wheezes/rales/rhonchi. Gastrointestinal: Soft but tender right abdomen. Appears to be diffusely located on the right side upper and lower quadrants. Musculoskeletal: Nontender with normal range of motion in all extremities. No joint effusions.  No lower extremity tenderness nor edema. Neurologic:  Normal speech and language. No gross focal neurologic deficits are appreciated. Speech is normal. No gait instability. Skin:  Skin is warm, dry and intact. No rash noted. Psychiatric: Mood and affect are normal. Speech and behavior are normal. Patient exhibits appropriate insight and judgment.  ____________________________________________ EKG: Normal sinus rhythm first degree AV block septal infarct, left axis deviation. No evidence of acute infarction. Rate is 67   LABS (pertinent positives/negatives)  Labs Reviewed - No data to display  ____________________________________________  ED COURSE:  Pertinent labs & imaging results that were available during my care of the patient were reviewed by me and considered in my medical decision making (see chart for details).  Patient is very tender on examination, will check labs and likely CT scan. ____________________________________________   RADIOLOGY  CT abdomen and pelvis is pending  ____________________________________________    FINAL ASSESSMENT AND PLAN  Abdominal pain  Plan: Patient checked out to Dr. Scotty CourtStafford for final disposition.  Emily FilbertWilliams, Abriella Filkins E, MD   Emily FilbertJonathan E Osama Coleson, MD 01/15/15 [phone]

## 2015-01-15 NOTE — ED Provider Notes (Signed)
-----------------------------------------   5:53 PM on 01/15/2015 -----------------------------------------  Labs Reviewed  CBC WITH DIFFERENTIAL/PLATELET - Abnormal; Notable for the following:    WBC 13.1 (*)    RBC 4.00 (*)    Hemoglobin 11.6 (*)    HCT 35.4 (*)    RDW 17.7 (*)    Neutro Abs 9.6 (*)    Monocytes Absolute 1.3 (*)    All other components within normal limits  COMPREHENSIVE METABOLIC PANEL - Abnormal; Notable for the following:    Sodium 134 (*)    Chloride 89 (*)    CO2 33 (*)    Glucose, Bld 128 (*)    BUN 37 (*)    Creatinine, Ser 6.62 (*)    Calcium 8.1 (*)    Albumin 3.3 (*)    ALT 13 (*)    Alkaline Phosphatase 184 (*)    GFR calc non Af Amer 7 (*)    GFR calc Af Amer 8 (*)    All other components within normal limits  LIPASE, BLOOD  TROPONIN I   CT abdomen, pelvis significant for colitis of the terminal ileum and proximal colon without abscess or perforation  I assumed care of the patient from Dr. Mayford KnifeWilliams at 3:00 PM pending CT scan. Labs are now available have been reviewed. Vital signs are stable and within normal limits. Pain is controlled. Patient is tolerating by mouth and feeling well. If not for this abdominal tenderness being incidentally noted at dialysis today, he reports that he would not come to the ED for care. His family reports a prior history of colitis 1 year ago that resolved with antibiotics. We'll start him on antibiotics today to treat for enteric pathogens and have him follow up with primary care. He is medically stable and good condition. Pain controlled, has been counseled on the diagnosis and treatment plan.  Clinical impression acute colitis of proximal colon  Sharman CheekPhillip Sophira Rumler, MD 01/15/15 1755

## 2015-03-01 IMAGING — CT CT HEAD WITHOUT CONTRAST
1 series · 16 of 30 positions shown, 20 images · non-contrast
Comparison: 11/10/2012.

CLINICAL DATA: Altered mental status.  Lethargy.

EXAM:
CT HEAD WITHOUT CONTRAST
TECHNIQUE: Contiguous axial images were obtained from the base of the skull
through the vertex without intravenous contrast.

[Series 2: soft tissue · axial · 0.43mm/px · z∈[-212,-78]mm · 16 of 31 slices shown, 20 images]
[im 2/31  brain]
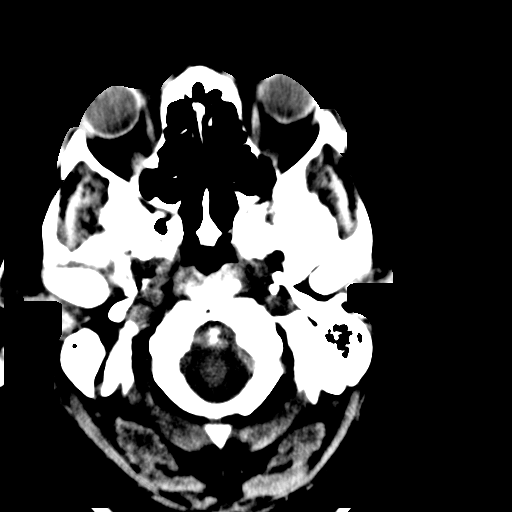
[im 2/31  bone]
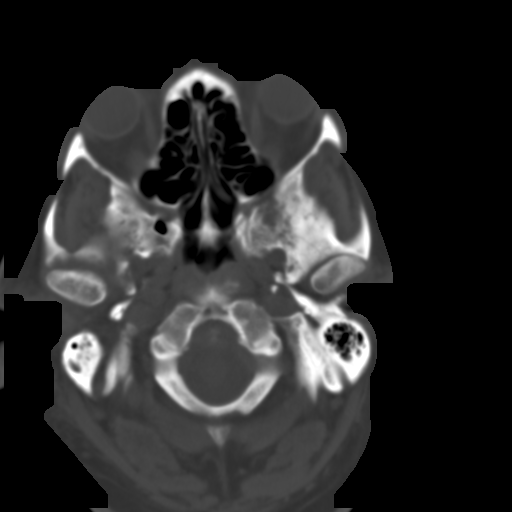
[im 4/31  brain]
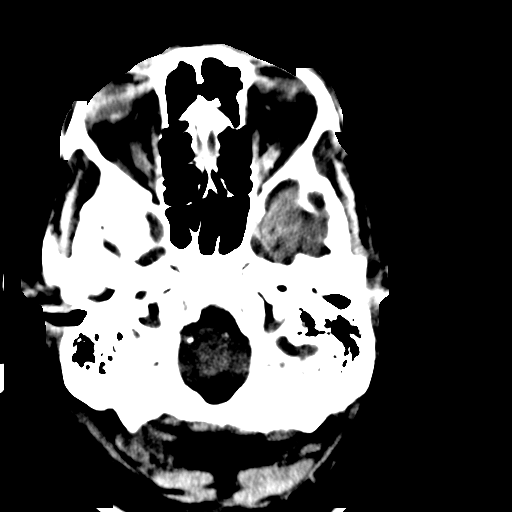
[im 6/31  brain]
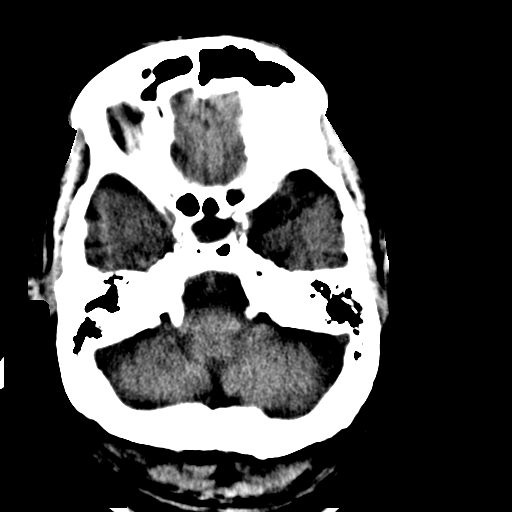
[im 8/31  brain]
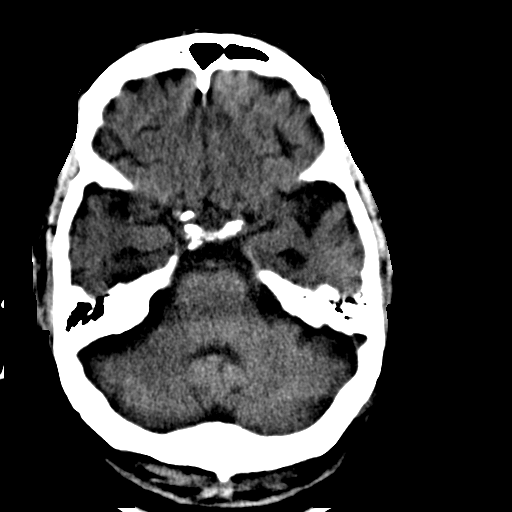
[im 9/31  brain]
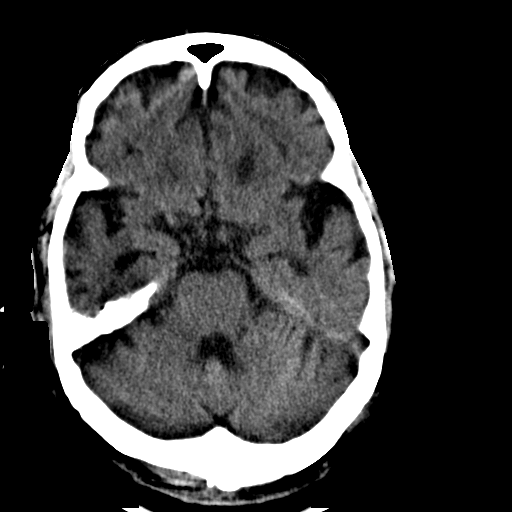
[im 9/31  bone]
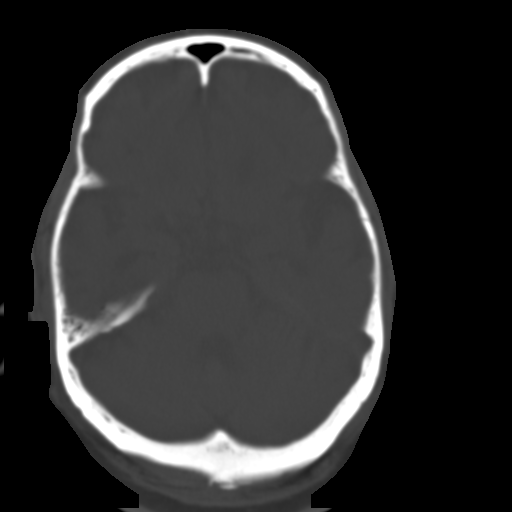
[im 11/31  brain]
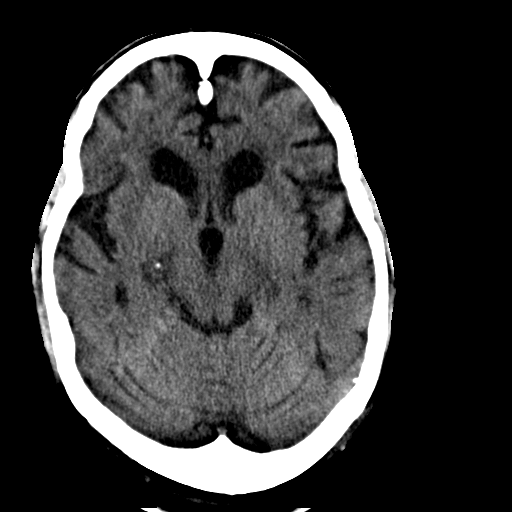
[im 13/31  brain]
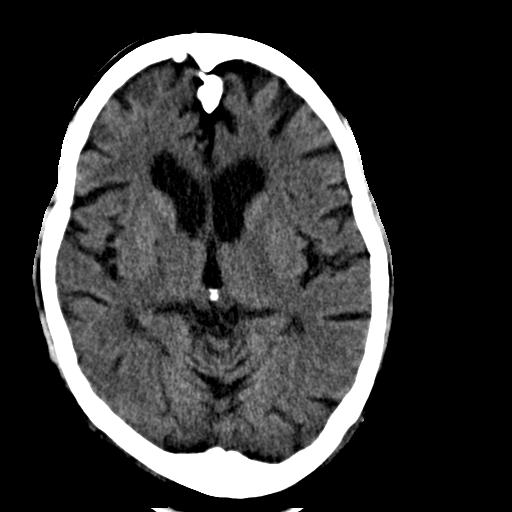
[im 15/31  brain]
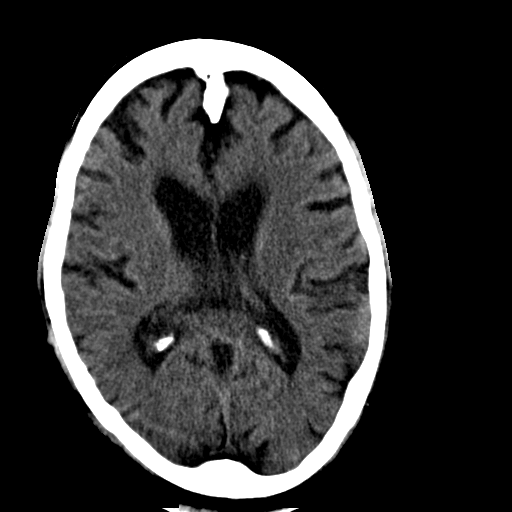
[im 16/31  brain]
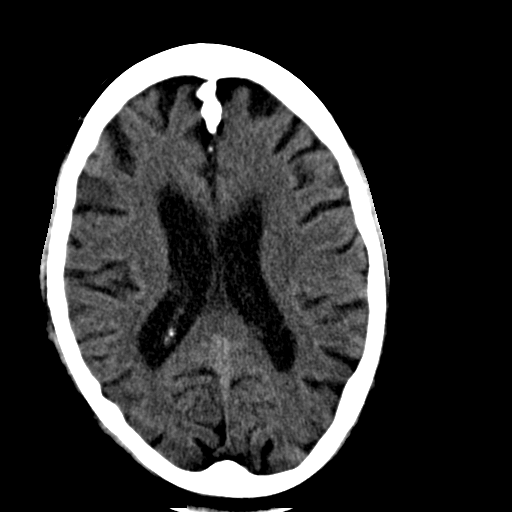
[im 16/31  bone]
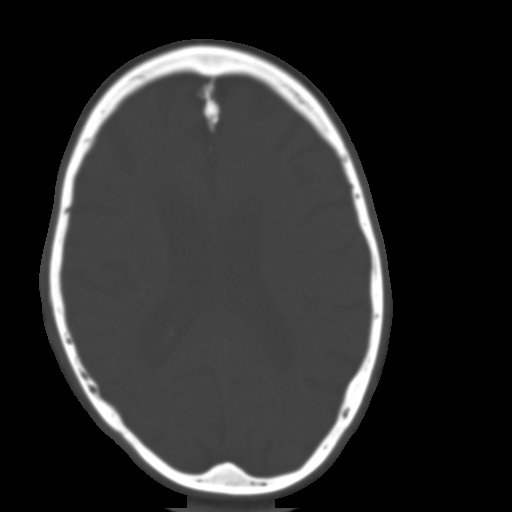
[im 18/31  brain]
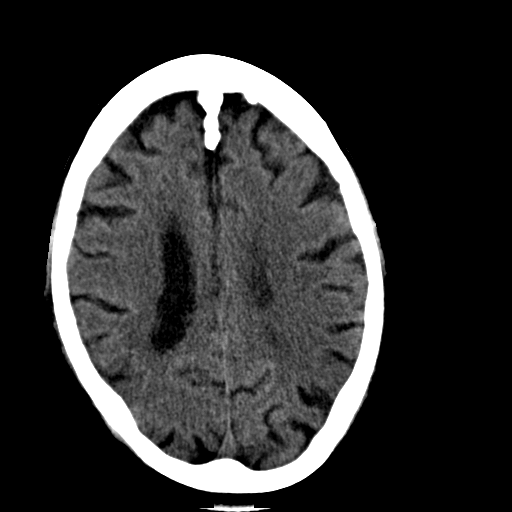
[im 20/31  brain]
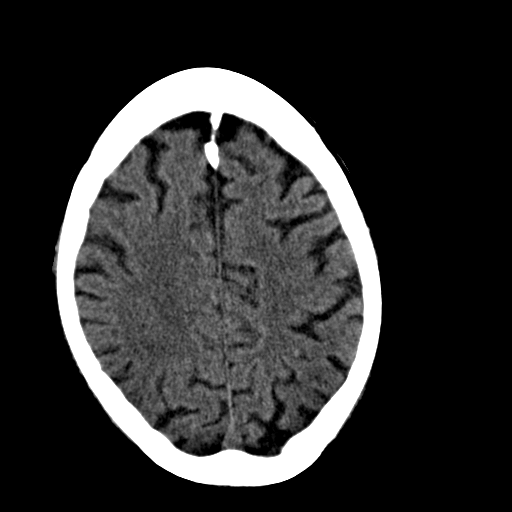
[im 22/31  brain]
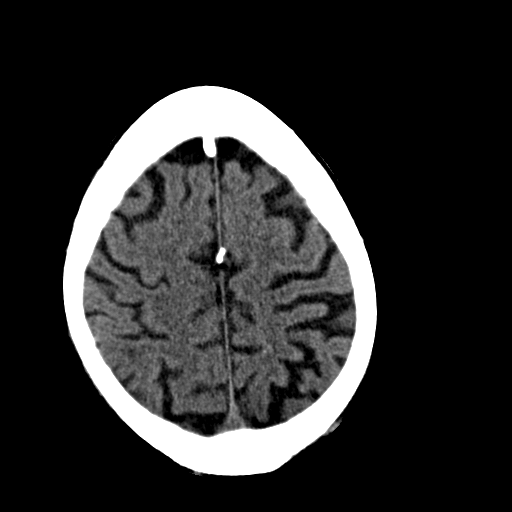
[im 23/31  brain]
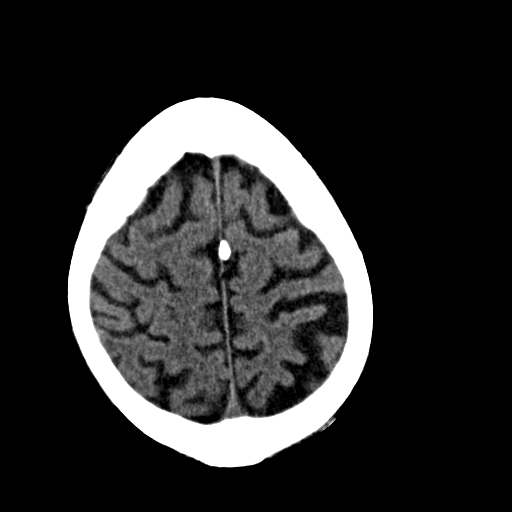
[im 23/31  bone]
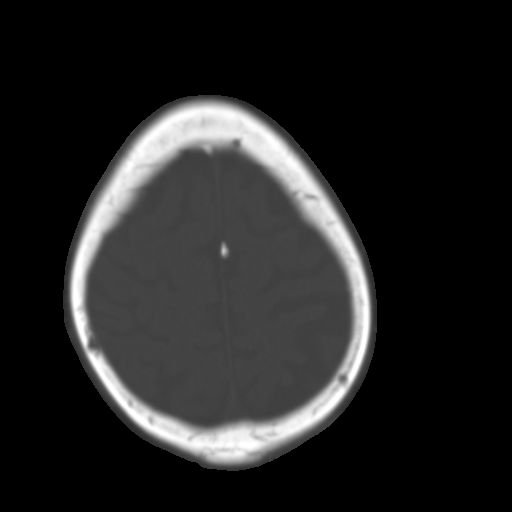
[im 25/31  brain]
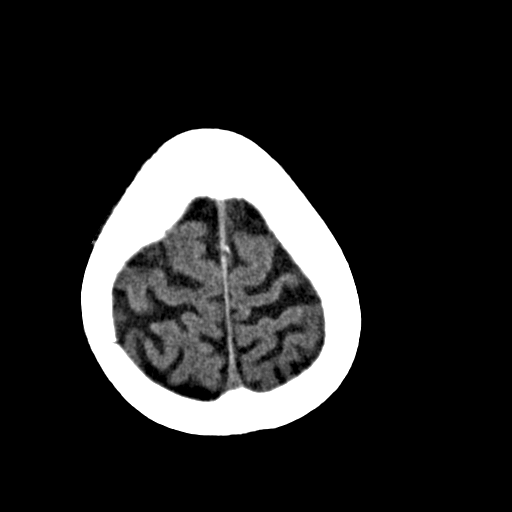
[im 27/31  brain]
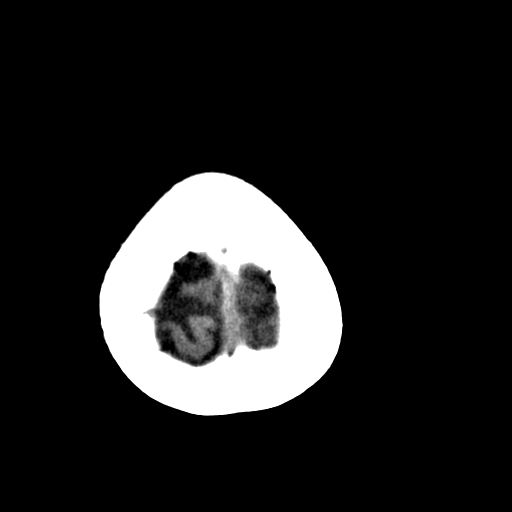
[im 29/31  brain]
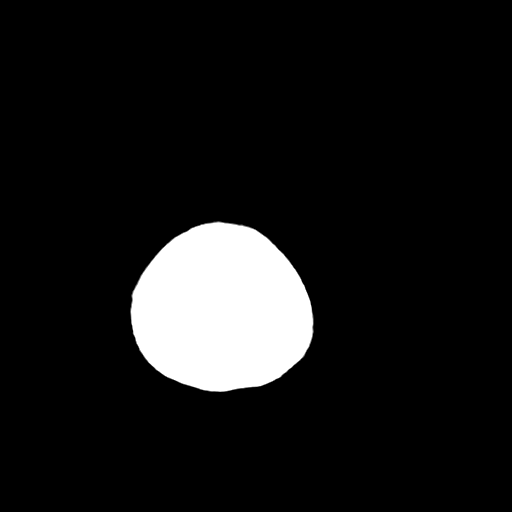

[16 of 30 positions shown; findings below may reference images not displayed]

FINDINGS: No intracranial hemorrhage.

Remote lacune infarct right basal ganglia.

Small vessel disease type changes without CT evidence of large acute
infarct.

Vascular calcifications.

Global atrophy without hydrocephalus.

Partially empty sella.

No intracranial mass lesion noted on this unenhanced exam.

Ossification of the falx most notable anteriorly stable in
appearance.
IMPRESSION: No intracranial hemorrhage or CT evidence of large acute infarct.

Atrophy without hydrocephalus.

Vascular calcifications.

## 2015-06-02 ENCOUNTER — Encounter: Admission: AD | Payer: Self-pay | Source: Ambulatory Visit

## 2015-06-02 ENCOUNTER — Ambulatory Visit: Admission: AD | Admit: 2015-06-02 | Payer: Self-pay | Source: Ambulatory Visit | Admitting: Vascular Surgery

## 2015-06-02 ENCOUNTER — Emergency Department
Admission: EM | Admit: 2015-06-02 | Discharge: 2015-06-03 | Disposition: A | Discharge status: Home / Self Care | Payer: Medicare Other | Attending: Student | Admitting: Student

## 2015-06-02 DIAGNOSIS — E1122 Type 2 diabetes mellitus with diabetic chronic kidney disease: Secondary | ICD-10-CM | POA: Insufficient documentation

## 2015-06-02 DIAGNOSIS — Z79899 Other long term (current) drug therapy: Secondary | ICD-10-CM | POA: Insufficient documentation

## 2015-06-02 DIAGNOSIS — T82898A Other specified complication of vascular prosthetic devices, implants and grafts, initial encounter: Secondary | ICD-10-CM

## 2015-06-02 DIAGNOSIS — Z7982 Long term (current) use of aspirin: Secondary | ICD-10-CM | POA: Insufficient documentation

## 2015-06-02 DIAGNOSIS — Z794 Long term (current) use of insulin: Secondary | ICD-10-CM | POA: Diagnosis not present

## 2015-06-02 DIAGNOSIS — E119 Type 2 diabetes mellitus without complications: Secondary | ICD-10-CM | POA: Diagnosis not present

## 2015-06-02 DIAGNOSIS — I739 Peripheral vascular disease, unspecified: Secondary | ICD-10-CM | POA: Insufficient documentation

## 2015-06-02 DIAGNOSIS — T8249XA Other complication of vascular dialysis catheter, initial encounter: Secondary | ICD-10-CM | POA: Insufficient documentation

## 2015-06-02 DIAGNOSIS — Z87891 Personal history of nicotine dependence: Secondary | ICD-10-CM | POA: Diagnosis not present

## 2015-06-02 DIAGNOSIS — N186 End stage renal disease: Secondary | ICD-10-CM | POA: Insufficient documentation

## 2015-06-02 DIAGNOSIS — I120 Hypertensive chronic kidney disease with stage 5 chronic kidney disease or end stage renal disease: Secondary | ICD-10-CM | POA: Insufficient documentation

## 2015-06-02 DIAGNOSIS — Z7902 Long term (current) use of antithrombotics/antiplatelets: Secondary | ICD-10-CM | POA: Diagnosis not present

## 2015-06-02 DIAGNOSIS — Y832 Surgical operation with anastomosis, bypass or graft as the cause of abnormal reaction of the patient, or of later complication, without mention of misadventure at the time of the procedure: Secondary | ICD-10-CM | POA: Diagnosis not present

## 2015-06-02 DIAGNOSIS — I252 Old myocardial infarction: Secondary | ICD-10-CM | POA: Insufficient documentation

## 2015-06-02 DIAGNOSIS — Z992 Dependence on renal dialysis: Secondary | ICD-10-CM | POA: Insufficient documentation

## 2015-06-02 DIAGNOSIS — I2510 Atherosclerotic heart disease of native coronary artery without angina pectoris: Secondary | ICD-10-CM | POA: Insufficient documentation

## 2015-06-02 DIAGNOSIS — Y841 Kidney dialysis as the cause of abnormal reaction of the patient, or of later complication, without mention of misadventure at the time of the procedure: Secondary | ICD-10-CM | POA: Diagnosis not present

## 2015-06-02 LAB — COMPREHENSIVE METABOLIC PANEL
ALBUMIN: 2.8 g/dL — AB (ref 3.5–5.0)
ALK PHOS: 140 U/L — AB (ref 38–126)
ALT: 12 U/L — ABNORMAL LOW (ref 17–63)
AST: 10 U/L — AB (ref 15–41)
Anion gap: 13 (ref 5–15)
BUN: 47 mg/dL — AB (ref 6–20)
CALCIUM: 8 mg/dL — AB (ref 8.9–10.3)
CO2: 30 mmol/L (ref 22–32)
Chloride: 91 mmol/L — ABNORMAL LOW (ref 101–111)
Creatinine, Ser: 9.36 mg/dL — ABNORMAL HIGH (ref 0.61–1.24)
GFR calc Af Amer: 5 mL/min — ABNORMAL LOW (ref >60)
GFR calc non Af Amer: 5 mL/min — ABNORMAL LOW (ref >60)
GLUCOSE: 100 mg/dL — AB (ref 65–99)
POTASSIUM: 4.6 mmol/L (ref 3.5–5.1)
Sodium: 134 mmol/L — ABNORMAL LOW (ref 135–145)
Total Bilirubin: 0.3 mg/dL (ref 0.3–1.2)
Total Protein: 6.7 g/dL (ref 6.5–8.1)

## 2015-06-02 LAB — CBC WITH DIFFERENTIAL/PLATELET
BASOS ABS: 0 10*3/uL (ref 0–0.1)
BASOS PCT: 0 %
Eosinophils Absolute: 0.4 10*3/uL (ref 0–0.7)
Eosinophils Relative: 4 %
HEMATOCRIT: 34.8 % — AB (ref 40.0–52.0)
HEMOGLOBIN: 11.5 g/dL — AB (ref 13.0–18.0)
Lymphocytes Relative: 22 %
Lymphs Abs: 1.9 10*3/uL (ref 1.0–3.6)
MCH: 29.7 pg (ref 26.0–34.0)
MCHC: 32.9 g/dL (ref 32.0–36.0)
MCV: 90.3 fL (ref 80.0–100.0)
MONOS PCT: 12 %
Monocytes Absolute: 1.1 10*3/uL — ABNORMAL HIGH (ref 0.2–1.0)
NEUTROS ABS: 5.3 10*3/uL (ref 1.4–6.5)
NEUTROS PCT: 62 %
Platelets: 193 10*3/uL (ref 150–440)
RBC: 3.86 MIL/uL — ABNORMAL LOW (ref 4.40–5.90)
RDW: 16.3 % — ABNORMAL HIGH (ref 11.5–14.5)
WBC: 8.7 10*3/uL (ref 3.8–10.6)

## 2015-06-02 LAB — PROTIME-INR
INR: 1.11
Prothrombin Time: 14.5 seconds (ref 11.4–15.0)

## 2015-06-02 LAB — APTT: aPTT: 37 seconds — ABNORMAL HIGH (ref 24–36)

## 2015-06-02 SURGERY — THROMBECTOMY
Anesthesia: Moderate Sedation | Laterality: Left

## 2015-06-02 NOTE — ED Notes (Signed)
Attempted x2 IV's , unsuccessful , Dr.Gayle aware , Lab notified for assistance for blood draw

## 2015-06-02 NOTE — Discharge Instructions (Signed)
Our nephrologist will call your Dialysis Center to arrange revision of your fistula tomorrow. If you do not hear from them by 10 AM, call your dialysis center for details regarding the visit. Return immediately to the emergency department if you develop chest pain, difficulty breathing, palpitations, vomiting, fevers, severe headache, numbness or weakness of her any new concerns.

## 2015-06-02 NOTE — ED Notes (Signed)
Patient to return to nursing home. Report called and will need to send patient via EMS.

## 2015-06-02 NOTE — ED Notes (Signed)
Have attempted multiple times to establish IV and obtain blood samples. Lab will now attempt.

## 2015-06-02 NOTE — ED Notes (Signed)
Patient at dialysis and unable to access the fistula. Staff thought patient should come here rather than going home.

## 2015-06-02 NOTE — ED Provider Notes (Signed)
[facility] Emergency Department Provider Note  ____________________________________________  Time seen: Approximately 4:08 PM  I have reviewed the triage vital signs and the nursing notes.   HISTORY  Chief Complaint Vascular Access Problem    HPI [name] is a 80 y.o. male with history of coronary artery disease, and stage renal disease on dialysis Monday Wednesday Friday, last dialyzed on Friday who presents from dialysis because of malfunctioning AV fistula which was noted suddenly just prior to arrival. The patient went for his regular scheduled dialysis treatment today and staff was unable to access his fistula despite multiple attempts. He was sent to the emergency department for further evaluation. He received his full dialysis treatment on Friday. He denies any pain complaints or recent illness. No chest pain, difficulty breathing, nausea, vomiting, diarrhea, fevers or chills. Currently, he has no symptoms. There are no modifying factors.   Past Medical History  Diagnosis Date  . Acute MI, lateral wall, initial episode of care (HCC)   . ESRD (end stage renal disease) (HCC)     on HD M/W/F  . Diabetes (HCC)   . Non-ST elevation myocardial infarction (NSTEMI), initial care episode (HCC) 06/25/2014    Mild LAD disease, circumflex 90%-->0% with  2.75 x 16 Promus DES, RCA subtotally occluded with left-right collaterals, EF 45-50% by echo    . HTN (hypertension)   . PVD (peripheral vascular disease) [facility])     Patient Active Problem List   Diagnosis Date Noted  . NSTEMI (non-ST elevated myocardial infarction) (HCC) 06/25/2014  . Acute MI, lateral wall, initial episode of care (HCC)   . ESRD (end stage renal disease) [facility])     Past Surgical History  Procedure Laterality Date  . R leg surgery    . Cardiac catheterization  06/25/2014    NSTEMI s/p DES to LCx, subtotally occluded prox RCA  . Left heart catheterization with coronary angiogram  N/A 06/25/2014    Procedure: LEFT HEART CATHETERIZATION WITH CORONARY ANGIOGRAM;  Surgeon: Corky Crafts, MD;  Location: [facility] CATH LAB;  Service: Cardiovascular;  Laterality: N/A;  . Percutaneous coronary stent intervention (pci-s)  06/25/2014    Procedure: PERCUTANEOUS CORONARY STENT INTERVENTION (PCI-S);  Surgeon: Corky Crafts, MD;  Location: [facility] CATH LAB;  Service: Cardiovascular;;    Current Outpatient Rx  Name  Route  Sig  Dispense  Refill  . acetaminophen (TYLENOL) 650 MG CR tablet   Oral   Take 650 mg by mouth every 4 (four) hours as needed for pain or fever.         Marland Kitchen albuterol (PROVENTIL HFA;VENTOLIN HFA) 108 (90 BASE) MCG/ACT inhaler   Inhalation   Inhale 2 puffs into the lungs every 6 (six) hours as needed for wheezing or shortness of breath.         . Amino Acids-Protein Hydrolys (FEEDING SUPPLEMENT, PRO-STAT SUGAR FREE 64,) LIQD   Oral   Take 30 mLs by mouth daily.         Marland Kitchen amLODipine (NORVASC) 2.5 MG tablet   Oral   Take 2.5 mg by mouth daily.         Marland Kitchen aspirin EC 81 MG tablet   Oral   Take 81 mg by mouth daily.         Marland Kitchen b complex-vitamin c-folic acid (NEPHRO-VITE) 0.8 MG TABS tablet   Oral   Take 1 tablet by mouth at bedtime.         . cinacalcet (SENSIPAR) 60 MG  tablet   Oral   Take 60 mg by mouth daily.         . ciprofloxacin (CIPRO) 500 MG tablet   Oral   Take 1 tablet (500 mg total) by mouth 2 (two) times daily.   14 tablet   0   . clopidogrel (PLAVIX) 75 MG tablet   Oral   Take 75 mg by mouth daily.         . ferrous sulfate 325 (65 FE) MG tablet   Oral   Take 325 mg by mouth 2 (two) times daily with a meal.         . glimepiride (AMARYL) 4 MG tablet   Oral   Take 4 mg by mouth daily with breakfast.         . insulin glargine (LANTUS) 100 UNIT/ML injection   Subcutaneous   Inject 11 Units into the skin every morning.         Marland Kitchen losartan (COZAAR) 25 MG tablet   Oral   Take 1 tablet (25 mg total) by mouth  daily.   30 tablet   11   . metoprolol (LOPRESSOR) 50 MG tablet   Oral   Take 50 mg by mouth 2 (two) times daily.         . metroNIDAZOLE (FLAGYL) 500 MG tablet   Oral   Take 1 tablet (500 mg total) by mouth 3 (three) times daily.   30 tablet   0   . nitroGLYCERIN (NITROSTAT) 0.6 MG SL tablet   Sublingual   Place 0.6 mg under the tongue every 5 (five) minutes as needed for chest pain.         . Nutritional Supplements (NEPRO) LIQD   Oral   Take 8 oz by mouth 2 (two) times daily.         Marland Kitchen oxyCODONE (OXY IR/ROXICODONE) 5 MG immediate release tablet   Oral   Take 5 mg by mouth every 6 (six) hours as needed (pain).         . pravastatin (PRAVACHOL) 20 MG tablet   Oral   Take 20 mg by mouth at bedtime.            Allergies Contrast media and Fish allergy  Family History  Problem Relation Age of Onset  . Bone cancer Father   . Hypertension Father   . Diabetes Mother   . Blindness Mother     Social History Social History  Substance Use Topics  . Smoking status: Former Smoker    Quit date: 08/30/1956  . Smokeless tobacco: None     Comment: quit 57 yrs ago  . Alcohol Use: No     Comment: occasionally    Review of Systems Constitutional: No fever/chills Eyes: No visual changes. ENT: No sore throat. Cardiovascular: Denies chest pain. Respiratory: Denies shortness of breath. Gastrointestinal: No abdominal pain.  No nausea, no vomiting.  No diarrhea.  No constipation. Genitourinary: Negative for dysuria. Musculoskeletal: Negative for back pain. Skin: Negative for rash. Neurological: Negative for headaches, focal weakness or numbness.  10-point ROS otherwise negative.  ____________________________________________   PHYSICAL EXAM:  Filed Vitals:   06/02/15 1610  BP: 134/64  Pulse: 60  Temp: 97.7 F (36.5 C)  TempSrc: Oral  Height:  (1.753 m)  Weight: 230 lb (104.327 kg)  SpO2: 100%     Constitutional: Alert and oriented. Well  appearing and in no acute distress. Eyes: Conjunctivae are normal. PERRL. EOMI. Head: Atraumatic. Nose: No  congestion/rhinnorhea. Mouth/Throat: Mucous membranes are moist.  Oropharynx non-erythematous. Neck: No stridor.   Cardiovascular: Normal rate, regular rhythm. Grossly normal heart sounds.  Good peripheral circulation. Respiratory: Normal respiratory effort.  No retractions. Lungs CTAB. Gastrointestinal: Soft and nontender. No distention. No abdominal bruits. No CVA tenderness. Genitourinary: deferred Musculoskeletal: No lower extremity tenderness nor edema.  No joint effusions. Metal brace on the RLE. There is a fistula with palpable pulse in the left upper arm without erythema, drainage,or tenderness Neurologic:  Normal speech and language. No gross focal neurologic deficits are appreciated.  Skin:  Skin is warm, dry and intact. No rash noted. Psychiatric: Mood and affect are normal. Speech and behavior are normal.  ____________________________________________   LABS (all labs ordered are listed, but only abnormal results are displayed)  Labs Reviewed  CBC WITH DIFFERENTIAL/PLATELET - Abnormal; Notable for the following:    RBC 3.86 (*)    Hemoglobin 11.5 (*)    HCT 34.8 (*)    RDW 16.3 (*)    Monocytes Absolute 1.1 (*)    All other components within normal limits  COMPREHENSIVE METABOLIC PANEL - Abnormal; Notable for the following:    Sodium 134 (*)    Chloride 91 (*)    Glucose, Bld 100 (*)    BUN 47 (*)    Creatinine, Ser 9.36 (*)    Calcium 8.0 (*)    Albumin 2.8 (*)    AST 10 (*)    ALT 12 (*)    Alkaline Phosphatase 140 (*)    GFR calc non Af Amer 5 (*)    GFR calc Af Amer 5 (*)    All other components within normal limits  APTT  PROTIME-INR   ____________________________________________  EKG  ED ECG REPORT I, Gayla Doss, the attending physician, personally viewed and interpreted this ECG.   Date: 06/02/2015  EKG Time: 16:11  Rate: 55  Rhythm:  sinus bradycardia with 1st degree AV block  Axis: left axis  Intervals:first-degree A-V block   ST&T Change: No acute ST elevation. Q waves in the anterior leads as well as leads 3, aVF. Compared to EKG on 01/15/2015, there is no change  ____________________________________________  RADIOLOGY  none ____________________________________________   PROCEDURES  Procedure(s) performed: None  Critical Care performed: No  ____________________________________________   INITIAL IMPRESSION / ASSESSMENT AND PLAN / ED COURSE  Pertinent labs & imaging results that were available during my care of the patient were reviewed by me and considered in my medical decision making (see chart for details).  [name] is a 80 y.o. male with history of coronary artery disease, and stage renal disease on dialysis Monday Wednesday Friday, last dialyzed on Friday who presents from dialysis because of malfunctioning AV fistula. On exam, he is very well-appearing and in no acute distress with no complaints. Labs notable for mild anemia with hemoglobin 11.5 which is chronic. Creatinine is elevated 9.36 which is expected in the setting of his known end-stage renal disease. Normal potassium of 4.6. He has no respiratory complaints, clear lungs, no hypoxia or increased work of breathing. There is no acute indication for dialysis this evening. I discussed the case with Dr.Kollru of nephrology who will be in contact with the patient's dialysis center and will arrange access revision for tomorrow. I discussed this with the patient as well as return precautions and he is comfortable with the discharge plan. ____________________________________________   FINAL CLINICAL IMPRESSION(S) / ED DIAGNOSES  Final diagnoses:  AV fistula occlusion, initial  encounter [facility])      Gayla Doss, MD 06/02/15 2001

## 2015-06-03 ENCOUNTER — Ambulatory Visit: Admit: 2015-06-03 | Payer: Self-pay | Admitting: Vascular Surgery

## 2015-06-03 ENCOUNTER — Encounter: Payer: Self-pay | Admitting: Emergency Medicine

## 2015-06-03 ENCOUNTER — Emergency Department
Admission: EM | Admit: 2015-06-03 | Discharge: 2015-06-03 | Disposition: A | Discharge status: Skilled Nursing Facility | Payer: Medicare Other | Source: Home / Self Care | Attending: Emergency Medicine | Admitting: Emergency Medicine

## 2015-06-03 ENCOUNTER — Encounter: Payer: Self-pay | Admitting: Certified Registered Nurse Anesthetist

## 2015-06-03 ENCOUNTER — Encounter
Admission: EM | Disposition: A | Discharge status: Skilled Nursing Facility | Payer: Self-pay | Source: Home / Self Care | Attending: Emergency Medicine

## 2015-06-03 DIAGNOSIS — T8249XA Other complication of vascular dialysis catheter, initial encounter: Secondary | ICD-10-CM | POA: Diagnosis not present

## 2015-06-03 DIAGNOSIS — T82590A Other mechanical complication of surgically created arteriovenous fistula, initial encounter: Secondary | ICD-10-CM

## 2015-06-03 HISTORY — PX: PERIPHERAL VASCULAR CATHETERIZATION: SHX172C

## 2015-06-03 LAB — POTASSIUM (ARMC VASCULAR LAB ONLY): Potassium (ARMC vascular lab): 4.5

## 2015-06-03 SURGERY — A/V SHUNT INTERVENTION
Anesthesia: Moderate Sedation

## 2015-06-03 MED ORDER — BACITRACIN-NEOMYCIN-POLYMYXIN 400-5-5000 EX OINT
TOPICAL_OINTMENT | CUTANEOUS | Status: AC
  Filled 2015-06-03: qty 1

## 2015-06-03 MED ORDER — METHYLPREDNISOLONE SODIUM SUCC 125 MG IJ SOLR
INTRAMUSCULAR | Status: AC
  Administered 2015-06-03: 125 mg
  Filled 2015-06-03: qty 2

## 2015-06-03 MED ORDER — HYDROMORPHONE HCL 1 MG/ML IJ SOLN: 1.0000 mg | INTRAMUSCULAR | Status: DC | PRN

## 2015-06-03 MED ORDER — METHYLPREDNISOLONE SODIUM SUCC 125 MG IJ SOLR
INTRAMUSCULAR | Status: AC
  Filled 2015-06-03: qty 2

## 2015-06-03 MED ORDER — HEPARIN (PORCINE) IN NACL 2-0.9 UNIT/ML-% IJ SOLN
INTRAMUSCULAR | Status: AC
  Filled 2015-06-03: qty 1000

## 2015-06-03 MED ORDER — ONDANSETRON HCL 4 MG/2ML IJ SOLN: 4.0000 mg | Freq: Four times a day (QID) | INTRAMUSCULAR | Status: DC | PRN

## 2015-06-03 MED ORDER — CEFAZOLIN SODIUM 1-5 GM-% IV SOLN
INTRAVENOUS | Status: AC
  Filled 2015-06-03: qty 50

## 2015-06-03 MED ORDER — METHYLPREDNISOLONE SODIUM SUCC 125 MG IJ SOLR
125.0000 mg | Freq: Once | INTRAMUSCULAR | Status: AC
  Administered 2015-06-03: 125 mg via INTRAVENOUS

## 2015-06-03 MED ORDER — HEPARIN SODIUM (PORCINE) 1000 UNIT/ML IJ SOLN
INTRAMUSCULAR | Status: DC | PRN
  Administered 2015-06-03: 4000 [IU] via INTRAVENOUS

## 2015-06-03 MED ORDER — SODIUM CHLORIDE 0.9 % IV SOLN
INTRAVENOUS | Status: DC
  Administered 2015-06-03: 10:00:00 via INTRAVENOUS

## 2015-06-03 MED ORDER — HEPARIN SODIUM (PORCINE) 1000 UNIT/ML IJ SOLN
INTRAMUSCULAR | Status: AC
  Filled 2015-06-03: qty 1

## 2015-06-03 MED ORDER — LIDOCAINE HCL (PF) 1 % IJ SOLN
INTRAMUSCULAR | Status: AC
  Filled 2015-06-03: qty 10

## 2015-06-03 MED ORDER — ONDANSETRON HCL 4 MG/2ML IJ SOLN: 4.0000 mg | INTRAMUSCULAR | Status: DC | PRN

## 2015-06-03 MED ORDER — FAMOTIDINE 20 MG PO TABS
ORAL_TABLET | ORAL | Status: AC
  Administered 2015-06-03: 20 mg
  Filled 2015-06-03: qty 1

## 2015-06-03 MED ORDER — OXYCODONE HCL 5 MG PO TABS: 5.0000 mg | ORAL_TABLET | ORAL | Status: DC | PRN

## 2015-06-03 MED ORDER — CEFAZOLIN SODIUM 1-5 GM-% IV SOLN
1.0000 g | Freq: Once | INTRAVENOUS | Status: AC
  Administered 2015-06-03: 1 g via INTRAVENOUS

## 2015-06-03 MED ORDER — HYDROMORPHONE HCL 1 MG/ML IJ SOLN: 0.5000 mg | INTRAMUSCULAR | Status: DC | PRN

## 2015-06-03 MED ORDER — LIDOCAINE HCL (PF) 1 % IJ SOLN
INTRAMUSCULAR | Status: DC | PRN
  Administered 2015-06-03: 5 mL via INTRADERMAL

## 2015-06-03 MED ORDER — ATROPINE SULFATE 0.1 MG/ML IJ SOLN: 0.5000 mg | Freq: Once | INTRAMUSCULAR | Status: DC | PRN

## 2015-06-03 MED ORDER — ALTEPLASE 2 MG IJ SOLR
INTRAMUSCULAR | Status: AC
  Filled 2015-06-03: qty 6

## 2015-06-03 MED ORDER — IOHEXOL 300 MG/ML  SOLN
INTRAMUSCULAR | Status: DC | PRN
Start: 2015-06-03 — End: 2015-06-03
  Administered 2015-06-03: 25 mL

## 2015-06-03 MED ORDER — MIDAZOLAM HCL 2 MG/2ML IJ SOLN
INTRAMUSCULAR | Status: DC | PRN
  Administered 2015-06-03 (×2): 2 mg via INTRAVENOUS

## 2015-06-03 MED ORDER — METHYLPREDNISOLONE SODIUM SUCC 125 MG IJ SOLR: 125.0000 mg | Freq: Once | INTRAMUSCULAR | Status: AC

## 2015-06-03 MED ORDER — DIPHENHYDRAMINE HCL 50 MG/ML IJ SOLN: 25.0000 mg | Freq: Once | INTRAMUSCULAR | Status: DC

## 2015-06-03 MED ORDER — ACETAMINOPHEN 325 MG PO TABS: 325.0000 mg | ORAL_TABLET | ORAL | Status: DC | PRN

## 2015-06-03 MED ORDER — FENTANYL CITRATE (PF) 100 MCG/2ML IJ SOLN
INTRAMUSCULAR | Status: DC | PRN
  Administered 2015-06-03 (×2): 50 ug via INTRAVENOUS

## 2015-06-03 MED ORDER — DEXTROSE 50 % IV SOLN: 0.5000 | Freq: Once | INTRAVENOUS | Status: DC | PRN

## 2015-06-03 MED ORDER — MIDAZOLAM HCL 5 MG/5ML IJ SOLN
INTRAMUSCULAR | Status: AC
  Filled 2015-06-03: qty 5

## 2015-06-03 MED ORDER — FENTANYL CITRATE (PF) 100 MCG/2ML IJ SOLN
INTRAMUSCULAR | Status: AC
  Filled 2015-06-03: qty 2

## 2015-06-03 MED ORDER — ACETAMINOPHEN 325 MG RE SUPP: 325.0000 mg | RECTAL | Status: DC | PRN

## 2015-06-03 SURGICAL SUPPLY — 18 items
BALLN DORADO 8X60X80 (BALLOONS) ×4
BALLN DORADO 9X40X80 (BALLOONS) ×4
BALLN LUTONIX DCB 6X80X130 (BALLOONS) ×4
BALLOON DORADO 8X60X80 (BALLOONS) ×2 IMPLANT
BALLOON DORADO 9X40X80 (BALLOONS) ×2 IMPLANT
BALLOON LUTONIX DCB 6X80X130 (BALLOONS) ×2 IMPLANT
CATH TORCON 5FR 0.38 (CATHETERS) ×4 IMPLANT
DEVICE PRESTO INFLATION (MISCELLANEOUS) ×8 IMPLANT
DEVICE TORQUE (MISCELLANEOUS) ×4 IMPLANT
DRAPE BRACHIAL (DRAPES) ×4 IMPLANT
GLIDECATH NONTAPER ANGL 5FR (CATHETERS) ×4 IMPLANT
GUIDEWIRE ANGLED .035 180CM (WIRE) ×4 IMPLANT
KIT THROMB PERC PTD (MISCELLANEOUS) ×4 IMPLANT
PACK ANGIOGRAPHY (CUSTOM PROCEDURE TRAY) ×4 IMPLANT
SET INTRO CAPELLA COAXIAL (SET/KITS/TRAYS/PACK) ×8 IMPLANT
SHEATH BRITE TIP 6FRX5.5 (SHEATH) ×12 IMPLANT
SUT MNCRL AB 4-0 PS2 18 (SUTURE) ×4 IMPLANT
WIRE MAGIC TORQUE 260C (WIRE) ×4 IMPLANT

## 2015-06-03 NOTE — OR Nursing (Signed)
Left upper arm dressing bleeding noted. Pressure applied for 5 minutes and redressed.

## 2015-06-03 NOTE — Discharge Instructions (Signed)
Fistulogram, Care After Refer to this sheet in the next few weeks. These instructions provide you with information on caring for yourself after your procedure. Your health care provider may also give you more specific instructions. Your treatment has been planned according to current medical practices, but problems sometimes occur. Call your health care provider if you have any problems or questions after your procedure. WHAT TO EXPECT AFTER THE PROCEDURE After your procedure, it is typical to have the following:  A small amount of discomfort in the area where the catheters were placed.  A small amount of bruising around the fistula.  Sleepiness and fatigue. HOME CARE INSTRUCTIONS  Rest at home for the day following your procedure.  Do not drive or operate heavy machinery while taking pain medicine.  Take medicines only as directed by your health care provider.  Do not take baths, swim, or use a hot tub until your health care provider approves. You may shower 24 hours after the procedure or as directed by your health care provider.  There are many different ways to close and cover an incision, including stitches, skin glue, and adhesive strips. Follow your health care provider's instructions on:  Incision care.  Bandage (dressing) changes and removal.  Incision closure removal.  Monitor your dialysis fistula carefully. SEEK MEDICAL CARE IF:  You have drainage, redness, swelling, or pain at your catheter site.  You have a fever.  You have chills. SEEK IMMEDIATE MEDICAL CARE IF:  You feel weak.  You have trouble balancing.  You have trouble moving your arms or legs.  You have problems with your speech or vision.  You can no longer feel a vibration or buzz when you put your fingers over your dialysis fistula.  The limb that was used for the procedure:  Swells.  Is painful.  Is cold.  Is discolored, such as blue or pale white. Document Released: 12/31/2013  Document Reviewed: 10/05/2013 [facility] Patient Information 2015 [place], [place]. This information is not intended to replace advice given to you by your health care provider. Make sure you discuss any questions you have with your health care provider.  Call for follow appointment in 3-4 weeks with  Vein and vascular. Also needs Duplex Ultrasound of AV Access. [phone]

## 2015-06-03 NOTE — Op Note (Signed)
OPERATIVE NOTE   PROCEDURE: 1. Contrast injection left brachial axillary dialysis graft 2. Infusion of thrombolytics left brachial axillary dialysis graft 3. Mechanical thrombectomy left brachial axillary dialysis graft 4. Percutaneous transluminal angioplasty to 8 mm venous outflow left arm brachial axillary AV graft 5. Percutaneous transluminal angioplasty to 6 mm and 9 mm arterial anastomosis left brachial axillary dialysis graft 6. Introduction catheter into axillary artery 7. Percutaneous transluminal angioplasty of the proximal brachial artery to 6 mm using a Lutonix balloon  PRE-OPERATIVE DIAGNOSIS: Complication of dialysis access                                                       End Stage Renal Disease  POST-OPERATIVE DIAGNOSIS: same as above   SURGEON: Katha Cabal, M.D.  ANESTHESIA: Conscious Sedation   ESTIMATED BLOOD LOSS: minimal  FINDING(S): 1. Thrombosis of AV graft. Previously placed stents at the arterial portion have been crushed and these are reexpanded with balloon angioplasty. There is a stricture contiguous with this crushed stent that incorporates the arterial anastomosis. Approximately 10 cm proximal to the AV graft the brachial artery demonstrates a 90% string sign. Venous outflow after thrombectomy shows greater than 80% narrowing at the axillary level.  SPECIMEN(S):  None  CONTRAST: 25 cc cc  FLUOROSCOPY TIME: 15 minutes  INDICATIONS: [name] is a 80 y.o. male who  presents with malfunctioning left arm brachial axillary AV access.  The patient is scheduled for angiography with possible intervention of the AV access.  The patient is aware the risks include but are not limited to: bleeding, infection, thrombosis of the cannulated access, and possible anaphylactic reaction to the contrast.  The patient acknowledges if the access can not be salvaged a tunneled catheter will be needed and will be placed during this procedure.  The patient is  aware of the risks of the procedure and elects to proceed with the angiogram and intervention.  DESCRIPTION: After full informed written consent was obtained, the patient was brought back to the Special Procedure suite and placed supine position.  Appropriate cardiopulmonary monitors were placed.  The left arm was prepped and draped in the standard fashion.  Appropriate timeout is called. Ultrasound is then placed in a sterile sleeve. Ultrasound is being utilized secondary to lack of appropriate landmarks with a known multiple stents placed within this graft in order to more safely access the graft. Ultrasound is then used to scan the graft from the arterial anastomosis to the venous outflow. A site is selected in the arterial portion in an antegrade direction. Images recorded for the permanent record. Heterogeneous material is noted throughout the graft consistent with thrombosis. The left arm brachial axillary graft just above the arterial anastomosis  was cannulated with ultrasound guidance with a micropuncture needle.  The microwire was advanced and the needle was exchanged for  a microsheath.  The J-wire was then advanced and a 6 Fr sheath inserted.  Hand injections were completed to image the access which demonstrates thrombus throughout. 4000 units of heparin was given intravenously and 6 mg of TPA in 50 cc is reconstituted.    Floppy Glidewire and Kumpe catheter then negotiated into the central venous system and hand injection contrast demonstrates the central venous system is widely patent including the superior vena cava innominate and subclavian. The proximal axillary  is also patent. Previously placed stents incorporated with the AV access extending to the distal axillary and thrombus is noted in the vein at this level. Thrombus is also noted as above in the stented segment of the graft as well as the graft itself. The 6 mg of TPA is then instilled crossed stented segment as well as throughout the  whole graft and this is allowed to dwell for approximately 20 minutes.  Follow-up imaging demonstrates persistence of thrombus with absence of flow through the graft. Therefore, Trerotola device is then delivered onto the field and used to perform mechanical thrombectomy of the stents distal axillary vein and the AV graft. Significant improvement is noted and attention is turned to the arterial portion.  Using ultrasound once again the graft is identified the area selected is now more proximal on the arm at the level of the deltoid graft is noted to have a homogeneous echolucent area indicating successful thrombectomy in this region is selected for access. Images recorded in a micropuncture is inserted under direct visualization after lidocaine is infiltrated. Microwire followed micro-sheath J-wire followed by 6 French sheath was inserted in a retrograde direction. Using a Kumpe catheter and the Glidewire the crushed area of the stent is negotiated and the catheter is advanced into the brachial artery. Hand injection contrast demonstrates absence of flow into the graft suggesting the crushed stent as well as the arterial anastomosis or problematic also with reflux of contrast up to the axillary artery a separate and distinct stricture of 90% is noted. Near the axillary artery this is approximately 10 cm proximal to the arterial anastomosis of the AV graft and therefore a separate and distinct arterial lesion. With the guidewire place a 6 mm Lutonix balloon is advanced across this lesion inflated to 14 atm for 2 full minutes. Follow-up imaging through a Kumpe catheter exchanged over the wire demonstrates complete resolution of the brachial artery lesions.  Over the 035 wire a 9 x 4 Dorado balloon is then advanced across the crushed area of the stent and inflated to 20 atm for 1-1/2 minutes. Following this a 6 mm balloon is advanced across the arterial anastomosis itself and inflated to 12 atm for 1 full minute.  The Kumpe catheter is then advanced over the 035 wire and positioned proximally and then hand injection contrast is now used to demonstrate the brachial artery as well as the graft itself. Brachial artery is well treated without residual stenosis and no evidence of dissection. Arterial anastomosis of the AV graft is now widely patent and the crushed area the stent has now re-profile been appears to be widely patent. Residual thrombus is noted in the venous portion as well as a stricture stenosis noted at the venous outflow. Trerotola device is then used to make multiple passes and subsequently an 8 x 6 Dorado balloon was used and plasty the venous portion through the antegrade sheath the above-noted arterial interventions brachial artery intervention were all done through the retrograde sheath. After an 8 mm and plasty the graft is once again incest in its entirety and noted to be widely patent with forward flow venous outflow was patent and the central veins are patent.    A 4-0 Monocryl purse-string suture was sewn around both of the sheaths.  The sheaths were removed and light pressure was applied.  A sterile bandage was applied to the puncture site.   Interpretation: Initial images demonstrated thrombus throughout the graft. Central veins are widely patent. There are stents placed  from the venous anastomosis to the axillary vein. These were also filled with thrombus. There is also blood for fluoroscopy a crushed area the stent and the arterial portion there is associated with the stricture stenosis of the arterial anastomosis. Upon imaging the arterial system from a very proximal location within the axillary artery 90% stricture stenosis of the arterial inflow was noted. As described above and plasty with Lutonix resolves the arterial inflow lesion. A combination of a 6 and 9 mm balloon resolve the crushed stent and the arterial anastomosis and an 8 mm balloon inflation resolves the venous outflow.  Thrombectomy is inadequate with TPA stand-alone and therefore the Trerotola device is used to complete thrombectomy.   COMPLICATIONS: None  CONDITION: Improved  Katha Cabal, M.D [facility] Office: [phone]  06/03/2015 11:49 AM

## 2015-06-03 NOTE — ED Provider Notes (Signed)
[facility] Emergency Department Provider Note  ____________________________________________  Time seen: Seen upon arrival to the emergency department  I have reviewed the triage vital signs and the nursing notes.   HISTORY  Chief Complaint Vascular Access Problem    HPI [name] is a 80 y.o. male with end-stage renal disease who has a known left dialysis fistula occlusion. He was sent in by his kidney doctor, Dr.Boora, for fistulogram. The patient has no complaints at this time. He was last dialyzed this past Friday but was in the emergency department yesterday with a potassium of 4.6.   Past Medical History  Diagnosis Date  . Acute MI, lateral wall, initial episode of care (HCC)   . ESRD (end stage renal disease) (HCC)     on HD M/W/F  . Diabetes (HCC)   . Non-ST elevation myocardial infarction (NSTEMI), initial care episode (HCC) 06/25/2014    Mild LAD disease, circumflex 90%-->0% with  2.75 x 16 Promus DES, RCA subtotally occluded with left-right collaterals, EF 45-50% by echo    . HTN (hypertension)   . PVD (peripheral vascular disease) [facility])     Patient Active Problem List   Diagnosis Date Noted  . NSTEMI (non-ST elevated myocardial infarction) (HCC) 06/25/2014  . Acute MI, lateral wall, initial episode of care (HCC)   . ESRD (end stage renal disease) [facility])     Past Surgical History  Procedure Laterality Date  . R leg surgery    . Cardiac catheterization  06/25/2014    NSTEMI s/p DES to LCx, subtotally occluded prox RCA  . Left heart catheterization with coronary angiogram N/A 06/25/2014    Procedure: LEFT HEART CATHETERIZATION WITH CORONARY ANGIOGRAM;  Surgeon: Corky Crafts, MD;  Location: [facility] CATH LAB;  Service: Cardiovascular;  Laterality: N/A;  . Percutaneous coronary stent intervention (pci-s)  06/25/2014    Procedure: PERCUTANEOUS CORONARY STENT INTERVENTION (PCI-S);  Surgeon: Corky Crafts, MD;  Location: [facility] CATH  LAB;  Service: Cardiovascular;;    Current Outpatient Rx  Name  Route  Sig  Dispense  Refill  . acetaminophen (TYLENOL) 650 MG CR tablet   Oral   Take 650 mg by mouth every 4 (four) hours as needed for pain or fever.         Marland Kitchen albuterol (PROVENTIL HFA;VENTOLIN HFA) 108 (90 BASE) MCG/ACT inhaler   Inhalation   Inhale 2 puffs into the lungs every 6 (six) hours as needed for wheezing or shortness of breath.         . Amino Acids-Protein Hydrolys (FEEDING SUPPLEMENT, PRO-STAT SUGAR FREE 64,) LIQD   Oral   Take 30 mLs by mouth daily.         Marland Kitchen amLODipine (NORVASC) 2.5 MG tablet   Oral   Take 2.5 mg by mouth daily.         Marland Kitchen aspirin EC 81 MG tablet   Oral   Take 81 mg by mouth daily.         Marland Kitchen b complex-vitamin c-folic acid (NEPHRO-VITE) 0.8 MG TABS tablet   Oral   Take 1 tablet by mouth at bedtime.         . cinacalcet (SENSIPAR) 60 MG tablet   Oral   Take 60 mg by mouth daily.         . ciprofloxacin (CIPRO) 500 MG tablet   Oral   Take 1 tablet (500 mg total) by mouth 2 (two) times daily.   14 tablet   0   .  clopidogrel (PLAVIX) 75 MG tablet   Oral   Take 75 mg by mouth daily.         . ferrous sulfate 325 (65 FE) MG tablet   Oral   Take 325 mg by mouth 2 (two) times daily with a meal.         . glimepiride (AMARYL) 4 MG tablet   Oral   Take 4 mg by mouth daily with breakfast.         . insulin glargine (LANTUS) 100 UNIT/ML injection   Subcutaneous   Inject 11 Units into the skin every morning.         Marland Kitchen losartan (COZAAR) 25 MG tablet   Oral   Take 1 tablet (25 mg total) by mouth daily.   30 tablet   11   . metoprolol (LOPRESSOR) 50 MG tablet   Oral   Take 50 mg by mouth 2 (two) times daily.         . metroNIDAZOLE (FLAGYL) 500 MG tablet   Oral   Take 1 tablet (500 mg total) by mouth 3 (three) times daily.   30 tablet   0   . nitroGLYCERIN (NITROSTAT) 0.6 MG SL tablet   Sublingual   Place 0.6 mg under the tongue every 5  (five) minutes as needed for chest pain.         . Nutritional Supplements (NEPRO) LIQD   Oral   Take 8 oz by mouth 2 (two) times daily.         Marland Kitchen oxyCODONE (OXY IR/ROXICODONE) 5 MG immediate release tablet   Oral   Take 5 mg by mouth every 6 (six) hours as needed (pain).         . pravastatin (PRAVACHOL) 20 MG tablet   Oral   Take 20 mg by mouth at bedtime.            Allergies Contrast media and Fish allergy  Family History  Problem Relation Age of Onset  . Bone cancer Father   . Hypertension Father   . Diabetes Mother   . Blindness Mother     Social History Social History  Substance Use Topics  . Smoking status: Former Smoker    Quit date: 08/30/1956  . Smokeless tobacco: None     Comment: quit 57 yrs ago  . Alcohol Use: No     Comment: occasionally    Review of Systems Constitutional: No fever/chills Eyes: No visual changes. ENT: No sore throat. Cardiovascular: Denies chest pain. Respiratory: Denies shortness of breath. Gastrointestinal: No abdominal pain.  No nausea, no vomiting.  No diarrhea.  No constipation. Genitourinary: Negative for dysuria. Musculoskeletal: Negative for back pain. Skin: Negative for rash. Neurological: Negative for headaches, focal weakness or numbness.  10-point ROS otherwise negative.  ____________________________________________   PHYSICAL EXAM:  VITAL SIGNS: ED Triage Vitals  Enc Vitals Group     BP 06/03/15 0907 134/70 mmHg     Pulse Rate 06/03/15 0907 64     Resp --      Temp 06/03/15 0907 98.4 F (36.9 C)     Temp Source 06/03/15 0907 Oral     SpO2 06/03/15 0907 98 %     Weight 06/03/15 0907 230 lb (104.327 kg)     Height 06/03/15 0907 5\' 9"  (1.753 m)     Head Cir --      Peak Flow --      Pain Score --      Pain Loc --  Pain Edu? --      Excl. in GC? --     Constitutional: Alert and oriented. Well appearing and in no acute distress. Eyes: Conjunctivae are normal. PERRL. EOMI. Head:  Atraumatic. Nose: No congestion/rhinnorhea. Mouth/Throat: Mucous membranes are moist.  Oropharynx non-erythematous. Neck: No stridor.   Cardiovascular: Normal rate, regular rhythm. Grossly normal heart sounds.  Left upper extremity dialysis fistula without thrill or pulse.  Respiratory: Normal respiratory effort.  No retractions. Lungs CTAB. Gastrointestinal: Soft and nontender. No distention. No abdominal bruits. No CVA tenderness. Musculoskeletal: Mild bilateral lower extremity edema which the patient says is at his baseline.  Neurologic:  Normal speech and language. No gross focal neurologic deficits are appreciated. No gait instability. Skin:  Skin is warm, dry and intact. No rash noted. Psychiatric: Mood and affect are normal. Speech and behavior are normal.  ____________________________________________   LABS (all labs ordered are listed, but only abnormal results are displayed)  Labs Reviewed  CBC WITH DIFFERENTIAL/PLATELET  BASIC METABOLIC PANEL  PROTIME-INR  APTT   ____________________________________________  EKG   ____________________________________________  RADIOLOGY   ____________________________________________   PROCEDURES   ____________________________________________   INITIAL IMPRESSION / ASSESSMENT AND PLAN / ED COURSE  Pertinent labs & imaging results that were available during my care of the patient were reviewed by me and considered in my medical decision making (see chart for details).  Discussed case with Dr. Lorretta Harp and says to send the patient to special procedures. He is aware of the patient's issue and has him on the schedule today for intervention. ____________________________________________   FINAL CLINICAL IMPRESSION(S) / ED DIAGNOSES  Clotted fistula. Return visit.    Myrna Blazer, MD 06/03/15 [phone]

## 2015-06-03 NOTE — OR Nursing (Signed)
Report called to [employer] at [facility]. Faxed discharge instruction. Notified that they need to call for follow up appointment

## 2015-06-03 NOTE — ED Notes (Signed)
Pt transported to special procedures area. Denies pain. NAD noted.

## 2015-06-03 NOTE — H&P (Signed)
[facility] Admission History & Physical  MRN : [id number]  [name] is a 80 y.o. (03/19/1935) male who presents with chief complaint of  Chief Complaint  Patient presents with  . Vascular Access Problem  .  History of Present Illness: Patient is well known to Dr. Driscilla Grammes and my service he has end-stage renal disease with a history of multiple access problems. He presented to dialysis yesterday and was found to have a new thrombosis of his AV access. He denies any recent problems with dialysis leading up to this. There is no history or documentation of prolonged bleeding. He denies fever or chills while on dialysis. He denies pain or any hand symptoms ipsilateral to his access.  Current Facility-Administered Medications  Medication Dose Route Frequency Provider Last Rate Last Dose  . 0.9 %  sodium chloride infusion   Intravenous Continuous Renford Dills, MD      . Mitzi Hansen Hold] atropine 0.1 MG/ML injection 0.5 mg  0.5 mg Intravenous Once PRN Renford Dills, MD      . Mitzi Hansen Hold] dextrose 50 % solution 25 mL  0.5 ampule Intravenous Once PRN Renford Dills, MD      . Mitzi Hansen Hold] HYDROmorphone (DILAUDID) injection 1 mg  1 mg Intravenous Q30 min PRN Renford Dills, MD      . Mitzi Hansen Hold] ondansetron [facility]) injection 4 mg  4 mg Intravenous Q30 min PRN Renford Dills, MD        Past Medical History  Diagnosis Date  . Acute MI, lateral wall, initial episode of care (HCC)   . ESRD (end stage renal disease) (HCC)     on HD M/W/F  . Diabetes (HCC)   . Non-ST elevation myocardial infarction (NSTEMI), initial care episode (HCC) 06/25/2014    Mild LAD disease, circumflex 90%-->0% with  2.75 x 16 Promus DES, RCA subtotally occluded with left-right collaterals, EF 45-50% by echo    . HTN (hypertension)   . PVD (peripheral vascular disease) [facility])     Past Surgical History  Procedure Laterality Date  . R leg surgery    . Cardiac catheterization   06/25/2014    NSTEMI s/p DES to LCx, subtotally occluded prox RCA  . Left heart catheterization with coronary angiogram N/A 06/25/2014    Procedure: LEFT HEART CATHETERIZATION WITH CORONARY ANGIOGRAM;  Surgeon: Corky Crafts, MD;  Location: [facility] CATH LAB;  Service: Cardiovascular;  Laterality: N/A;  . Percutaneous coronary stent intervention (pci-s)  06/25/2014    Procedure: PERCUTANEOUS CORONARY STENT INTERVENTION (PCI-S);  Surgeon: Corky Crafts, MD;  Location: [facility] CATH LAB;  Service: Cardiovascular;;    Social History Social History  Substance Use Topics  . Smoking status: Former Smoker    Quit date: 08/30/1956  . Smokeless tobacco: None     Comment: quit 57 yrs ago  . Alcohol Use: No     Comment: occasionally    Family History Family History  Problem Relation Age of Onset  . Bone cancer Father   . Hypertension Father   . Diabetes Mother   . Blindness Mother    no family history of bleeding clotting disorders, autoimmune disease or porphyria  Allergies  Allergen Reactions  . Contrast Media [Iodinated Diagnostic Agents] Hives  . Fish Allergy Hives    Shell fish     REVIEW OF SYSTEMS (Negative unless checked)  Constitutional: [] Weight loss  [] Fever  [] Chills Cardiac: [] Chest pain   [] Chest pressure   [] Palpitations   []   Shortness of breath when laying flat   Shortness of breath at rest   Shortness of breath with exertion. Vascular:  Pain in legs with walking   Pain in legs at rest   Pain in legs when laying flat   Claudication   Pain in feet when walking  Pain in feet at rest  Pain in feet when laying flat   History of DVT   Phlebitis   Swelling in legs   Varicose veins   Non-healing ulcers Pulmonary:   Uses home oxygen   Productive cough   Hemoptysis   Wheeze  COPD   Asthma Neurologic:  Dizziness  Blackouts   Seizures   History of stroke   History of TIA  Aphasia   Temporary blindness   Dysphagia   Weakness  or numbness in arms   Weakness or numbness in legs Musculoskeletal:  Arthritis   Joint swelling   Joint pain   Low back pain Hematologic:  Easy bruising  Easy bleeding   Hypercoagulable state   Anemic  Hepatitis Gastrointestinal:  Blood in stool   Vomiting blood  Gastroesophageal reflux/heartburn   Difficulty swallowing. Genitourinary:  Chronic kidney disease   Difficult urination  Frequent urination  Burning with urination   Blood in urine Skin:  Rashes   Ulcers   Wounds Psychological:  History of anxiety    History of major depression.  Physical Examination  Filed Vitals:   06/03/15 0907  BP: 134/70  Pulse: 64  Temp: 98.4 F (36.9 C)  TempSrc: Oral  Height:  (1.753 m)  Weight: 104.327 kg (230 lb)  SpO2: 98%   Body mass index is 33.95 kg/(m^2). Gen: WD/WN, NAD Head: [place]/AT, No temporalis wasting.  Ear/Nose/Throat: Hearing grossly intact, nares w/o erythema or drainage, oropharynx w/o Erythema/Exudate, Eyes: PERRLA, EOMI.  Neck: Supple, no nuchal rigidity.  No bruit or JVD.  Pulmonary:  Good air movement, clear to auscultation bilaterally, no increased work of respiration or use of accessory muscles  Cardiac: RRR, normal S1, S2, no Murmurs, rubs or gallops. Vascular: AV access no thrill no bruit of note multiple incisions consistent with multiple previous access bilateral upper extremities Vessel Right Left  Radial  trace Palpable  trace Palpable  Ulnar  not Palpable  not Palpable  Brachial Palpable Palpable  Carotid Palpable, without bruit Palpable, without bruit  Aorta Not palpable N/A  Femoral  not Palpable  not Palpable  Popliteal  not Palpable  not Palpable  PT  not Palpable  not Palpable  DP  not Palpable  not Palpable   Gastrointestinal: soft, non-tender/non-distended. No guarding/reflex. No masses, surgical incisions, or scars. Musculoskeletal: M/S 5/5 throughout.  No deformity or atrophy. Neurologic: CN 2-12  intact. Pain and light touch intact in extremities.  Symmetrical.  Speech is fluent. Motor exam as listed above. Psychiatric: Judgment intact, Mood & affect appropriate for pt's clinical situation. Dermatologic: No rashes or ulcers noted.  No cellulitis or open wounds. Lymph : No Cervical, Axillary, or Inguinal lymphadenopathy.    CBC Lab Results  Component Value Date   WBC 8.7 06/02/2015   HGB 11.5* 06/02/2015   HCT 34.8* 06/02/2015   MCV 90.3 06/02/2015   PLT 193 06/02/2015    BMET    Component Value Date/Time   NA 134* 06/02/2015 1816   NA 139 06/25/2014 0734   K 4.6 06/02/2015 1816   K 4.4 06/25/2014 0734   CL 91* 06/02/2015 1816   CL 95* 06/25/2014 0734   CO2  30 06/02/2015 1816   CO2 33* 06/25/2014 0734   GLUCOSE 100* 06/02/2015 1816   GLUCOSE 140* 06/25/2014 0734   BUN 47* 06/02/2015 1816   BUN 20* 06/25/2014 0734   CREATININE 9.36* 06/02/2015 1816   CREATININE 4.53* 06/25/2014 0734   CALCIUM 8.0* 06/02/2015 1816   CALCIUM 8.3* 06/25/2014 0734   GFRNONAA 5* 06/02/2015 1816   GFRNONAA 13* 06/25/2014 0734   GFRNONAA 10* 12/26/2013 0550   GFRAA 5* 06/02/2015 1816   GFRAA 16* 06/25/2014 0734   GFRAA 11* 12/26/2013 0550   Estimated Creatinine Clearance: 7.7 mL/min (by C-G formula based on Cr of 9.36).  COAG Lab Results  Component Value Date   INR 1.11 06/02/2015    Radiology No results found.  Assessment/Plan 1.  Complication dialysis device with thrombosis AV access:  Patient's left brachial axillary dialysis graft is thrombosed. He will undergo thrombectomy using interventional techniques. Potassium will be drawn to ensure that it is an appropriate level prior to performing thrombectomy. 2.  End-stage renal disease requiring hemodialysis:  Patient will continue his dialysis therapy without further interruption if a successful thrombectomy is not achieved then catheter will be placed. Dialysis has artery been arranged to see is missed yesterday. 3.   Hypertension:  Patient will continue his medical management nephrology is following no changes in his oral medications. 4.  Coronary artery disease:  EKG will be monitored. Nitrates will be used if needed. His oral cardiac medications will be continued. 5.  Diabetes mellitus:  Glucose will be monitored oral medications been held this morning once the patient has undergone his procedure his by mouth intake will be reinitiated and again Accu-Cheks will be used and he will be restarted on his usual hypoglycemic regime   Schnier, Latina Craver, MD  06/03/2015 10:02 AM

## 2015-06-03 NOTE — ED Notes (Signed)
Pt via ems from kidney center. His fistula was clotted yesterday and he was brought here and sent home with no resolution. Pt was sent over here by his nephrologist for direct admit.

## 2015-06-04 ENCOUNTER — Encounter: Payer: Self-pay | Admitting: Vascular Surgery

## 2015-08-05 ENCOUNTER — Other Ambulatory Visit: Payer: Self-pay | Admitting: Vascular Surgery

## 2015-08-07 ENCOUNTER — Ambulatory Visit
Admission: RE | Admit: 2015-08-07 | Discharge: 2015-08-07 | Disposition: A | Discharge status: Skilled Nursing Facility | Payer: Medicare Other | Source: Ambulatory Visit | Attending: Vascular Surgery | Admitting: Vascular Surgery

## 2015-08-07 ENCOUNTER — Encounter: Payer: Self-pay | Admitting: Vascular Surgery

## 2015-08-07 ENCOUNTER — Encounter
Admission: RE | Disposition: A | Discharge status: Skilled Nursing Facility | Payer: Self-pay | Source: Ambulatory Visit | Attending: Vascular Surgery

## 2015-08-07 DIAGNOSIS — I120 Hypertensive chronic kidney disease with stage 5 chronic kidney disease or end stage renal disease: Secondary | ICD-10-CM | POA: Diagnosis not present

## 2015-08-07 DIAGNOSIS — E1122 Type 2 diabetes mellitus with diabetic chronic kidney disease: Secondary | ICD-10-CM | POA: Insufficient documentation

## 2015-08-07 DIAGNOSIS — N186 End stage renal disease: Secondary | ICD-10-CM | POA: Diagnosis not present

## 2015-08-07 DIAGNOSIS — E785 Hyperlipidemia, unspecified: Secondary | ICD-10-CM | POA: Insufficient documentation

## 2015-08-07 DIAGNOSIS — I70212 Atherosclerosis of native arteries of extremities with intermittent claudication, left leg: Secondary | ICD-10-CM | POA: Insufficient documentation

## 2015-08-07 DIAGNOSIS — E669 Obesity, unspecified: Secondary | ICD-10-CM | POA: Diagnosis not present

## 2015-08-07 DIAGNOSIS — Z794 Long term (current) use of insulin: Secondary | ICD-10-CM | POA: Insufficient documentation

## 2015-08-07 DIAGNOSIS — Z7982 Long term (current) use of aspirin: Secondary | ICD-10-CM | POA: Diagnosis not present

## 2015-08-07 DIAGNOSIS — L97429 Non-pressure chronic ulcer of left heel and midfoot with unspecified severity: Secondary | ICD-10-CM | POA: Diagnosis not present

## 2015-08-07 DIAGNOSIS — Z87891 Personal history of nicotine dependence: Secondary | ICD-10-CM | POA: Insufficient documentation

## 2015-08-07 DIAGNOSIS — J449 Chronic obstructive pulmonary disease, unspecified: Secondary | ICD-10-CM | POA: Diagnosis not present

## 2015-08-07 DIAGNOSIS — Z79899 Other long term (current) drug therapy: Secondary | ICD-10-CM | POA: Diagnosis not present

## 2015-08-07 DIAGNOSIS — L97209 Non-pressure chronic ulcer of unspecified calf with unspecified severity: Secondary | ICD-10-CM | POA: Insufficient documentation

## 2015-08-07 DIAGNOSIS — Z6833 Body mass index (BMI) 33.0-33.9, adult: Secondary | ICD-10-CM | POA: Diagnosis not present

## 2015-08-07 DIAGNOSIS — Z7902 Long term (current) use of antithrombotics/antiplatelets: Secondary | ICD-10-CM | POA: Insufficient documentation

## 2015-08-07 DIAGNOSIS — L97309 Non-pressure chronic ulcer of unspecified ankle with unspecified severity: Secondary | ICD-10-CM | POA: Insufficient documentation

## 2015-08-07 HISTORY — PX: PERIPHERAL VASCULAR CATHETERIZATION: SHX172C

## 2015-08-07 LAB — PROTIME-INR
INR: 1.2
Prothrombin Time: 15.4 seconds — ABNORMAL HIGH (ref 11.4–15.0)

## 2015-08-07 LAB — GLUCOSE, CAPILLARY: GLUCOSE-CAPILLARY: 130 mg/dL — AB (ref 65–99)

## 2015-08-07 SURGERY — ABDOMINAL AORTOGRAM W/LOWER EXTREMITY
Wound class: Clean

## 2015-08-07 MED ORDER — HYDROMORPHONE HCL 1 MG/ML IJ SOLN: 1.0000 mg | Freq: Once | INTRAMUSCULAR | Status: DC

## 2015-08-07 MED ORDER — HYDROMORPHONE HCL 1 MG/ML IJ SOLN: 0.5000 mg | INTRAMUSCULAR | Status: DC | PRN

## 2015-08-07 MED ORDER — FAMOTIDINE 20 MG PO TABS
ORAL_TABLET | ORAL | Status: AC
  Filled 2015-08-07: qty 1

## 2015-08-07 MED ORDER — HEPARIN SODIUM (PORCINE) 1000 UNIT/ML IJ SOLN
INTRAMUSCULAR | Status: DC | PRN
  Administered 2015-08-07: 4000 [IU] via INTRAVENOUS

## 2015-08-07 MED ORDER — FENTANYL CITRATE (PF) 100 MCG/2ML IJ SOLN
INTRAMUSCULAR | Status: DC | PRN
  Administered 2015-08-07 (×2): 50 ug via INTRAVENOUS

## 2015-08-07 MED ORDER — FAMOTIDINE 20 MG PO TABS: 40.0000 mg | ORAL_TABLET | ORAL | Status: DC | PRN

## 2015-08-07 MED ORDER — ACETAMINOPHEN 325 MG PO TABS: 325.0000 mg | ORAL_TABLET | ORAL | Status: DC | PRN

## 2015-08-07 MED ORDER — DEXTROSE 5 % IV SOLN
1.5000 g | INTRAVENOUS | Status: AC
  Administered 2015-08-07: 1.5 g via INTRAVENOUS
  Filled 2015-08-07: qty 1.5

## 2015-08-07 MED ORDER — IOHEXOL 300 MG/ML  SOLN
INTRAMUSCULAR | Status: DC | PRN
Start: 2015-08-07 — End: 2015-08-07
  Administered 2015-08-07: 100 mL via INTRA_ARTERIAL

## 2015-08-07 MED ORDER — FAMOTIDINE 20 MG PO TABS: 20.0000 mg | ORAL_TABLET | Freq: Once | ORAL | Status: DC

## 2015-08-07 MED ORDER — LIDOCAINE-EPINEPHRINE (PF) 1 %-1:200000 IJ SOLN
INTRAMUSCULAR | Status: DC | PRN
  Administered 2015-08-07: 10 mL via INTRADERMAL

## 2015-08-07 MED ORDER — MIDAZOLAM HCL 5 MG/5ML IJ SOLN
INTRAMUSCULAR | Status: AC
  Filled 2015-08-07: qty 5

## 2015-08-07 MED ORDER — HEPARIN SODIUM (PORCINE) 1000 UNIT/ML IJ SOLN
INTRAMUSCULAR | Status: AC
  Filled 2015-08-07: qty 1

## 2015-08-07 MED ORDER — ONDANSETRON HCL 4 MG/2ML IJ SOLN: 4.0000 mg | Freq: Four times a day (QID) | INTRAMUSCULAR | Status: DC | PRN

## 2015-08-07 MED ORDER — HEPARIN (PORCINE) IN NACL 2-0.9 UNIT/ML-% IJ SOLN
INTRAMUSCULAR | Status: AC
  Filled 2015-08-07: qty 1000

## 2015-08-07 MED ORDER — FENTANYL CITRATE (PF) 100 MCG/2ML IJ SOLN
INTRAMUSCULAR | Status: AC
  Filled 2015-08-07: qty 2

## 2015-08-07 MED ORDER — METOPROLOL TARTRATE 1 MG/ML IV SOLN: 2.0000 mg | INTRAVENOUS | Status: DC | PRN

## 2015-08-07 MED ORDER — SODIUM CHLORIDE 0.9 % IV SOLN: 500.0000 mL | Freq: Once | INTRAVENOUS | Status: DC | PRN

## 2015-08-07 MED ORDER — METHYLPREDNISOLONE SODIUM SUCC 125 MG IJ SOLR
INTRAMUSCULAR | Status: AC
  Filled 2015-08-07: qty 2

## 2015-08-07 MED ORDER — ACETAMINOPHEN 325 MG RE SUPP: 325.0000 mg | RECTAL | Status: DC | PRN

## 2015-08-07 MED ORDER — PHENOL 1.4 % MT LIQD: 1.0000 | OROMUCOSAL | Status: DC | PRN

## 2015-08-07 MED ORDER — METHYLPREDNISOLONE SODIUM SUCC 125 MG IJ SOLR
125.0000 mg | INTRAMUSCULAR | Status: DC | PRN
  Administered 2015-08-07: 125 mg via INTRAVENOUS

## 2015-08-07 MED ORDER — MIDAZOLAM HCL 2 MG/2ML IJ SOLN
INTRAMUSCULAR | Status: DC | PRN
  Administered 2015-08-07: 1 mg via INTRAVENOUS
  Administered 2015-08-07: 2 mg via INTRAVENOUS

## 2015-08-07 MED ORDER — OXYCODONE-ACETAMINOPHEN 5-325 MG PO TABS: 1.0000 | ORAL_TABLET | ORAL | Status: DC | PRN

## 2015-08-07 MED ORDER — LIDOCAINE HCL (PF) 1 % IJ SOLN
INTRAMUSCULAR | Status: AC
Start: 2015-08-07 — End: 2015-08-07
  Filled 2015-08-07: qty 30

## 2015-08-07 MED ORDER — LABETALOL HCL 5 MG/ML IV SOLN: 10.0000 mg | INTRAVENOUS | Status: DC | PRN

## 2015-08-07 MED ORDER — SODIUM CHLORIDE 0.9 % IV SOLN
INTRAVENOUS | Status: DC
  Administered 2015-08-07: 10:00:00 via INTRAVENOUS

## 2015-08-07 MED ORDER — GUAIFENESIN-DM 100-10 MG/5ML PO SYRP: 15.0000 mL | ORAL_SOLUTION | ORAL | Status: DC | PRN

## 2015-08-07 MED ORDER — HYDRALAZINE HCL 20 MG/ML IJ SOLN: 5.0000 mg | INTRAMUSCULAR | Status: DC | PRN

## 2015-08-07 SURGICAL SUPPLY — 22 items
BALLN LUTONIX DCB 5X100X130 (BALLOONS) ×5
BALLN LUTONIX DCB 6X100X130 (BALLOONS) ×5
BALLN ULTRVRSE 2.5X300X150 (BALLOONS) ×5
BALLN ULTRVRSE 3X150X150 (BALLOONS) ×5
BALLN ULTRVRSE 7X40X130C (BALLOONS) ×5
BALLOON LUTONIX DCB 5X100X130 (BALLOONS) ×3 IMPLANT
BALLOON LUTONIX DCB 6X100X130 (BALLOONS) ×3 IMPLANT
BALLOON ULTRVRSE 2.5X300X150 (BALLOONS) ×3 IMPLANT
BALLOON ULTRVRSE 3X150X150 (BALLOONS) ×3 IMPLANT
BALLOON ULTRVRSE 7X40X130C (BALLOONS) ×3 IMPLANT
CATH PIG 70CM (CATHETERS) ×5 IMPLANT
CATH VERT 100CM (CATHETERS) ×5 IMPLANT
DEVICE PRESTO INFLATION (MISCELLANEOUS) ×5 IMPLANT
DEVICE STARCLOSE SE CLOSURE (Vascular Products) ×5 IMPLANT
GLIDEWIRE ADV .035X260CM (WIRE) ×5 IMPLANT
PACK ANGIOGRAPHY (CUSTOM PROCEDURE TRAY) ×5 IMPLANT
SHEATH ANL2 6FRX45 HC (SHEATH) ×5 IMPLANT
SHEATH BRITE TIP 5FRX11 (SHEATH) ×5 IMPLANT
SYR MEDRAD MARK V 150ML (SYRINGE) ×5 IMPLANT
TUBING CONTRAST HIGH PRESS 72 (TUBING) ×5 IMPLANT
WIRE G V18X300CM (WIRE) ×5 IMPLANT
WIRE J 3MM .035X145CM (WIRE) ×5 IMPLANT

## 2015-08-07 NOTE — Discharge Instructions (Signed)

## 2015-08-07 NOTE — Op Note (Signed)
[place] [facility] Percutaneous Study/Intervention Procedural Note   Date of Surgery: 08/07/2015  Surgeon(s):Esmeralda Blanford   Assistants:none  Pre-operative Diagnosis: PAD with ulceration LLE  Post-operative diagnosis: Same  Procedure(s) Performed: 1. Ultrasound guidance for vascular access right femoral artery 2. Catheter placement into left anterior tibial artery from right femoral approach 3. Aortogram and selective left lower extremity angiogram 4. Percutaneous transluminal angioplasty of entire left anterior tibial artery with 2.5 mm diameter by 30 cm length angioplasty balloon used twice, and a 3 mm diameter by 15 cm length angioplasty balloon used in the midportion 5. Percutaneous transluminal angioplasty of left popliteal artery with 5 mm diameter by 10 cm length Lutonix drug-coated angioplasty balloon  6.  Percutaneous transluminal angioplasty of left common femoral artery and proximal superficial femoral artery with 6 mm diameter by 10 cm length Lutonix drug-coated angioplasty balloon as well as a 7 mm diameter conventional angioplasty balloon to the common femoral artery as a secondary treatment 7. StarClose closure device right femoral artery  EBL: 25 cc  Contrast: 105 cc  Indications: Patient is45 year old male with ESRD and PAD and a non-healing ulcer on the left heel.  The patient has noninvasive study showing a drop in his ABIs and nearly flat digital pressure on the left. The patient is brought in for angiography for further evaluation and potential treatment. Risks and benefits are discussed and informed consent is obtained  Procedure: The patient was identified and appropriate procedural time out was performed. The patient was then placed supine on the table and prepped and draped in the usual sterile fashion. Ultrasound was used to evaluate the right common  femoral artery. It was patent but calcified. A digital ultrasound image was acquired. A Seldinger needle was used to access the right common femoral artery under direct ultrasound guidance and a permanent image was performed. A 0.035 J wire was advanced without resistance and a 5Fr sheath was placed. Pigtail catheter was placed into the aorta and an AP aortogram was performed. This demonstrated normal renal arteries and normal aorta and iliac segments without significant stenosis. There was about a 25-30% stenosis at the distal edge of the previous left CIA stent. I then crossed the aortic bifurcation and advanced to the left femoral head. Selective left lower extremity angiogram was then performed. This demonstrated about an 80% stenosis of the common femoral artery tracking down into the proximal superficial femoral artery. The mid SFA had a mild stenosis of less than 30%. The popliteal artery had a lesion in the 50-60% range at just above the level of the knee. The patient then had two-vessel runoff but multiple areas of high-grade stenosis within the anterior tibial artery particularly in the mid to distal segments. The patient was systemically heparinized and a 6 [place] Ansell sheath was then placed over the [employer] wire. I then used a Kumpe catheter and the advantage wire to navigate through the common femoral and popliteal lesions and confirm intraluminal flow in the popliteal artery. I then exchanged for a 0.018 wire and crossed the anterior tibial lesions and parked the wire into the foot. I used a 2.5 mm diameter by 30 cm length angioplasty balloon inflated from the foot up to the origin of the anterior tibial artery. 2 inflations were necessary and it was inflated to 10 atm distally and 12 atm proximally. In the mid section, a 3 mm diameter by 15 cm length angioplasty balloon was also necessary in the anterior tibial artery. Following this, no greater  than 30% residual stenosis remained  until the foot. I then treated the popliteal stenosis with a 5 mm diameter by 10 cm length Lutonix drug-coated angioplasty balloon inflated to 12 atm for 1 minute. Completion and gram showed only about a 20% residual stenosis. I then turned my attention to the common femoral artery and the proximal superficial femoral artery. A 6 mm diameter by 10 cm length Lutonix drug-coated angioplasty balloon was used to treat this lesion. This was inflated to 14 atm for 1 minute. I then upsized to a 7 mm diameter by 4 cm length conventional angioplasty balloon from the top of the femoral head to the mid to distal common femoral artery. This was inflated to 10 atm for 1 minute. Completion angiogram following this showed about a 30% residual stenosis that was not flow limiting a markedly improved. At this point, I elected to terminate the procedure. The sheath was removed and StarClose closure device was deployed in the left femoral artery with excellent hemostatic result. The patient was taken to the recovery room in stable condition having tolerated the procedure well.  Findings:  Aortogram: Aorta calcified but patent. Right iliac system patent. Left iliac stent patent with mild stenosis of about 20-30% at the distal edge of the stent. Left Lower Extremity: Proximal 80% left common femoral lesion extending into the proximal SFA, 30% mid SFA stenosis, 50-60% popliteal artery stenosis, two-vessel runoff with multiple areas of greater than 80% stenosis in the anterior tibial artery.   Disposition: Patient was taken to the recovery room in stable condition having tolerated the procedure well.  Complications: None  Birdella Sippel 08/07/2015 11:36 AM

## 2015-08-07 NOTE — H&P (Signed)
  [place] [facility] History & Physical Update  The patient was interviewed and re-examined.  The patient's previous History and Physical has been reviewed and is unchanged.  There is no change in the plan of care. We plan to proceed with the scheduled procedure.  DEW,JASON, MD  08/07/2015, 9:44 AM

## 2015-08-07 NOTE — OR Nursing (Signed)
CBG 130 

## 2015-08-12 ENCOUNTER — Other Ambulatory Visit: Payer: Self-pay | Admitting: Vascular Surgery

## 2015-08-14 ENCOUNTER — Encounter
Admission: RE | Disposition: A | Discharge status: Skilled Nursing Facility | Payer: Self-pay | Source: Ambulatory Visit | Attending: Vascular Surgery

## 2015-08-14 ENCOUNTER — Encounter: Payer: Self-pay | Admitting: *Deleted

## 2015-08-14 ENCOUNTER — Ambulatory Visit
Admission: RE | Admit: 2015-08-14 | Discharge: 2015-08-14 | Disposition: A | Discharge status: Skilled Nursing Facility | Payer: Medicare Other | Source: Ambulatory Visit | Attending: Vascular Surgery | Admitting: Vascular Surgery

## 2015-08-14 DIAGNOSIS — R0989 Other specified symptoms and signs involving the circulatory and respiratory systems: Secondary | ICD-10-CM | POA: Diagnosis not present

## 2015-08-14 DIAGNOSIS — T7849XA Other allergy, initial encounter: Secondary | ICD-10-CM | POA: Diagnosis not present

## 2015-08-14 DIAGNOSIS — J449 Chronic obstructive pulmonary disease, unspecified: Secondary | ICD-10-CM | POA: Insufficient documentation

## 2015-08-14 DIAGNOSIS — Y832 Surgical operation with anastomosis, bypass or graft as the cause of abnormal reaction of the patient, or of later complication, without mention of misadventure at the time of the procedure: Secondary | ICD-10-CM | POA: Insufficient documentation

## 2015-08-14 DIAGNOSIS — L97229 Non-pressure chronic ulcer of left calf with unspecified severity: Secondary | ICD-10-CM | POA: Insufficient documentation

## 2015-08-14 DIAGNOSIS — T82858A Stenosis of vascular prosthetic devices, implants and grafts, initial encounter: Secondary | ICD-10-CM | POA: Diagnosis present

## 2015-08-14 DIAGNOSIS — N186 End stage renal disease: Secondary | ICD-10-CM | POA: Insufficient documentation

## 2015-08-14 DIAGNOSIS — I10 Essential (primary) hypertension: Secondary | ICD-10-CM | POA: Insufficient documentation

## 2015-08-14 DIAGNOSIS — E119 Type 2 diabetes mellitus without complications: Secondary | ICD-10-CM | POA: Insufficient documentation

## 2015-08-14 DIAGNOSIS — Z79899 Other long term (current) drug therapy: Secondary | ICD-10-CM | POA: Insufficient documentation

## 2015-08-14 DIAGNOSIS — E669 Obesity, unspecified: Secondary | ICD-10-CM | POA: Diagnosis not present

## 2015-08-14 DIAGNOSIS — Z992 Dependence on renal dialysis: Secondary | ICD-10-CM | POA: Diagnosis not present

## 2015-08-14 DIAGNOSIS — E785 Hyperlipidemia, unspecified: Secondary | ICD-10-CM | POA: Diagnosis not present

## 2015-08-14 DIAGNOSIS — Z6833 Body mass index (BMI) 33.0-33.9, adult: Secondary | ICD-10-CM | POA: Insufficient documentation

## 2015-08-14 DIAGNOSIS — Z87891 Personal history of nicotine dependence: Secondary | ICD-10-CM | POA: Insufficient documentation

## 2015-08-14 DIAGNOSIS — Z7982 Long term (current) use of aspirin: Secondary | ICD-10-CM | POA: Insufficient documentation

## 2015-08-14 HISTORY — PX: PERIPHERAL VASCULAR CATHETERIZATION: SHX172C

## 2015-08-14 LAB — POTASSIUM (ARMC VASCULAR LAB ONLY): POTASSIUM (ARMC VASCULAR LAB): 3.8 (ref 3.5–5.1)

## 2015-08-14 SURGERY — A/V SHUNTOGRAM/FISTULAGRAM
Anesthesia: Moderate Sedation

## 2015-08-14 MED ORDER — FAMOTIDINE 20 MG PO TABS: 40.0000 mg | ORAL_TABLET | ORAL | Status: DC | PRN

## 2015-08-14 MED ORDER — ONDANSETRON HCL 4 MG/2ML IJ SOLN: 4.0000 mg | Freq: Four times a day (QID) | INTRAMUSCULAR | Status: DC | PRN

## 2015-08-14 MED ORDER — HEPARIN SODIUM (PORCINE) 1000 UNIT/ML IJ SOLN
INTRAMUSCULAR | Status: AC
  Filled 2015-08-14: qty 1

## 2015-08-14 MED ORDER — FENTANYL CITRATE (PF) 100 MCG/2ML IJ SOLN
INTRAMUSCULAR | Status: AC
  Filled 2015-08-14: qty 2

## 2015-08-14 MED ORDER — HYDROMORPHONE HCL 1 MG/ML IJ SOLN: 1.0000 mg | Freq: Once | INTRAMUSCULAR | Status: DC

## 2015-08-14 MED ORDER — LIDOCAINE-EPINEPHRINE (PF) 1 %-1:200000 IJ SOLN
INTRAMUSCULAR | Status: AC
  Filled 2015-08-14: qty 30

## 2015-08-14 MED ORDER — MIDAZOLAM HCL 2 MG/2ML IJ SOLN
INTRAMUSCULAR | Status: DC | PRN
  Administered 2015-08-14: 2 mg via INTRAVENOUS
  Administered 2015-08-14: 1 mg via INTRAVENOUS

## 2015-08-14 MED ORDER — MIDAZOLAM HCL 5 MG/5ML IJ SOLN
INTRAMUSCULAR | Status: AC
  Filled 2015-08-14: qty 5

## 2015-08-14 MED ORDER — HEPARIN (PORCINE) IN NACL 2-0.9 UNIT/ML-% IJ SOLN
INTRAMUSCULAR | Status: AC
  Filled 2015-08-14: qty 1000

## 2015-08-14 MED ORDER — METHYLPREDNISOLONE SODIUM SUCC 125 MG IJ SOLR
125.0000 mg | INTRAMUSCULAR | Status: DC | PRN
  Administered 2015-08-14: 125 mg via INTRAVENOUS

## 2015-08-14 MED ORDER — FAMOTIDINE 20 MG PO TABS
20.0000 mg | ORAL_TABLET | Freq: Once | ORAL | Status: AC
  Administered 2015-08-14: 20 mg via ORAL

## 2015-08-14 MED ORDER — IOHEXOL 300 MG/ML  SOLN
INTRAMUSCULAR | Status: DC | PRN
  Administered 2015-08-14: 30 mL via INTRA_ARTERIAL

## 2015-08-14 MED ORDER — FAMOTIDINE 20 MG PO TABS
ORAL_TABLET | ORAL | Status: AC
  Administered 2015-08-14: 20 mg via ORAL
  Filled 2015-08-14: qty 1

## 2015-08-14 MED ORDER — DEXTROSE 5 % IV SOLN
1.5000 g | INTRAVENOUS | Status: AC
  Administered 2015-08-14: 1.5 g via INTRAVENOUS

## 2015-08-14 MED ORDER — FENTANYL CITRATE (PF) 100 MCG/2ML IJ SOLN
INTRAMUSCULAR | Status: DC | PRN
  Administered 2015-08-14 (×2): 50 ug via INTRAVENOUS

## 2015-08-14 MED ORDER — LIDOCAINE-EPINEPHRINE (PF) 1 %-1:200000 IJ SOLN
INTRAMUSCULAR | Status: DC | PRN
  Administered 2015-08-14: 10 mL via INTRADERMAL

## 2015-08-14 MED ORDER — METHYLPREDNISOLONE SODIUM SUCC 125 MG IJ SOLR
INTRAMUSCULAR | Status: AC
Start: 2015-08-14 — End: 2015-08-14
  Administered 2015-08-14: 125 mg via INTRAVENOUS
  Filled 2015-08-14: qty 2

## 2015-08-14 MED ORDER — HEPARIN SODIUM (PORCINE) 1000 UNIT/ML IJ SOLN
INTRAMUSCULAR | Status: DC | PRN
Start: 2015-08-14 — End: 2015-08-14
  Administered 2015-08-14: 3000 [IU] via INTRAVENOUS

## 2015-08-14 MED ORDER — SODIUM CHLORIDE 0.9 % IV SOLN
INTRAVENOUS | Status: DC
  Administered 2015-08-14: 09:00:00 via INTRAVENOUS

## 2015-08-14 SURGICAL SUPPLY — 11 items
BALLN LUTONIX DCB 6X80X130 (BALLOONS) ×2
BALLOON LUTONIX DCB 6X80X130 (BALLOONS) ×1 IMPLANT
CANNULA 5F STIFF (CANNULA) ×2 IMPLANT
CATH TORCON 5FR 0.38 (CATHETERS) ×2 IMPLANT
DEVICE PRESTO INFLATION (MISCELLANEOUS) ×2 IMPLANT
DRAPE BRACHIAL (DRAPES) ×2 IMPLANT
GLIDEWIRE STIFF .35X180X3 HYDR (WIRE) ×2 IMPLANT
PACK ANGIOGRAPHY (CUSTOM PROCEDURE TRAY) ×2 IMPLANT
SHEATH BRITE TIP 6FRX5.5 (SHEATH) ×2 IMPLANT
TOWEL OR 17X26 4PK STRL BLUE (TOWEL DISPOSABLE) ×2 IMPLANT
WIRE MAGIC TORQUE 260C (WIRE) ×2 IMPLANT

## 2015-08-14 NOTE — Discharge Instructions (Signed)
Fistulogram, Care After °Refer to this sheet in the next few weeks. These instructions provide you with information on caring for yourself after your procedure. Your health care provider may also give you more specific instructions. Your treatment has been planned according to current medical practices, but problems sometimes occur. Call your health care provider if you have any problems or questions after your procedure. °WHAT TO EXPECT AFTER THE PROCEDURE °After your procedure, it is typical to have the following: °· A small amount of discomfort in the area where the catheters were placed. °· A small amount of bruising around the fistula. °· Sleepiness and fatigue. °HOME CARE INSTRUCTIONS °· Rest at home for the day following your procedure. °· Do not drive or operate heavy machinery while taking pain medicine. °· Take medicines only as directed by your health care provider. °· Do not take baths, swim, or use a hot tub until your health care provider approves. You may shower 24 hours after the procedure or as directed by your health care provider. °· There are many different ways to close and cover an incision, including stitches, skin glue, and adhesive strips. Follow your health care provider's instructions on: °¨ Incision care. °¨ Bandage (dressing) changes and removal. °¨ Incision closure removal. °· Monitor your dialysis fistula carefully. °SEEK MEDICAL CARE IF: °· You have drainage, redness, swelling, or pain at your catheter site. °· You have a fever. °· You have chills. °SEEK IMMEDIATE MEDICAL CARE IF: °· You feel weak. °· You have trouble balancing. °· You have trouble moving your arms or legs. °· You have problems with your speech or vision. °· You can no longer feel a vibration or buzz when you put your fingers over your dialysis fistula. °· The limb that was used for the procedure: °¨ Swells. °¨ Is painful. °¨ Is cold. °¨ Is discolored, such as blue or pale white. °  °This information is not intended  to replace advice given to you by your health care provider. Make sure you discuss any questions you have with your health care provider. °  °Document Released: 12/31/2013 Document Reviewed: 12/31/2013 °Elsevier Interactive Patient Education ©2016 Elsevier Inc. ° °

## 2015-08-14 NOTE — Op Note (Signed)
[place] [facility]    OPERATIVE NOTE   PROCEDURE: 1.  Left brachial artery to axillary vein arteriovenous graft cannulation under ultrasound guidance 2.  Left arm shuntogram 3.  Catheter placement into brachial artery from a retrograde approach from the graft and left upper extremity angiogram 4.  Percutaneous transluminal angioplasty of the left radial/brachial artery with 6 mm diameter by 8 cm length Lutonix drug-coated angioplasty balloon  PRE-OPERATIVE DIAGNOSIS: 1. ESRD 2. Malfunctioning left brachial artery to axillary vein arteriovenous graft  POST-OPERATIVE DIAGNOSIS: same as above   SURGEON: Leotis Pain, MD  ANESTHESIA: local with MCS  ESTIMATED BLOOD LOSS: 25 cc  FINDING(S): 1. Stenosis of the inflow artery proximal to the graft. Multiple areas of mild stenosis of less than 40% within the graft and the axillary vein  SPECIMEN(S):  None  CONTRAST: 30 cc  INDICATIONS: [name] is a 80 y.o. male who presents with malfunctioning left brachial artery to axillary vein arteriovenous graft with low flows at dialysis.  The patient is scheduled for left arm shuntogram.  The patient is aware the risks include but are not limited to: bleeding, infection, thrombosis of the cannulated access, and possible anaphylactic reaction to the contrast.  The patient is aware of the risks of the procedure and elects to proceed forward.  DESCRIPTION: After full informed written consent was obtained, the patient was brought back to the angiography suite and placed supine upon the angiography table.  The patient was connected to monitoring equipment.  The  left arm was prepped and draped in the standard fashion for a percutaneous access intervention.  Under ultrasound guidance, the midportion of the left brachial artery to axillary vein arteriovenous graft was cannulated with a micropuncture needle under direct ultrasound guidance in an antegrade fashion and a permanent image  was performed.  The microwire was advanced into the fistula and the needle was exchanged for the a microsheath. Imaging performed through the micro-sheath showed multiple areas of mild narrowing within the graft and within the stents including the axillary vein outflow. None of these were more than 40%. The central venous circulation appeared patent. With compression of the graft, there appeared to be stenosis in the artery proximal to the graft and a configuration that may have been a high brachial bifurcation with the graft actually being off of the radial artery above the antecubital fossa. I then removed the sheath and used a 4-0 Monocryl pursestring suture. This access site, I used a micropuncture needle to cannulate the graft in a retrograde fashion to be able to gain access to the arterial circuit for further imaging. This was done under direct ultrasound guidance and a micropuncture wire and sheath were placed.  I then upsized to a 6 Fr Sheath and advanced a Glidewire and Kumpe catheter into the artery fed the graft that was likely the radial artery and above its bifurcation into the brachial artery in the proximal upper arm and imaging was performed.  Hand injections were performed. This demonstrated what appeared to be a high brachial bifurcation with the ulnar artery going downstream to feed the hand. The radial artery had 2 areas of stenosis about 3-4 cm apart well proximal to the graft itself. The more severe the 2 stenosis was about 75% and the lesser of the 2 stenosis was about 60%. These were several centimeters proximal to the graft itself. Beyond the graft, this artery did not demonstrate significant flow distally.  Based on the images, this patient will  need intervention to these areas, but I felt they could be treated with one balloon inflation given their close proximity. I then gave the patient 3000 units of intravenous heparin.  I then crossed the stenosis with a Magic Tourqe wire.  Based on  the imaging, a 6 mm x 8 cm  Lutonix drug-coated angioplasty balloon was selected.  The balloon was centered around the stenosis and inflated to 14 ATM for 1 minute(s).  On completion imaging, a 10-15 % residual stenosis was present with brisk flow through the graft and maintained flow in the other (ulnar) artery down the arm.  Based on the completion imaging, no further intervention is necessary.  The wire and balloon were removed from the sheath.  A 4-0 Monocryl purse-string suture was sewn around the sheath.  The sheath was removed while tying down the suture.  A sterile bandage was applied to the puncture site.  COMPLICATIONS: None  CONDITION: Stable   DEW,JASON  08/14/2015 9:09 AM

## 2015-08-14 NOTE — H&P (Signed)
  [place] [facility] History & Physical Update  The patient was interviewed and re-examined.  The patient's previous History and Physical has been reviewed and is unchanged.  There is no change in the plan of care. We plan to proceed with the scheduled procedure.  DEW,JASON, MD  08/14/2015, 8:22 AM

## 2015-08-14 NOTE — Progress Notes (Signed)
Pt doing well post procedure, eating sandwich without difficulty. Discharge instructions given with questions answered,with rtc appt. Alert and oriented

## 2015-08-26 ENCOUNTER — Emergency Department: Payer: Medicare Other

## 2015-08-26 ENCOUNTER — Inpatient Hospital Stay
Admission: EM | Admit: 2015-08-26 | Discharge: 2015-09-01 | DRG: 853 | Disposition: A | Discharge status: Skilled Nursing Facility | Payer: Medicare Other | Attending: Internal Medicine | Admitting: Internal Medicine

## 2015-08-26 ENCOUNTER — Encounter: Payer: Self-pay | Admitting: Emergency Medicine

## 2015-08-26 DIAGNOSIS — Z794 Long term (current) use of insulin: Secondary | ICD-10-CM | POA: Diagnosis not present

## 2015-08-26 DIAGNOSIS — I739 Peripheral vascular disease, unspecified: Secondary | ICD-10-CM | POA: Diagnosis present

## 2015-08-26 DIAGNOSIS — I96 Gangrene, not elsewhere classified: Secondary | ICD-10-CM | POA: Diagnosis present

## 2015-08-26 DIAGNOSIS — E1152 Type 2 diabetes mellitus with diabetic peripheral angiopathy with gangrene: Secondary | ICD-10-CM | POA: Diagnosis present

## 2015-08-26 DIAGNOSIS — E1140 Type 2 diabetes mellitus with diabetic neuropathy, unspecified: Secondary | ICD-10-CM | POA: Diagnosis present

## 2015-08-26 DIAGNOSIS — Z7902 Long term (current) use of antithrombotics/antiplatelets: Secondary | ICD-10-CM | POA: Diagnosis not present

## 2015-08-26 DIAGNOSIS — A419 Sepsis, unspecified organism: Secondary | ICD-10-CM | POA: Diagnosis present

## 2015-08-26 DIAGNOSIS — Z833 Family history of diabetes mellitus: Secondary | ICD-10-CM

## 2015-08-26 DIAGNOSIS — N2581 Secondary hyperparathyroidism of renal origin: Secondary | ICD-10-CM | POA: Diagnosis present

## 2015-08-26 DIAGNOSIS — E119 Type 2 diabetes mellitus without complications: Secondary | ICD-10-CM

## 2015-08-26 DIAGNOSIS — Z821 Family history of blindness and visual loss: Secondary | ICD-10-CM

## 2015-08-26 DIAGNOSIS — L97319 Non-pressure chronic ulcer of right ankle with unspecified severity: Secondary | ICD-10-CM | POA: Diagnosis present

## 2015-08-26 DIAGNOSIS — I252 Old myocardial infarction: Secondary | ICD-10-CM

## 2015-08-26 DIAGNOSIS — I120 Hypertensive chronic kidney disease with stage 5 chronic kidney disease or end stage renal disease: Secondary | ICD-10-CM | POA: Diagnosis present

## 2015-08-26 DIAGNOSIS — K920 Hematemesis: Secondary | ICD-10-CM | POA: Diagnosis not present

## 2015-08-26 DIAGNOSIS — Z79899 Other long term (current) drug therapy: Secondary | ICD-10-CM | POA: Diagnosis not present

## 2015-08-26 DIAGNOSIS — R471 Dysarthria and anarthria: Secondary | ICD-10-CM | POA: Diagnosis present

## 2015-08-26 DIAGNOSIS — Z993 Dependence on wheelchair: Secondary | ICD-10-CM

## 2015-08-26 DIAGNOSIS — Z8249 Family history of ischemic heart disease and other diseases of the circulatory system: Secondary | ICD-10-CM | POA: Diagnosis not present

## 2015-08-26 DIAGNOSIS — N186 End stage renal disease: Secondary | ICD-10-CM

## 2015-08-26 DIAGNOSIS — L02612 Cutaneous abscess of left foot: Secondary | ICD-10-CM

## 2015-08-26 DIAGNOSIS — K219 Gastro-esophageal reflux disease without esophagitis: Secondary | ICD-10-CM | POA: Diagnosis present

## 2015-08-26 DIAGNOSIS — A4159 Other Gram-negative sepsis: Principal | ICD-10-CM | POA: Diagnosis present

## 2015-08-26 DIAGNOSIS — R531 Weakness: Secondary | ICD-10-CM

## 2015-08-26 DIAGNOSIS — Z808 Family history of malignant neoplasm of other organs or systems: Secondary | ICD-10-CM | POA: Diagnosis not present

## 2015-08-26 DIAGNOSIS — L97503 Non-pressure chronic ulcer of other part of unspecified foot with necrosis of muscle: Secondary | ICD-10-CM

## 2015-08-26 DIAGNOSIS — Z7982 Long term (current) use of aspirin: Secondary | ICD-10-CM | POA: Diagnosis not present

## 2015-08-26 DIAGNOSIS — Z955 Presence of coronary angioplasty implant and graft: Secondary | ICD-10-CM

## 2015-08-26 DIAGNOSIS — D631 Anemia in chronic kidney disease: Secondary | ICD-10-CM | POA: Diagnosis present

## 2015-08-26 DIAGNOSIS — L97519 Non-pressure chronic ulcer of other part of right foot with unspecified severity: Secondary | ICD-10-CM

## 2015-08-26 DIAGNOSIS — Z992 Dependence on renal dialysis: Secondary | ICD-10-CM | POA: Diagnosis not present

## 2015-08-26 DIAGNOSIS — E11621 Type 2 diabetes mellitus with foot ulcer: Secondary | ICD-10-CM | POA: Diagnosis present

## 2015-08-26 DIAGNOSIS — E1122 Type 2 diabetes mellitus with diabetic chronic kidney disease: Secondary | ICD-10-CM | POA: Diagnosis present

## 2015-08-26 DIAGNOSIS — I2510 Atherosclerotic heart disease of native coronary artery without angina pectoris: Secondary | ICD-10-CM | POA: Diagnosis present

## 2015-08-26 HISTORY — DX: Gastro-esophageal reflux disease without esophagitis: K21.9

## 2015-08-26 HISTORY — DX: End stage renal disease: Z99.2

## 2015-08-26 HISTORY — DX: End stage renal disease: N18.6

## 2015-08-26 LAB — COMPREHENSIVE METABOLIC PANEL
ALT: 14 U/L — ABNORMAL LOW (ref 17–63)
ANION GAP: 13 (ref 5–15)
AST: 19 U/L (ref 15–41)
Albumin: 2.6 g/dL — ABNORMAL LOW (ref 3.5–5.0)
Alkaline Phosphatase: 151 U/L — ABNORMAL HIGH (ref 38–126)
BUN: 31 mg/dL — AB (ref 6–20)
CHLORIDE: 92 mmol/L — AB (ref 101–111)
CO2: 31 mmol/L (ref 22–32)
Calcium: 8.3 mg/dL — ABNORMAL LOW (ref 8.9–10.3)
Creatinine, Ser: 6.21 mg/dL — ABNORMAL HIGH (ref 0.61–1.24)
GFR calc Af Amer: 9 mL/min — ABNORMAL LOW (ref >60)
GFR calc non Af Amer: 8 mL/min — ABNORMAL LOW (ref >60)
GLUCOSE: 229 mg/dL — AB (ref 65–99)
POTASSIUM: 4.5 mmol/L (ref 3.5–5.1)
Sodium: 136 mmol/L (ref 135–145)
Total Bilirubin: 0.8 mg/dL (ref 0.3–1.2)
Total Protein: 8.2 g/dL — ABNORMAL HIGH (ref 6.5–8.1)

## 2015-08-26 LAB — CBC WITH DIFFERENTIAL/PLATELET
BASOS ABS: 0.1 10*3/uL (ref 0–0.1)
Basophils Relative: 0 %
EOS PCT: 0 %
Eosinophils Absolute: 0 10*3/uL (ref 0–0.7)
HEMATOCRIT: 32.3 % — AB (ref 40.0–52.0)
Hemoglobin: 10.3 g/dL — ABNORMAL LOW (ref 13.0–18.0)
LYMPHS ABS: 1.3 10*3/uL (ref 1.0–3.6)
LYMPHS PCT: 9 %
MCH: 28.4 pg (ref 26.0–34.0)
MCHC: 32.1 g/dL (ref 32.0–36.0)
MCV: 88.6 fL (ref 80.0–100.0)
MONO ABS: 1.2 10*3/uL — AB (ref 0.2–1.0)
Monocytes Relative: 9 %
NEUTROS ABS: 11.7 10*3/uL — AB (ref 1.4–6.5)
Neutrophils Relative %: 82 %
PLATELETS: 268 10*3/uL (ref 150–440)
RBC: 3.64 MIL/uL — AB (ref 4.40–5.90)
RDW: 15.6 % — AB (ref 11.5–14.5)
WBC: 14.2 10*3/uL — ABNORMAL HIGH (ref 3.8–10.6)

## 2015-08-26 LAB — LACTIC ACID, PLASMA
Lactic Acid, Venous: 2 mmol/L (ref 0.5–2.0)
Lactic Acid, Venous: 1.6 mmol/L (ref 0.5–2.0)

## 2015-08-26 LAB — GLUCOSE, CAPILLARY: GLUCOSE-CAPILLARY: 211 mg/dL — AB (ref 65–99)

## 2015-08-26 MED ORDER — ONDANSETRON HCL 4 MG/2ML IJ SOLN: 4.0000 mg | Freq: Four times a day (QID) | INTRAMUSCULAR | Status: DC | PRN

## 2015-08-26 MED ORDER — METOPROLOL TARTRATE 25 MG PO TABS
12.5000 mg | ORAL_TABLET | Freq: Two times a day (BID) | ORAL | Status: DC
  Administered 2015-08-27 – 2015-09-01 (×10): 12.5 mg via ORAL
  Filled 2015-08-26 (×12): qty 1

## 2015-08-26 MED ORDER — ACETAMINOPHEN 650 MG RE SUPP
650.0000 mg | Freq: Four times a day (QID) | RECTAL | Status: DC | PRN
Start: 2015-08-26 — End: 2015-09-01

## 2015-08-26 MED ORDER — ACETAMINOPHEN 325 MG PO TABS: 650.0000 mg | ORAL_TABLET | Freq: Four times a day (QID) | ORAL | Status: DC | PRN

## 2015-08-26 MED ORDER — LANTHANUM CARBONATE 500 MG PO CHEW
500.0000 mg | CHEWABLE_TABLET | Freq: Three times a day (TID) | ORAL | Status: DC
  Administered 2015-08-27 – 2015-09-01 (×10): 500 mg via ORAL
  Filled 2015-08-26 (×17): qty 1

## 2015-08-26 MED ORDER — PANTOPRAZOLE SODIUM 40 MG PO TBEC
40.0000 mg | DELAYED_RELEASE_TABLET | Freq: Every day | ORAL | Status: DC
  Administered 2015-08-27 – 2015-08-28 (×2): 40 mg via ORAL
  Filled 2015-08-26 (×2): qty 1

## 2015-08-26 MED ORDER — ACETAMINOPHEN 325 MG PO TABS
650.0000 mg | ORAL_TABLET | Freq: Once | ORAL | Status: AC
  Administered 2015-08-26: 650 mg via ORAL
  Filled 2015-08-26: qty 2

## 2015-08-26 MED ORDER — IPRATROPIUM-ALBUTEROL 20-100 MCG/ACT IN AERS: 2.0000 | INHALATION_SPRAY | Freq: Four times a day (QID) | RESPIRATORY_TRACT | Status: DC | PRN

## 2015-08-26 MED ORDER — ONDANSETRON HCL 4 MG PO TABS: 4.0000 mg | ORAL_TABLET | Freq: Four times a day (QID) | ORAL | Status: DC | PRN

## 2015-08-26 MED ORDER — INSULIN ASPART 100 UNIT/ML ~~LOC~~ SOLN
0.0000 [IU] | Freq: Four times a day (QID) | SUBCUTANEOUS | Status: DC
  Administered 2015-08-27 (×2): 1 [IU] via SUBCUTANEOUS
  Administered 2015-08-27: 3 [IU] via SUBCUTANEOUS
  Administered 2015-08-27: 1 [IU] via SUBCUTANEOUS
  Administered 2015-08-27: 3 [IU] via SUBCUTANEOUS
  Administered 2015-08-28 (×3): 1 [IU] via SUBCUTANEOUS
  Administered 2015-08-28: 2 [IU] via SUBCUTANEOUS
  Filled 2015-08-26: qty 3
  Filled 2015-08-26 (×2): qty 1
  Filled 2015-08-26: qty 3
  Filled 2015-08-26: qty 2
  Filled 2015-08-26 (×4): qty 1

## 2015-08-26 MED ORDER — ASPIRIN EC 81 MG PO TBEC
81.0000 mg | DELAYED_RELEASE_TABLET | Freq: Every day | ORAL | Status: DC
  Administered 2015-08-27 – 2015-08-28 (×2): 81 mg via ORAL
  Filled 2015-08-26 (×2): qty 1

## 2015-08-26 MED ORDER — LOSARTAN POTASSIUM 25 MG PO TABS
12.5000 mg | ORAL_TABLET | Freq: Every day | ORAL | Status: DC
  Administered 2015-08-27 – 2015-09-01 (×4): 12.5 mg via ORAL
  Filled 2015-08-26 (×5): qty 1

## 2015-08-26 MED ORDER — PIPERACILLIN-TAZOBACTAM 3.375 G IVPB
3.3750 g | Freq: Once | INTRAVENOUS | Status: AC
  Administered 2015-08-26: 3.375 g via INTRAVENOUS
  Filled 2015-08-26: qty 50

## 2015-08-26 MED ORDER — ALBUTEROL SULFATE (2.5 MG/3ML) 0.083% IN NEBU: 3.0000 mL | INHALATION_SOLUTION | Freq: Four times a day (QID) | RESPIRATORY_TRACT | Status: DC | PRN

## 2015-08-26 MED ORDER — IPRATROPIUM-ALBUTEROL 0.5-2.5 (3) MG/3ML IN SOLN: 3.0000 mL | Freq: Four times a day (QID) | RESPIRATORY_TRACT | Status: DC | PRN

## 2015-08-26 MED ORDER — ACETAMINOPHEN 325 MG PO TABS
ORAL_TABLET | ORAL | Status: AC
  Administered 2015-08-26: 650 mg via ORAL
  Filled 2015-08-26: qty 1

## 2015-08-26 MED ORDER — SODIUM CHLORIDE 0.9 % IJ SOLN
3.0000 mL | Freq: Two times a day (BID) | INTRAMUSCULAR | Status: DC
  Administered 2015-08-27 – 2015-08-31 (×6): 3 mL via INTRAVENOUS

## 2015-08-26 MED ORDER — CINACALCET HCL 30 MG PO TABS
90.0000 mg | ORAL_TABLET | Freq: Every day | ORAL | Status: DC
  Administered 2015-08-27 – 2015-09-01 (×6): 90 mg via ORAL
  Filled 2015-08-26 (×6): qty 3

## 2015-08-26 MED ORDER — SEVELAMER CARBONATE 2.4 G PO PACK
2.4000 g | PACK | Freq: Three times a day (TID) | ORAL | Status: DC
  Administered 2015-08-27 – 2015-09-01 (×10): 2.4 g via ORAL
  Filled 2015-08-26 (×15): qty 1

## 2015-08-26 MED ORDER — CLOPIDOGREL BISULFATE 75 MG PO TABS
75.0000 mg | ORAL_TABLET | Freq: Every day | ORAL | Status: DC
  Administered 2015-08-27: 75 mg via ORAL
  Filled 2015-08-26: qty 1

## 2015-08-26 MED ORDER — SODIUM CHLORIDE 0.9 % IV SOLN
INTRAVENOUS | Status: AC
  Administered 2015-08-27: via INTRAVENOUS

## 2015-08-26 MED ORDER — OXYCODONE HCL 5 MG PO TABS: 5.0000 mg | ORAL_TABLET | Freq: Four times a day (QID) | ORAL | Status: DC | PRN

## 2015-08-26 MED ORDER — HEPARIN SODIUM (PORCINE) 5000 UNIT/ML IJ SOLN
5000.0000 [IU] | Freq: Three times a day (TID) | INTRAMUSCULAR | Status: DC
  Administered 2015-08-27 – 2015-08-30 (×10): 5000 [IU] via SUBCUTANEOUS
  Filled 2015-08-26 (×11): qty 1

## 2015-08-26 NOTE — ED Notes (Signed)
Right foot dressing removed.  Large black eschar area to left heel.  Tissue soft to palpation on heel.  Area foul smelling.

## 2015-08-26 NOTE — ED Notes (Signed)
Arrives from [facility] after patient reportedly had a fever of 101.3 tody.  Tylenol was offered to patient and patient refused tylenol (according to EMS).  Patient sent to ED for evaluation.

## 2015-08-26 NOTE — H&P (Signed)
[facility] - [place] at [facility]   PATIENT NAME: [name]    MR#:  [id number]  DATE OF BIRTH:  01-02-1935  DATE OF ADMISSION:  08/26/2015  PRIMARY CARE PHYSICIAN: No PCP Per Patient   REQUESTING/REFERRING PHYSICIAN: Huel Cote, MD  CHIEF COMPLAINT:   Chief Complaint  Patient presents with  . Fever    HISTORY OF PRESENT ILLNESS:  [name]  is a 80 y.o. male who presents with fever at his nursing facility to 101.3. Patient was sent to the ED for evaluation. Here he was noted to be borderline febrile, tachycardic, with an elevated white blood cell count. Exam revealed a left heel ulceration with some drainage and eschar. Patient has known peripheral vascular disease, and her family members are with him here today he recently had lower extremity stents placed. Hospitalists were called for admission for sepsis.  PAST MEDICAL HISTORY:   Past Medical History  Diagnosis Date  . Acute MI, lateral wall, initial episode of care (HCC)   . ESRD on dialysis [facility])     on HD M/W/F  . Diabetes (HCC)   . CAD (coronary artery disease) 06/25/2014    Mild LAD disease, circumflex 90%-->0% with  2.75 x 16 Promus DES, RCA subtotally occluded with left-right collaterals, EF 45-50% by echo    . HTN (hypertension)   . PVD (peripheral vascular disease) (HCC)   . GERD (gastroesophageal reflux disease)     PAST SURGICAL HISTORY:   Past Surgical History  Procedure Laterality Date  . R leg surgery    . Cardiac catheterization  06/25/2014    NSTEMI s/p DES to LCx, subtotally occluded prox RCA  . Left heart catheterization with coronary angiogram N/A 06/25/2014    Procedure: LEFT HEART CATHETERIZATION WITH CORONARY ANGIOGRAM;  Surgeon: Corky Crafts, MD;  Location: [facility] CATH LAB;  Service: Cardiovascular;  Laterality: N/A;  . Percutaneous coronary stent intervention (pci-s)  06/25/2014    Procedure: PERCUTANEOUS CORONARY STENT INTERVENTION (PCI-S);  Surgeon:  Corky Crafts, MD;  Location: [facility] CATH LAB;  Service: Cardiovascular;;  . Peripheral vascular catheterization N/A 06/03/2015    Procedure: A/V Shunt Intervention;  Surgeon: Renford Dills, MD;  Location: [facility] INVASIVE CV LAB;  Service: Cardiovascular;  Laterality: N/A;  . Peripheral vascular catheterization Left 06/03/2015    Procedure: A/V Shuntogram/Fistulagram;  Surgeon: Renford Dills, MD;  Location: [facility] INVASIVE CV LAB;  Service: Cardiovascular;  Laterality: Left;  . Peripheral vascular catheterization N/A 08/07/2015    Procedure: Abdominal Aortogram w/Lower Extremity;  Surgeon: Annice Needy, MD;  Location: [facility] INVASIVE CV LAB;  Service: Cardiovascular;  Laterality: N/A;  . Peripheral vascular catheterization  08/07/2015    Procedure: Lower Extremity Intervention;  Surgeon: Annice Needy, MD;  Location: [facility] INVASIVE CV LAB;  Service: Cardiovascular;;  . Peripheral vascular catheterization N/A 08/14/2015    Procedure: A/V Shuntogram/Fistulagram;  Surgeon: Annice Needy, MD;  Location: [facility] INVASIVE CV LAB;  Service: Cardiovascular;  Laterality: N/A;    SOCIAL HISTORY:   Social History  Substance Use Topics  . Smoking status: Former Smoker    Quit date: 08/30/1956  . Smokeless tobacco: Not on file     Comment: quit 57 yrs ago  . Alcohol Use: No     Comment: occasionally    FAMILY HISTORY:   Family History  Problem Relation Age of Onset  . Bone cancer Father   . Hypertension Father   . Diabetes Mother   . Blindness Mother  DRUG ALLERGIES:   Allergies  Allergen Reactions  . Contrast Media [Iodinated Diagnostic Agents] Hives  . Fish Allergy Hives    Shell fish    MEDICATIONS AT HOME:   Prior to Admission medications   Medication Sig Start Date End Date Taking? Authorizing Provider  acetaminophen (TYLENOL) 325 MG tablet Take 650 mg by mouth 3 (three) times daily.    Historical Provider, MD  albuterol (PROVENTIL HFA;VENTOLIN HFA) 108 (90 BASE) MCG/ACT inhaler  Inhale 2 puffs into the lungs every 6 (six) hours as needed for wheezing or shortness of breath.    Historical Provider, MD  aspirin EC 81 MG tablet Take 81 mg by mouth daily.    Historical Provider, MD  b complex-vitamin c-folic acid (NEPHRO-VITE) 0.8 MG TABS tablet Take 1 tablet by mouth daily.     Historical Provider, MD  calcium elemental as carbonate (TUMS ULTRA 1000) 400 MG chewable tablet Chew 3,000 mg by mouth 3 (three) times daily with meals.    Historical Provider, MD  cinacalcet (SENSIPAR) 90 MG tablet Take 90 mg by mouth daily.    Historical Provider, MD  ciprofloxacin (CIPRO) 500 MG tablet Take 1 tablet (500 mg total) by mouth 2 (two) times daily. 01/15/15   Sharman Cheek, MD  clopidogrel (PLAVIX) 75 MG tablet Take 75 mg by mouth daily.    Historical Provider, MD  ferrous sulfate 325 (65 FE) MG tablet Take 325 mg by mouth 2 (two) times daily with a meal.    Historical Provider, MD  furosemide (LASIX) 20 MG tablet Take 20 mg by mouth daily.    Historical Provider, MD  gabapentin (NEURONTIN) 100 MG capsule Take 100 mg by mouth 2 (two) times daily.    Historical Provider, MD  insulin glargine (LANTUS) 100 UNIT/ML injection Inject 8 Units into the skin every morning.     Historical Provider, MD  Ipratropium-Albuterol (COMBIVENT RESPIMAT) 20-100 MCG/ACT AERS respimat Inhale 2 puffs into the lungs every 6 (six) hours as needed for wheezing or shortness of breath.    Historical Provider, MD  isosorbide mononitrate (IMDUR) 30 MG 24 hr tablet Take 30 mg by mouth daily.    Historical Provider, MD  lanthanum (FOSRENOL) 1000 MG chewable tablet Chew 500 mg by mouth 3 (three) times daily with meals.    Historical Provider, MD  losartan (COZAAR) 25 MG tablet Take 1 tablet (25 mg total) by mouth daily. Patient taking differently: Take 12.5 mg by mouth daily.  06/28/14   Rhonda G Barrett, PA-C  metoprolol tartrate (LOPRESSOR) 25 MG tablet Take 12.5 mg by mouth 2 (two) times daily.    Historical  Provider, MD  metroNIDAZOLE (FLAGYL) 500 MG tablet Take 1 tablet (500 mg total) by mouth 3 (three) times daily. Patient not taking: Reported on 06/03/2015 01/15/15   Sharman Cheek, MD  nitroGLYCERIN (NITROSTAT) 0.6 MG SL tablet Place 0.6 mg under the tongue every 5 (five) minutes as needed for chest pain.    Historical Provider, MD  Nutritional Supplements (NEPRO) LIQD Take 8 oz by mouth 2 (two) times daily.    Historical Provider, MD  omeprazole (PRILOSEC OTC) 20 MG tablet Take 20 mg by mouth daily.    Historical Provider, MD  oxyCODONE (OXY IR/ROXICODONE) 5 MG immediate release tablet Take 5 mg by mouth every 6 (six) hours as needed (pain).    Historical Provider, MD  sevelamer carbonate (RENVELA) 2.4 G PACK Take 2.4 g by mouth 3 (three) times daily with meals.    Historical  Provider, MD  sitaGLIPtin (JANUVIA) 25 MG tablet Take 25 mg by mouth daily.    Historical Provider, MD    REVIEW OF SYSTEMS:  Review of Systems  Constitutional: Positive for fever and chills. Negative for weight loss and malaise/fatigue.  HENT: Negative for ear pain, hearing loss and tinnitus.   Eyes: Negative for blurred vision, double vision, pain and redness.  Respiratory: Negative for cough, hemoptysis and shortness of breath.   Cardiovascular: Negative for chest pain, palpitations, orthopnea and leg swelling.  Gastrointestinal: Negative for nausea, vomiting, abdominal pain, diarrhea and constipation.  Genitourinary: Negative for dysuria, frequency and hematuria.  Musculoskeletal: Negative for back pain, joint pain and neck pain.       Left heel wound and eschar  Skin:       No acne, rash, or lesions  Neurological: Negative for dizziness, tremors, focal weakness and weakness.  Endo/Heme/Allergies: Negative for polydipsia. Does not bruise/bleed easily.  Psychiatric/Behavioral: Negative for depression. The patient is not nervous/anxious and does not have insomnia.      VITAL SIGNS:   Filed Vitals:   08/26/15  1951 08/26/15 2112  BP: 134/102 124/100  Pulse: 103 68  Temp: 100.3 F (37.9 C) 99.7 F (37.6 C)  TempSrc: Oral Oral  Resp: 18   Height: 5\' 11"  (1.803 m)   Weight: 108.863 kg (240 lb)   SpO2: 100% 98%   Wt Readings from Last 3 Encounters:  08/26/15 108.863 kg (240 lb)  08/14/15 103.42 kg (228 lb)  08/07/15 103.42 kg (228 lb)    PHYSICAL EXAMINATION:  Physical Exam  Vitals reviewed. Constitutional: He is oriented to person, place, and time. He appears well-developed and well-nourished. No distress.  Patient has some mild active rigors on exam  HENT:  Head: Normocephalic and atraumatic.  Mouth/Throat: Oropharynx is clear and moist.  Eyes: Conjunctivae and EOM are normal. Pupils are equal, round, and reactive to light. No scleral icterus.  Neck: Normal range of motion. Neck supple. No JVD present. No thyromegaly present.  Cardiovascular: Regular rhythm and intact distal pulses.  Exam reveals no gallop and no friction rub.   No murmur heard. Borderline tachycardic  Respiratory: Effort normal and breath sounds normal. No respiratory distress. He has no wheezes. He has no rales.  GI: Soft. Bowel sounds are normal. He exhibits no distension. There is no tenderness.  Musculoskeletal: Normal range of motion. He exhibits no edema.  No arthritis, no gout. Left heel ulcer, malodorous and draining some purulent material, with associated surrounding eschar.  Lymphadenopathy:    He has no cervical adenopathy.  Neurological: He is alert and oriented to person, place, and time. No cranial nerve deficit.  No dysarthria, no aphasia  Skin: Skin is warm and dry. No rash noted.  Left heel wound as above  Psychiatric: He has a normal mood and affect. His behavior is normal. Judgment and thought content normal.    LABORATORY PANEL:   CBC  Recent Labs Lab 08/26/15 1915  WBC 14.2*  HGB 10.3*  HCT 32.3*  PLT 268    ------------------------------------------------------------------------------------------------------------------  Chemistries   Recent Labs Lab 08/26/15 1915  NA 136  K 4.5  CL 92*  CO2 31  GLUCOSE 229*  BUN 31*  CREATININE 6.21*  CALCIUM 8.3*  AST 19  ALT 14*  ALKPHOS 151*  BILITOT 0.8   ------------------------------------------------------------------------------------------------------------------  Cardiac Enzymes No results for input(s): TROPONINI in the last 168 hours. ------------------------------------------------------------------------------------------------------------------  RADIOLOGY:  Dg Chest 2 View  08/26/2015  CLINICAL DATA:  Fever EXAM: CHEST - 2 VIEW COMPARISON:  06/25/2014 FINDINGS: Cardiac shadow is within normal limits. The thoracic aorta is tortuous. The lungs are incompletely aerated. Multiple vascular stents are noted in the left arm. Elevation the right hemidiaphragm is again seen. Diffuse interstitial changes are noted without focal infiltrate. Degenerative change of the thoracic spine is noted. IMPRESSION: Chronic changes without acute abnormality. Electronically Signed   By: Alcide CleverMark  Lukens M.D.   On: 08/26/2015 19:50    EKG:   Orders placed or performed during the hospital encounter of 06/02/15  . ED EKG  . ED EKG  . EKG 12-Lead  . EKG 12-Lead  . EKG    IMPRESSION AND PLAN:  Principal Problem:   Sepsis (HCC) - blood cultures sent from the ED. IV antibiotics started, will use vancomycin and Zosyn. Lactic acid was within normal limits initially, and on repeat. Patient is hemodynamically stable. Active Problems:   Gangrene of foot (HCC) - source of patient's sepsis. We will use IV antibiotics tonight, podiatry consult placed for possible debridement/surgical treatment.   ESRD (end stage renal disease) on dialysis [facility](HCC) - nephrology consult for hemodialysis support. Renally dose medications.   CAD (coronary artery disease) - stable,  continue home meds   Type 2 diabetes mellitus (HCC) - sliding scale insulin and appropriate glucose checks every 6 hours while patient is nothing by mouth.   GERD (gastroesophageal reflux disease) - equivalent home dose PPI  All the records are reviewed and case discussed with ED provider. Management plans discussed with the patient and/or family.  DVT PROPHYLAXIS: SubQ heparin  GI PROPHYLAXIS: PPI  ADMISSION STATUS: Inpatient  CODE STATUS: Full  TOTAL TIME TAKING CARE OF THIS PATIENT: 40 minutes.    [name] 08/26/2015, 10:15 PM  [facility]  Office  [phone]  CC: Primary care physician; No PCP Per Patient

## 2015-08-27 ENCOUNTER — Inpatient Hospital Stay: Payer: Medicare Other

## 2015-08-27 LAB — GLUCOSE, CAPILLARY
GLUCOSE-CAPILLARY: 204 mg/dL — AB (ref 65–99)
GLUCOSE-CAPILLARY: 138 mg/dL — AB (ref 65–99)
Glucose-Capillary: 122 mg/dL — ABNORMAL HIGH (ref 65–99)
Glucose-Capillary: 129 mg/dL — ABNORMAL HIGH (ref 65–99)
Glucose-Capillary: 122 mg/dL — ABNORMAL HIGH (ref 65–99)

## 2015-08-27 LAB — BASIC METABOLIC PANEL
Anion gap: 14 (ref 5–15)
BUN: 36 mg/dL — AB (ref 6–20)
CO2: 31 mmol/L (ref 22–32)
CREATININE: 7.07 mg/dL — AB (ref 0.61–1.24)
Calcium: 8.5 mg/dL — ABNORMAL LOW (ref 8.9–10.3)
Chloride: 94 mmol/L — ABNORMAL LOW (ref 101–111)
GFR calc Af Amer: 8 mL/min — ABNORMAL LOW (ref >60)
GFR, EST NON AFRICAN AMERICAN: 7 mL/min — AB (ref >60)
GLUCOSE: 159 mg/dL — AB (ref 65–99)
POTASSIUM: 4.3 mmol/L (ref 3.5–5.1)
Sodium: 139 mmol/L (ref 135–145)

## 2015-08-27 LAB — BLOOD CULTURE ID PANEL (REFLEXED)
ACINETOBACTER BAUMANNII: NOT DETECTED
CANDIDA PARAPSILOSIS: NOT DETECTED
CANDIDA TROPICALIS: NOT DETECTED
Candida albicans: NOT DETECTED
Candida glabrata: NOT DETECTED
Candida krusei: NOT DETECTED
Carbapenem resistance: NOT DETECTED
ENTEROCOCCUS SPECIES: NOT DETECTED
Enterobacter cloacae complex: NOT DETECTED
Enterobacteriaceae species: DETECTED — AB
Escherichia coli: NOT DETECTED
HAEMOPHILUS INFLUENZAE: NOT DETECTED
KLEBSIELLA OXYTOCA: NOT DETECTED
KLEBSIELLA PNEUMONIAE: NOT DETECTED
LISTERIA MONOCYTOGENES: NOT DETECTED
METHICILLIN RESISTANCE: NOT DETECTED
Neisseria meningitidis: NOT DETECTED
Proteus species: DETECTED — AB
Pseudomonas aeruginosa: NOT DETECTED
SERRATIA MARCESCENS: NOT DETECTED
Staphylococcus aureus (BCID): NOT DETECTED
Staphylococcus species: NOT DETECTED
Streptococcus agalactiae: NOT DETECTED
Streptococcus pneumoniae: NOT DETECTED
Streptococcus pyogenes: NOT DETECTED
Streptococcus species: NOT DETECTED
VANCOMYCIN RESISTANCE: NOT DETECTED

## 2015-08-27 LAB — HEMOGLOBIN A1C: HEMOGLOBIN A1C: 6.9 % — AB (ref 4.0–6.0)

## 2015-08-27 LAB — CBC
HCT: 29.7 % — ABNORMAL LOW (ref 40.0–52.0)
Hemoglobin: 9.8 g/dL — ABNORMAL LOW (ref 13.0–18.0)
MCH: 28.6 pg (ref 26.0–34.0)
MCHC: 33.1 g/dL (ref 32.0–36.0)
MCV: 86.5 fL (ref 80.0–100.0)
Platelets: 249 K/uL (ref 150–440)
RBC: 3.43 MIL/uL — ABNORMAL LOW (ref 4.40–5.90)
RDW: 15.1 % — ABNORMAL HIGH (ref 11.5–14.5)
WBC: 10.9 K/uL — ABNORMAL HIGH (ref 3.8–10.6)

## 2015-08-27 LAB — PROTIME-INR
INR: 1.34
Prothrombin Time: 16.7 seconds — ABNORMAL HIGH (ref 11.4–15.0)

## 2015-08-27 LAB — MRSA PCR SCREENING: MRSA BY PCR: NEGATIVE

## 2015-08-27 LAB — PHOSPHORUS: Phosphorus: 4.2 mg/dL (ref 2.5–4.6)

## 2015-08-27 MED ORDER — LIDOCAINE-PRILOCAINE 2.5-2.5 % EX CREA
1.0000 | TOPICAL_CREAM | CUTANEOUS | Status: DC | PRN
Start: 2015-08-27 — End: 2015-09-01
  Filled 2015-08-27: qty 5

## 2015-08-27 MED ORDER — VANCOMYCIN HCL IN DEXTROSE 1-5 GM/200ML-% IV SOLN
1000.0000 mg | INTRAVENOUS | Status: DC
  Administered 2015-08-27 – 2015-08-29 (×2): 1000 mg via INTRAVENOUS
  Filled 2015-08-27 (×2): qty 200

## 2015-08-27 MED ORDER — PIPERACILLIN-TAZOBACTAM 3.375 G IVPB
3.3750 g | Freq: Two times a day (BID) | INTRAVENOUS | Status: DC
  Administered 2015-08-27 – 2015-09-01 (×10): 3.375 g via INTRAVENOUS
  Filled 2015-08-27 (×13): qty 50

## 2015-08-27 MED ORDER — ALTEPLASE 2 MG IJ SOLR
2.0000 mg | Freq: Once | INTRAMUSCULAR | Status: DC | PRN
  Filled 2015-08-27: qty 2

## 2015-08-27 MED ORDER — LIDOCAINE HCL (PF) 1 % IJ SOLN
5.0000 mL | INTRAMUSCULAR | Status: DC | PRN
  Filled 2015-08-27: qty 5

## 2015-08-27 MED ORDER — VANCOMYCIN HCL 10 G IV SOLR
1750.0000 mg | Freq: Once | INTRAVENOUS | Status: AC
  Administered 2015-08-27: 1750 mg via INTRAVENOUS
  Filled 2015-08-27: qty 1750

## 2015-08-27 MED ORDER — PENTAFLUOROPROP-TETRAFLUOROETH EX AERO
1.0000 | INHALATION_SPRAY | CUTANEOUS | Status: DC | PRN
Start: 2015-08-27 — End: 2015-09-01
  Filled 2015-08-27: qty 30

## 2015-08-27 MED ORDER — SODIUM CHLORIDE 0.9 % IV SOLN: 100.0000 mL | INTRAVENOUS | Status: DC | PRN

## 2015-08-27 MED ORDER — CETYLPYRIDINIUM CHLORIDE 0.05 % MT LIQD
7.0000 mL | Freq: Two times a day (BID) | OROMUCOSAL | Status: DC
  Administered 2015-08-27 – 2015-08-31 (×10): 7 mL via OROMUCOSAL

## 2015-08-27 MED ORDER — CHLORHEXIDINE GLUCONATE 0.12 % MT SOLN
15.0000 mL | Freq: Two times a day (BID) | OROMUCOSAL | Status: DC
  Administered 2015-08-27 – 2015-09-01 (×9): 15 mL via OROMUCOSAL
  Filled 2015-08-27 (×9): qty 15

## 2015-08-27 MED ORDER — HEPARIN SODIUM (PORCINE) 1000 UNIT/ML DIALYSIS
1000.0000 [IU] | INTRAMUSCULAR | Status: DC | PRN
  Filled 2015-08-27: qty 1

## 2015-08-27 NOTE — ED Provider Notes (Signed)
Time Seen: Approximately 2050 I have reviewed the triage notes  Chief Complaint: Fever   History of Present Illness: [name] is a 80 y.o. male who was transported here by EMS for evaluation of fever. Patient arrives from [facility] and was noted to have a temperature there of 101.3 today. Himself is unable to offer any consistent history review of systems and most of the history is taken through the medical record along with the family joined the patient at the bedside. He has a history of insulin-dependent diabetes. He also has history of renal failure and receives dialysis Monday, Wednesday, and Fridays. Patient denies any complaints of pain.   Past Medical History  Diagnosis Date  . Acute MI, lateral wall, initial episode of care (HCC)   . ESRD on dialysis [facility])     on HD M/W/F  . Diabetes (HCC)   . CAD (coronary artery disease) 06/25/2014    Mild LAD disease, circumflex 90%-->0% with  2.75 x 16 Promus DES, RCA subtotally occluded with left-right collaterals, EF 45-50% by echo    . HTN (hypertension)   . PVD (peripheral vascular disease) (HCC)   . GERD (gastroesophageal reflux disease)     Patient Active Problem List   Diagnosis Date Noted  . Sepsis (HCC) 08/26/2015  . Gangrene of foot (HCC) 08/26/2015  . Type 2 diabetes mellitus (HCC) 08/26/2015  . GERD (gastroesophageal reflux disease) 08/26/2015  . CAD (coronary artery disease) 06/25/2014  . Acute MI, lateral wall, initial episode of care (HCC)   . ESRD (end stage renal disease) on dialysis [facility])     Past Surgical History  Procedure Laterality Date  . R leg surgery    . Cardiac catheterization  06/25/2014    NSTEMI s/p DES to LCx, subtotally occluded prox RCA  . Left heart catheterization with coronary angiogram N/A 06/25/2014    Procedure: LEFT HEART CATHETERIZATION WITH CORONARY ANGIOGRAM;  Surgeon: Corky Crafts, MD;  Location: [facility] CATH LAB;  Service: Cardiovascular;  Laterality: N/A;  .  Percutaneous coronary stent intervention (pci-s)  06/25/2014    Procedure: PERCUTANEOUS CORONARY STENT INTERVENTION (PCI-S);  Surgeon: Corky Crafts, MD;  Location: [facility] CATH LAB;  Service: Cardiovascular;;  . Peripheral vascular catheterization N/A 06/03/2015    Procedure: A/V Shunt Intervention;  Surgeon: Renford Dills, MD;  Location: [facility] INVASIVE CV LAB;  Service: Cardiovascular;  Laterality: N/A;  . Peripheral vascular catheterization Left 06/03/2015    Procedure: A/V Shuntogram/Fistulagram;  Surgeon: Renford Dills, MD;  Location: [facility] INVASIVE CV LAB;  Service: Cardiovascular;  Laterality: Left;  . Peripheral vascular catheterization N/A 08/07/2015    Procedure: Abdominal Aortogram w/Lower Extremity;  Surgeon: Annice Needy, MD;  Location: [facility] INVASIVE CV LAB;  Service: Cardiovascular;  Laterality: N/A;  . Peripheral vascular catheterization  08/07/2015    Procedure: Lower Extremity Intervention;  Surgeon: Annice Needy, MD;  Location: [facility] INVASIVE CV LAB;  Service: Cardiovascular;;  . Peripheral vascular catheterization N/A 08/14/2015    Procedure: A/V Shuntogram/Fistulagram;  Surgeon: Annice Needy, MD;  Location: [facility] INVASIVE CV LAB;  Service: Cardiovascular;  Laterality: N/A;    Past Surgical History  Procedure Laterality Date  . R leg surgery    . Cardiac catheterization  06/25/2014    NSTEMI s/p DES to LCx, subtotally occluded prox RCA  . Left heart catheterization with coronary angiogram N/A 06/25/2014    Procedure: LEFT HEART CATHETERIZATION WITH CORONARY ANGIOGRAM;  Surgeon: Corky Crafts, MD;  Location: [facility]  CATH LAB;  Service: Cardiovascular;  Laterality: N/A;  . Percutaneous coronary stent intervention (pci-s)  06/25/2014    Procedure: PERCUTANEOUS CORONARY STENT INTERVENTION (PCI-S);  Surgeon: Corky Crafts, MD;  Location: [facility] CATH LAB;  Service: Cardiovascular;;  . Peripheral vascular catheterization N/A 06/03/2015    Procedure: A/V Shunt Intervention;   Surgeon: Renford Dills, MD;  Location: [facility] INVASIVE CV LAB;  Service: Cardiovascular;  Laterality: N/A;  . Peripheral vascular catheterization Left 06/03/2015    Procedure: A/V Shuntogram/Fistulagram;  Surgeon: Renford Dills, MD;  Location: [facility] INVASIVE CV LAB;  Service: Cardiovascular;  Laterality: Left;  . Peripheral vascular catheterization N/A 08/07/2015    Procedure: Abdominal Aortogram w/Lower Extremity;  Surgeon: Annice Needy, MD;  Location: [facility] INVASIVE CV LAB;  Service: Cardiovascular;  Laterality: N/A;  . Peripheral vascular catheterization  08/07/2015    Procedure: Lower Extremity Intervention;  Surgeon: Annice Needy, MD;  Location: [facility] INVASIVE CV LAB;  Service: Cardiovascular;;  . Peripheral vascular catheterization N/A 08/14/2015    Procedure: A/V Shuntogram/Fistulagram;  Surgeon: Annice Needy, MD;  Location: [facility] INVASIVE CV LAB;  Service: Cardiovascular;  Laterality: N/A;    No current outpatient prescriptions on file.  Allergies:  Contrast media and Fish allergy  Family History: Family History  Problem Relation Age of Onset  . Bone cancer Father   . Hypertension Father   . Diabetes Mother   . Blindness Mother     Social History: Social History  Substance Use Topics  . Smoking status: Former Smoker    Quit date: 08/30/1956  . Smokeless tobacco: None     Comment: quit 57 yrs ago  . Alcohol Use: No     Comment: occasionally     Review of Systems:   10 point review of systems was performed and was otherwise negative: Review of systems mainly acquired through family Constitutional: No fever Eyes: No visual disturbances ENT: No sore throat, ear pain Cardiac: No chest pain Respiratory: No shortness of breath, wheezing, or stridor Abdomen: No abdominal pain, no vomiting, No diarrhea Endocrine: No weight loss, No night sweats Extremities: No peripheral edema, cyanosis Skin: No rashes, easy bruising Neurologic: No focal weakness, trouble with speech or  swollowing Urologic: No dysuria, Hematuria, or urinary frequency   Physical Exam:  ED Triage Vitals  Enc Vitals Group     BP 08/26/15 1951 134/102 mmHg     Pulse Rate 08/26/15 1951 103     Resp 08/26/15 1951 18     Temp 08/26/15 1951 100.3 F (37.9 C)     Temp Source 08/26/15 1951 Oral     SpO2 08/26/15 1951 100 %     Weight 08/26/15 1951 240 lb (108.863 kg)     Height 08/26/15 1951  (1.803 m)     Head Cir --      Peak Flow --      Pain Score 08/26/15 1955 0     Pain Loc --      Pain Edu? --      Excl. in GC? --     General: Awake , Alert , and Oriented times 2 patient's able to follow some commands Head: Normal cephalic , atraumatic Eyes: Pupils equal , round, reactive to light Nose/Throat: No nasal drainage, patent upper airway without erythema or exudate.  Neck: Supple, Full range of motion, No anterior adenopathy or palpable thyroid masses Lungs: Diminished breath sounds bilaterally to bases without any wheezes, rhonchi, or rales  Heart: Tachycardia without  murmurs , gallops , or rubs Abdomen: Soft, non tender without rebound, guarding , or rigidity; bowel sounds positive and symmetric in all 4 quadrants. No organomegaly .        Extremities: Patient has a brace over her right-sided lower extremity. Examination of his left lower extremity shows a foul smelling indurated area of dark in appearance consistent with gangrene Neurologic: n, Motor symmetric without deficits, sensory intact Skin: warm, dry, no other rashes or lesions outside of those mentioned on his left foot Labs:   All laboratory work was reviewed including any pertinent negatives or positives listed below:  Labs Reviewed  COMPREHENSIVE METABOLIC PANEL - Abnormal; Notable for the following:    Chloride 92 (*)    Glucose, Bld 229 (*)    BUN 31 (*)    Creatinine, Ser 6.21 (*)    Calcium 8.3 (*)    Total Protein 8.2 (*)    Albumin 2.6 (*)    ALT 14 (*)    Alkaline Phosphatase 151 (*)    GFR calc  non Af Amer 8 (*)    GFR calc Af Amer 9 (*)    All other components within normal limits  CBC WITH DIFFERENTIAL/PLATELET - Abnormal; Notable for the following:    WBC 14.2 (*)    RBC 3.64 (*)    Hemoglobin 10.3 (*)    HCT 32.3 (*)    RDW 15.6 (*)    Neutro Abs 11.7 (*)    Monocytes Absolute 1.2 (*)    All other components within normal limits  GLUCOSE, CAPILLARY - Abnormal; Notable for the following:    Glucose-Capillary 211 (*)    All other components within normal limits  CULTURE, BLOOD (ROUTINE X 2)  CULTURE, BLOOD (ROUTINE X 2)  MRSA PCR SCREENING  LACTIC ACID, PLASMA  LACTIC ACID, PLASMA  CBC  CREATININE, SERUM  HEMOGLOBIN A1C  BASIC METABOLIC PANEL  CBC  PROTIME-INR   review laboratory work showed renal function consistent with the patient having renal failure. Elevated white blood cell count Blood cultures 2 are pending  EKG:  ED ECG REPORT I, Jennye MoccasinBrian S Quigley, the attending physician, personally viewed and interpreted this ECG. Poor quality Date: 08/27/2015 EKG Time: 1901 Rate: 106 Rhythm: Sinus tachycardia with possible PACs QRS Axis: Left axis deviation Intervals: normal ST/T Wave abnormalities: normal Conduction Disutrbances: none Narrative Interpretation: unremarkable Poor R-wave progression in the anterior leads with no obvious acute ischemic changes   Radiology:  I  CLINICAL DATA: Fever  EXAM: CHEST - 2 VIEW  COMPARISON: 06/25/2014  FINDINGS: Cardiac shadow is within normal limits. The thoracic aorta is tortuous. The lungs are incompletely aerated. Multiple vascular stents are noted in the left arm. Elevation the right hemidiaphragm is again seen. Diffuse interstitial changes are noted without focal infiltrate. Degenerative change of the thoracic spine is noted.  IMPRESSION: Chronic changes without acute abnormality.        ED Course:  The patient was initiated on a fever evaluation. He does not appear to be septic at this time.  Patient's source for his fever is most likely his left heel which smells of direct gangrene. He has a large left-sided dark area over an area of induration. The wound has a foul smell to it. Patient was initiated on Zosyn after blood cultures 2 were obtained    Assessment:  Gangrene noticed on the left heel   Final Clinical Impression Final diagnoses:  Gangrene of foot (HCC)     Plan:  Inpatient management I discussed case with the hospitalist team, further disposition and management depends upon her evaluation*            Jennye Moccasin, MD 08/27/15 [phone]

## 2015-08-27 NOTE — Progress Notes (Signed)
[facility] - [place] at [facility]   PATIENT NAME: [name]    MR#:  [id number]  DATE OF BIRTH:  [birth year]  SUBJECTIVE:  CHIEF COMPLAINT:   Chief Complaint  Patient presents with  . Fever   patient is 80 year old African-American male with history of end-stage renal disease who is receiving hemodialysis via left upper extremity AV graft presents to the hospital with high fever. Patient was initiated on broad-spectrum antibiotics that therapy and admitted. Blood cultures revealed gram-negative Rod one out of 4. MRSA was negative.   Review of Systems  Constitutional: Positive for fever and chills. Negative for weight loss.  HENT: Negative for congestion.   Eyes: Negative for blurred vision and double vision.  Respiratory: Positive for cough and sputum production. Negative for shortness of breath and wheezing.   Cardiovascular: Negative for chest pain, palpitations, orthopnea, leg swelling and PND.  Gastrointestinal: Negative for nausea, vomiting, abdominal pain, diarrhea, constipation and blood in stool.  Genitourinary: Negative for dysuria, urgency, frequency and hematuria.  Musculoskeletal: Negative for falls.  Neurological: Negative for dizziness, tremors, focal weakness and headaches.  Endo/Heme/Allergies: Does not bruise/bleed easily.  Psychiatric/Behavioral: Negative for depression. The patient does not have insomnia.    complains of pain in left heel  VITAL SIGNS: Blood pressure 111/50, pulse 68, temperature 98.4 F (36.9 C), temperature source Oral, resp. rate 17, height  (1.753 m), weight 100.744 kg (222 lb 1.6 oz), SpO2 100 %.  PHYSICAL EXAMINATION:   GENERAL:  80 y.o.-year-old patient lying in the bed with no acute distress. Some slurring of the speech and somnolence EYES: Pupils equal, round, reactive to light and accommodation. No scleral icterus. Extraocular muscles intact.  HEENT: Head atraumatic, normocephalic. Oropharynx and  nasopharynx clear.  NECK:  Supple, no jugular venous distention. No thyroid enlargement, no tenderness.  LUNGS: Normal breath sounds bilaterally, no wheezing, rales,rhonchi or crepitation. No use of accessory muscles of respiration.  CARDIOVASCULAR: S1, S2 normal. No murmurs, rubs, or gallops.  ABDOMEN: Soft, nontender, nondistended. Bowel sounds present. No organomegaly or mass.  EXTREMITIES: No pedal edema, cyanosis, or clubbing. Gangrenous left heel with no significant drainage, but malodorous scent NEUROLOGIC: Cranial nerves II through XII are intact. Muscle strength 5/5 in all extremities. Sensation intact. Gait not checked.  PSYCHIATRIC: The patient is alert and oriented x 3.  SKIN: No obvious rash, lesion, or ulcer.   ORDERS/RESULTS REVIEWED:   CBC  Recent Labs Lab 08/26/15 1915 08/27/15 0542  WBC 14.2* 10.9*  HGB 10.3* 9.8*  HCT 32.3* 29.7*  PLT 268 249  MCV 88.6 86.5  MCH 28.4 28.6  MCHC 32.1 33.1  RDW 15.6* 15.1*  LYMPHSABS 1.3  --   MONOABS 1.2*  --   EOSABS 0.0  --   BASOSABS 0.1  --    ------------------------------------------------------------------------------------------------------------------  Chemistries   Recent Labs Lab 08/26/15 1915 08/27/15 0542  NA 136 139  K 4.5 4.3  CL 92* 94*  CO2 31 31  GLUCOSE 229* 159*  BUN 31* 36*  CREATININE 6.21* 7.07*  CALCIUM 8.3* 8.5*  AST 19  --   ALT 14*  --   ALKPHOS 151*  --   BILITOT 0.8  --    ------------------------------------------------------------------------------------------------------------------ estimated creatinine clearance is 10.1 mL/min (by C-G formula based on Cr of 7.07). ------------------------------------------------------------------------------------------------------------------ No results for input(s): TSH, T4TOTAL, T3FREE, THYROIDAB in the last 72 hours.  Invalid input(s): FREET3  Cardiac Enzymes No results for input(s): CKMB, TROPONINI, MYOGLOBIN in the last 168  hours.  Invalid input(s): CK ------------------------------------------------------------------------------------------------------------------ Invalid input(s): POCBNP ---------------------------------------------------------------------------------------------------------------  RADIOLOGY: Dg Chest 2 View  08/26/2015  CLINICAL DATA:  Fever EXAM: CHEST - 2 VIEW COMPARISON:  06/25/2014 FINDINGS: Cardiac shadow is within normal limits. The thoracic aorta is tortuous. The lungs are incompletely aerated. Multiple vascular stents are noted in the left arm. Elevation the right hemidiaphragm is again seen. Diffuse interstitial changes are noted without focal infiltrate. Degenerative change of the thoracic spine is noted. IMPRESSION: Chronic changes without acute abnormality. Electronically Signed   By: Alcide CleverMark  Lukens M.D.   On: 08/26/2015 19:50    EKG:  Orders placed or performed during the hospital encounter of 08/26/15  . EKG 12-Lead  . EKG 12-Lead    ASSESSMENT AND PLAN:  Principal Problem:   Sepsis (HCC) Active Problems:   ESRD (end stage renal disease) on dialysis (HCC)   CAD (coronary artery disease)   Gangrene of foot (HCC)   Type 2 diabetes mellitus (HCC)   GERD (gastroesophageal reflux disease) 1. Sepsis due 2 gram-negative rods. Due to left heel gangrene/infected ulcer, continue broad-spectrum antibiotic therapy, following blood culture results 2. Left heel gangrene, status post PTI by vascular surgery recently, consult podiatry, continue broad-spectrum antibiotics 3. Leukocytosis, follow his antibiotic therapy 4. End-stage renal disease, nephrology consultation is obtained. Hemodialysis per nephrology 5. Diabetes mellitus type 2, continue outpatient therapy and sliding scale insulin,  6. Dysarthria, speech therapist and also further recommendations at bedside swallowing testing done. Initiate diet depending on speech recommendations,   Management plans discussed with the  patient, family and they are in agreement.   DRUG ALLERGIES:  Allergies  Allergen Reactions  . Contrast Media [Iodinated Diagnostic Agents] Hives  . Fish Allergy Hives    Shell fish    CODE STATUS:     Code Status Orders        Start     Ordered   08/26/15 2333  Full code   Continuous     08/26/15 2332      TOTAL TIME TAKING CARE OF THIS PATIENT: 40 minutes.    Katharina CaperVAICKUTE,Jaquilla Woodroof M.D on 08/27/2015 at 1:56 PM  Between 7am to 6pm - Pager - [phone]  After 6pm go to [email or web address] - password [email or web address] [facility]  [place] [place] [facility]  Office  [phone]  CC: Primary care physician; No PCP Per Patient

## 2015-08-27 NOTE — Consult Note (Signed)
Reason for Consult: Ulceration left heel Referring Physician: [facility]  [name] is an 80 y.o. male.  HPI: This patient was recently admitted with fever and chills and a malodorous ulcer beneath his left heel. Concern for sepsis with the heel ulcer being the most likely area of concern. The patient states this ulcers been present for approximately "2 weeks ". Had a revascularization procedure with a couple of stents placed about 2 weeks ago. Podiatry is consulted for debridement and management of the heel wound.  Past Medical History  Diagnosis Date  . Acute MI, lateral wall, initial episode of care ([place])   . ESRD on dialysis [facility])     on HD M/W/F  . Diabetes ([place])   . CAD (coronary artery disease) 06/25/2014    Mild LAD disease, circumflex 90%-->0% with  2.75 x 16 Promus DES, RCA subtotally occluded with left-right collaterals, EF 45-50% by echo    . HTN (hypertension)   . PVD (peripheral vascular disease) ([place])   . GERD (gastroesophageal reflux disease)     Past Surgical History  Procedure Laterality Date  . R leg surgery    . Cardiac catheterization  06/25/2014    NSTEMI s/p DES to LCx, subtotally occluded prox RCA  . Left heart catheterization with coronary angiogram N/A 06/25/2014    Procedure: LEFT HEART CATHETERIZATION WITH CORONARY ANGIOGRAM;  Surgeon: Jettie Booze, MD;  Location: [facility] CATH LAB;  Service: Cardiovascular;  Laterality: N/A;  . Percutaneous coronary stent intervention (pci-s)  06/25/2014    Procedure: PERCUTANEOUS CORONARY STENT INTERVENTION (PCI-S);  Surgeon: Jettie Booze, MD;  Location: [facility] CATH LAB;  Service: Cardiovascular;;  . Peripheral vascular catheterization N/A 06/03/2015    Procedure: A/V Shunt Intervention;  Surgeon: Katha Cabal, MD;  Location: [place] CV LAB;  Service: Cardiovascular;  Laterality: N/A;  . Peripheral vascular catheterization Left 06/03/2015    Procedure: A/V Shuntogram/Fistulagram;   Surgeon: Katha Cabal, MD;  Location: [place] CV LAB;  Service: Cardiovascular;  Laterality: Left;  . Peripheral vascular catheterization N/A 08/07/2015    Procedure: Abdominal Aortogram w/Lower Extremity;  Surgeon: Algernon Huxley, MD;  Location: [place] CV LAB;  Service: Cardiovascular;  Laterality: N/A;  . Peripheral vascular catheterization  08/07/2015    Procedure: Lower Extremity Intervention;  Surgeon: Algernon Huxley, MD;  Location: [place] CV LAB;  Service: Cardiovascular;;  . Peripheral vascular catheterization N/A 08/14/2015    Procedure: A/V Shuntogram/Fistulagram;  Surgeon: Algernon Huxley, MD;  Location: [place] CV LAB;  Service: Cardiovascular;  Laterality: N/A;    Family History  Problem Relation Age of Onset  . Bone cancer Father   . Hypertension Father   . Diabetes Mother   . Blindness Mother     Social History:  reports that he quit smoking about 59 years ago. He does not have any smokeless tobacco history on file. He reports that he does not drink alcohol or use illicit drugs.  Allergies:  Allergies  Allergen Reactions  . Contrast Media [Iodinated Diagnostic Agents] Hives  . Fish Allergy Hives    Shell fish    Medications: I have reviewed the patient's current medications.  Results for orders placed or performed during the hospital encounter of 08/26/15 (from the past 48 hour(s))  Comprehensive metabolic panel     Status: Abnormal   Collection Time: 08/26/15  7:15 PM  Result Value Ref Range   Sodium 136 135 - 145 mmol/L   Potassium  4.5 3.5 - 5.1 mmol/L    Comment: HEMOLYSIS AT THIS LEVEL MAY AFFECT RESULT   Chloride 92 (L) 101 - 111 mmol/L   CO2 31 22 - 32 mmol/L   Glucose, Bld 229 (H) 65 - 99 mg/dL   BUN 31 (H) 6 - 20 mg/dL   Creatinine, Ser 6.21 (H) 0.61 - 1.24 mg/dL   Calcium 8.3 (L) 8.9 - 10.3 mg/dL   Total Protein 8.2 (H) 6.5 - 8.1 g/dL   Albumin 2.6 (L) 3.5 - 5.0 g/dL   AST 19 15 - 41 U/L   ALT 14 (L) 17 - 63 U/L   Alkaline  Phosphatase 151 (H) 38 - 126 U/L   Total Bilirubin 0.8 0.3 - 1.2 mg/dL   GFR calc non Af Amer 8 (L) >60 mL/min   GFR calc Af Amer 9 (L) >60 mL/min    Comment: (NOTE) The eGFR has been calculated using the CKD EPI equation. This calculation has not been validated in all clinical situations. eGFR's persistently <60 mL/min signify possible Chronic Kidney Disease.    Anion gap 13 5 - 15  CBC with Differential/Platelet     Status: Abnormal   Collection Time: 08/26/15  7:15 PM  Result Value Ref Range   WBC 14.2 (H) 3.8 - 10.6 K/uL   RBC 3.64 (L) 4.40 - 5.90 MIL/uL   Hemoglobin 10.3 (L) 13.0 - 18.0 g/dL   HCT 32.3 (L) 40.0 - 52.0 %   MCV 88.6 80.0 - 100.0 fL   MCH 28.4 26.0 - 34.0 pg   MCHC 32.1 32.0 - 36.0 g/dL   RDW 15.6 (H) 11.5 - 14.5 %   Platelets 268 150 - 440 K/uL   Neutrophils Relative % 82 %   Neutro Abs 11.7 (H) 1.4 - 6.5 K/uL   Lymphocytes Relative 9 %   Lymphs Abs 1.3 1.0 - 3.6 K/uL   Monocytes Relative 9 %   Monocytes Absolute 1.2 (H) 0.2 - 1.0 K/uL   Eosinophils Relative 0 %   Eosinophils Absolute 0.0 0 - 0.7 K/uL   Basophils Relative 0 %   Basophils Absolute 0.1 0 - 0.1 K/uL  Hemoglobin A1c     Status: Abnormal   Collection Time: 08/26/15  7:15 PM  Result Value Ref Range   Hgb A1c MFr Bld 6.9 (H) 4.0 - 6.0 %  Lactic acid, plasma     Status: None   Collection Time: 08/26/15  7:25 PM  Result Value Ref Range   Lactic Acid, Venous 2.0 0.5 - 2.0 mmol/L  Lactic acid, plasma     Status: None   Collection Time: 08/26/15 10:50 PM  Result Value Ref Range   Lactic Acid, Venous 1.6 0.5 - 2.0 mmol/L  Glucose, capillary     Status: Abnormal   Collection Time: 08/26/15 11:24 PM  Result Value Ref Range   Glucose-Capillary 211 (H) 65 - 99 mg/dL   Comment 1 Notify RN   MRSA PCR Screening     Status: None   Collection Time: 08/26/15 11:43 PM  Result Value Ref Range   MRSA by PCR NEGATIVE NEGATIVE    Comment:        The GeneXpert MRSA Assay (FDA approved for NASAL  specimens only), is one component of a comprehensive MRSA colonization surveillance program. It is not intended to diagnose MRSA infection nor to guide or monitor treatment for MRSA infections.   Glucose, capillary     Status: Abnormal   Collection Time: 08/27/15  5:19  AM  Result Value Ref Range   Glucose-Capillary 122 (H) 65 - 99 mg/dL  Basic metabolic panel     Status: Abnormal   Collection Time: 08/27/15  5:42 AM  Result Value Ref Range   Sodium 139 135 - 145 mmol/L   Potassium 4.3 3.5 - 5.1 mmol/L   Chloride 94 (L) 101 - 111 mmol/L   CO2 31 22 - 32 mmol/L   Glucose, Bld 159 (H) 65 - 99 mg/dL   BUN 36 (H) 6 - 20 mg/dL   Creatinine, Ser 7.07 (H) 0.61 - 1.24 mg/dL   Calcium 8.5 (L) 8.9 - 10.3 mg/dL   GFR calc non Af Amer 7 (L) >60 mL/min   GFR calc Af Amer 8 (L) >60 mL/min    Comment: (NOTE) The eGFR has been calculated using the CKD EPI equation. This calculation has not been validated in all clinical situations. eGFR's persistently <60 mL/min signify possible Chronic Kidney Disease.    Anion gap 14 5 - 15  CBC     Status: Abnormal   Collection Time: 08/27/15  5:42 AM  Result Value Ref Range   WBC 10.9 (H) 3.8 - 10.6 K/uL   RBC 3.43 (L) 4.40 - 5.90 MIL/uL   Hemoglobin 9.8 (L) 13.0 - 18.0 g/dL   HCT 29.7 (L) 40.0 - 52.0 %   MCV 86.5 80.0 - 100.0 fL   MCH 28.6 26.0 - 34.0 pg   MCHC 33.1 32.0 - 36.0 g/dL   RDW 15.1 (H) 11.5 - 14.5 %   Platelets 249 150 - 440 K/uL  Protime-INR     Status: Abnormal   Collection Time: 08/27/15  5:42 AM  Result Value Ref Range   Prothrombin Time 16.7 (H) 11.4 - 15.0 seconds   INR 1.34   Glucose, capillary     Status: Abnormal   Collection Time: 08/27/15  7:46 AM  Result Value Ref Range   Glucose-Capillary 129 (H) 65 - 99 mg/dL  Glucose, capillary     Status: Abnormal   Collection Time: 08/27/15 11:53 AM  Result Value Ref Range   Glucose-Capillary 122 (H) 65 - 99 mg/dL    Dg Chest 2 View  08/26/2015  CLINICAL DATA:  Fever EXAM:  CHEST - 2 VIEW COMPARISON:  06/25/2014 FINDINGS: Cardiac shadow is within normal limits. The thoracic aorta is tortuous. The lungs are incompletely aerated. Multiple vascular stents are noted in the left arm. Elevation the right hemidiaphragm is again seen. Diffuse interstitial changes are noted without focal infiltrate. Degenerative change of the thoracic spine is noted. IMPRESSION: Chronic changes without acute abnormality. Electronically Signed   By: Inez Catalina M.D.   On: 08/26/2015 19:50    Review of Systems  Constitutional: Positive for fever and chills.  HENT: Negative.   Eyes: Negative.   Respiratory: Negative.   Cardiovascular: Negative.   Gastrointestinal: Negative.   Genitourinary: Negative.        On dialysis  Musculoskeletal: Negative.   Skin:       Ulceration on his left heel. Patient states it is been there "2 weeks"  Neurological:       Some numbness and paresthesias in the feet  Endo/Heme/Allergies: Negative.   Psychiatric/Behavioral: Negative.    Blood pressure 141/42, pulse 72, temperature 98.3 F (36.8 C), temperature source Oral, resp. rate 18, height _0  (1.753 m), weight 100.744 kg (222 lb 1.6 oz), SpO2 100 %. Physical Exam  Cardiovascular:  DP and PT pulses could not be clearly palpated  bilateral.  Musculoskeletal:  Stiff range of motion in the pedal joints. Muscle testing is deferred. 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  Neurological:  Some loss of protective sensation distally in the forefoot and toes.  Skin:  The skin is warm dry and somewhat atrophic with diminished hair growth. A large full-thickness ulceration with eschar and underlying fluctuant fluid beneath the left heel. Some drainage is noted from around the periphery. It measures approximately 9-10 cm x 5 cm. There is also noted to be a more superficial ulceration over the lateral malleolus area of the right ankle. Mostly granular base with some drainage. All of the toenails are  thick, dystrophic, discolored, brittle with subungual debris. Hyperkeratosis beneath the right forefoot and heel.    Assessment/Plan: Assessment: 1. Full-thickness ulceration left heel with abscess. 2. Peripheral vascular disease. 3. Diabetes with some degree of associated neuropathy.  Plan: We will obtain x-rays for evaluation of the bony structure of the heel up underneath the ulcerative area. Discussed with the patient and the family options for debridement. Discussed challenges for healing including his poor circulation and no guarantees could be given as to his healing potential. Discussed other options which would include amputation of the leg does not heal properly. They elect to proceed with surgery for debridement of the heel we'll plan this in the next day or 2.  Gari Trovato W. 08/27/2015, 1:13 PM

## 2015-08-27 NOTE — Progress Notes (Signed)
[facility]  Lab called about [facility] results of Enterobacteriae and Proteus in the blood.   Patient is a diabetic who will be receiving debridment of heel ulcer.  MD Winona LegatoVaickute would like to continue Vancomycin and Zosyn for now due to heel ulcers.  Zosyn should cover organisms   [name], PharmD

## 2015-08-27 NOTE — Plan of Care (Signed)
Problem: Health Behavior/Discharge Planning: Goal: Ability to manage health-related needs will improve Outcome: Not Progressing Pt is blind in right eye  Problem: Skin Integrity: Goal: Risk for impaired skin integrity will decrease Outcome: Not Progressing Eschar to right heel     Problem: Activity: Goal: Risk for activity intolerance will decrease Outcome: Not Progressing chairfast

## 2015-08-27 NOTE — Progress Notes (Signed)
ANTIBIOTIC CONSULT NOTE - INITIAL  Pharmacy Consult for Vancomycina nd Zosyn dosing Indication: sepsis  Allergies  Allergen Reactions  . Contrast Media [Iodinated Diagnostic Agents] Hives  . Fish Allergy Hives    Shell fish    Patient Measurements: Height: 5\' 9"  (175.3 cm) Weight: 219 lb 4.8 oz (99.474 kg) IBW/kg (Calculated) : 70.7 Adjusted Body Weight: 82kg  Vital Signs: Temp: 99 F (37.2 C) (12/27 2342) Temp Source: Oral (12/27 2342) BP: 133/61 mmHg (12/27 2342) Pulse Rate: 87 (12/27 2342) Intake/Output from previous day:   Intake/Output from this shift:    Labs:  Recent Labs  08/26/15 1915  WBC 14.2*  HGB 10.3*  PLT 268  CREATININE 6.21*   Estimated Creatinine Clearance: 11.4 mL/min (by C-G formula based on Cr of 6.21). No results for input(s): VANCOTROUGH, VANCOPEAK, VANCORANDOM, GENTTROUGH, GENTPEAK, GENTRANDOM, TOBRATROUGH, TOBRAPEAK, TOBRARND, AMIKACINPEAK, AMIKACINTROU, AMIKACIN in the last 72 hours.   Microbiology: No results found for this or any previous visit (from the past 720 hour(s)).  Medical History: Past Medical History  Diagnosis Date  . Acute MI, lateral wall, initial episode of care (HCC)   . ESRD on dialysis [facility](HCC)     on HD M/W/F  . Diabetes (HCC)   . CAD (coronary artery disease) 06/25/2014    Mild LAD disease, circumflex 90%-->0% with  2.75 x 16 Promus DES, RCA subtotally occluded with left-right collaterals, EF 45-50% by echo    . HTN (hypertension)   . PVD (peripheral vascular disease) (HCC)   . GERD (gastroesophageal reflux disease)     Medications:    Assessment: Blood cx pending CXR: no acute disease  Goal of Therapy:    Plan:  TBW 99.5kg  IBW 70.7kg  DW 82kg Vancomycin 1750 mg once ordered with 1 gram post HD. Level will need to be scheduled before 3rd HD session.  Zosyn 3.375 grams q 12 hours ordered.   Quinlyn Tep S 08/27/2015,12:59 AM

## 2015-08-27 NOTE — Care Management Note (Signed)
[facility] M[facility].  Admission [facility] been [facility] to [facility] and I will forward additional records at discharge.  Ivor ReiningKim Elaysha Bevard  Dialysis Liaison  [phone]

## 2015-08-27 NOTE — Progress Notes (Signed)
Hemodialysis completed. 

## 2015-08-27 NOTE — Progress Notes (Signed)
[facility]  ROUNDING NOTE   Subjective:  [facility] with [facility] high fever of 101.3. He still feels warm this a.m. He has a large eschar on his left heel which also was having some drainage. He was admitted for suspicion of sepsis. He is due for hemodialysis today.   Objective:  Vital signs in last 24 hours:  Temp:  [98.3 F (36.8 C)-100.3 F (37.9 C)] 98.3 F (36.8 C) (12/28 0444) Pulse Rate:  [68-103] 72 (12/28 0906) Resp:  [18] 18 (12/28 0444) BP: (124-154)/(42-102) 141/42 mmHg (12/28 0906) SpO2:  [76 %-100 %] 100 % (12/28 0457) Weight:  [99.474 kg (219 lb 4.8 oz)-108.863 kg (240 lb)] 100.744 kg (222 lb 1.6 oz) (12/28 0348)  Weight change:  Filed Weights   08/26/15 1951 08/26/15 2342 08/27/15 0348  Weight: 108.863 kg (240 lb) 99.474 kg (219 lb 4.8 oz) 100.744 kg (222 lb 1.6 oz)    Intake/Output: I/O last 3 completed shifts: In: 703 [I.V.:703] Out: 0    Intake/Output this shift:  Total I/O In: 433 [I.V.:433] Out: 0   Physical Exam: General: NAD, resting in bed  Head: Normocephalic, atraumatic. Moist oral mucosal membranes  Eyes: Anicteric  Neck: Supple, trachea midline  Lungs:  Clear to auscultation normal effort  Heart: Regular rate and rhythm  Abdomen:  Soft, nontender, BS present  Extremities:  1+ peripheral edema, large eschar on left heel  Neurologic: Nonfocal, moving all four extremities  Skin: Eschar left heel  Access: LUE AVF    Basic Metabolic Panel:  Recent Labs Lab 08/26/15 1915 08/27/15 0542  NA 136 139  K 4.5 4.3  CL 92* 94*  CO2 31 31  GLUCOSE 229* 159*  BUN 31* 36*  CREATININE 6.21* 7.07*  CALCIUM 8.3* 8.5*    Liver Function Tests:  Recent Labs Lab 08/26/15 1915  AST 19  ALT 14*  ALKPHOS 151*  BILITOT 0.8  PROT 8.2*  ALBUMIN 2.6*   No results for input(s): LIPASE, AMYLASE in the last 168 hours. No results for input(s): AMMONIA in the last 168  hours.  CBC:  Recent Labs Lab 08/26/15 1915 08/27/15 0542  WBC 14.2* 10.9*  NEUTROABS 11.7*  --   HGB 10.3* 9.8*  HCT 32.3* 29.7*  MCV 88.6 86.5  PLT 268 249    Cardiac Enzymes: No results for input(s): CKTOTAL, CKMB, CKMBINDEX, TROPONINI in the last 168 hours.  BNP: Invalid input(s): POCBNP  CBG:  Recent Labs Lab 08/26/15 2324 08/27/15 0519 08/27/15 0746  GLUCAP 211* 122* 129*    Microbiology: Results for orders placed or performed during the hospital encounter of 08/26/15  MRSA PCR Screening     Status: None   Collection Time: 08/26/15 11:43 PM  Result Value Ref Range Status   MRSA by PCR NEGATIVE NEGATIVE Final    Comment:        The GeneXpert MRSA Assay (FDA approved for NASAL specimens only), is one component of a comprehensive MRSA colonization surveillance program. It is not intended to diagnose MRSA infection nor to guide or monitor treatment for MRSA infections.     Coagulation Studies:  Recent Labs  08/27/15 0542  LABPROT 16.7*  INR 1.34    Urinalysis: No results for input(s): COLORURINE, LABSPEC, PHURINE, GLUCOSEU, HGBUR, BILIRUBINUR, KETONESUR, PROTEINUR, UROBILINOGEN, NITRITE, LEUKOCYTESUR in the last 72 hours.  Invalid input(s): APPERANCEUR    Imaging: Dg Chest 2 View  08/26/2015  CLINICAL DATA:  Fever EXAM: CHEST - 2 VIEW  COMPARISON:  06/25/2014 FINDINGS: Cardiac shadow is within normal limits. The thoracic aorta is tortuous. The lungs are incompletely aerated. Multiple vascular stents are noted in the left arm. Elevation the right hemidiaphragm is again seen. Diffuse interstitial changes are noted without focal infiltrate. Degenerative change of the thoracic spine is noted. IMPRESSION: Chronic changes without acute abnormality. Electronically Signed   By: Alcide Clever M.D.   On: 08/26/2015 19:50     Medications:     . antiseptic oral rinse  7 mL Mouth Rinse q12n4p  . aspirin EC  81 mg Oral Daily  . chlorhexidine  15 mL  Mouth Rinse BID  . cinacalcet  90 mg Oral Daily  . clopidogrel  75 mg Oral Daily  . heparin  5,000 Units Subcutaneous 3 times per day  . insulin aspart  0-9 Units Subcutaneous Q6H  . lanthanum  500 mg Oral TID WC  . losartan  12.5 mg Oral Daily  . metoprolol tartrate  12.5 mg Oral BID  . pantoprazole  40 mg Oral Daily  . piperacillin-tazobactam (ZOSYN)  IV  3.375 g Intravenous Q12H  . sevelamer carbonate  2.4 g Oral TID WC  . sodium chloride  3 mL Intravenous Q12H  . vancomycin  1,000 mg Intravenous Q M,W,F-HD   acetaminophen **OR** acetaminophen, albuterol, ipratropium-albuterol, ondansetron **OR** ondansetron (ZOFRAN) IV, oxyCODONE  Assessment/ Plan:  80 y.o. male past medical history of end-stage renal disease on hematemesis Monday, Wednesday, Friday, diabetes mellitus, coronary artery disease, hypertension, peripheral vascular disease, GERD, anemia chronic kidney disease, history of myocardial infarction, secondary hyperparathyroidism presented with fever and eschar on left heel.  1. End-stage renal disease on hematemesis Monday, Wednesday, Friday. The patient is due for hemodialysis today and we will prepare orders. Continue dialysis on usual schedule while here.  2. Anemia of chronic kidney disease. Hemoglobin currently 9.8. We will start the patient on Epogen 10,000 units IV with dialysis while here.  3. Secondary hyperparathyroidism. We will continue the patient on Sensipar 90 mg by mouth daily as well as also in all 500 mg by mouth 3 times a day with meals. Check intact PTH and phosphorus today.  4. Hypertension. Blood pressure 141/42. Continue low-dose losartan 12.5 mg by mouth daily.  5. Fever/left heel eschar. Heel appears to be the source of infection.  Await further culture data. For now agree with use of Zosyn and vancomycin.   LOS: 1 [name] 12/28/201611:55 AM

## 2015-08-27 NOTE — Evaluation (Signed)
Clinical/Bedside Swallow Evaluation Patient Details  Name: [name] MRN: [id number] Date of Birth: 1935-04-15  Today's Date: 08/27/2015 Time: SLP Start Time (ACUTE ONLY): 1400 SLP Stop Time (ACUTE ONLY): 1440 SLP Time Calculation (min) (ACUTE ONLY): 40 min  Past Medical History:  Past Medical History  Diagnosis Date  . Acute MI, lateral wall, initial episode of care (HCC)   . ESRD on dialysis [facility])     on HD M/W/F  . Diabetes (HCC)   . CAD (coronary artery disease) 06/25/2014    Mild LAD disease, circumflex 90%-->0% with  2.75 x 16 Promus DES, RCA subtotally occluded with left-right collaterals, EF 45-50% by echo    . HTN (hypertension)   . PVD (peripheral vascular disease) (HCC)   . GERD (gastroesophageal reflux disease)    Past Surgical History:  Past Surgical History  Procedure Laterality Date  . R leg surgery    . Cardiac catheterization  06/25/2014    NSTEMI s/p DES to LCx, subtotally occluded prox RCA  . Left heart catheterization with coronary angiogram N/A 06/25/2014    Procedure: LEFT HEART CATHETERIZATION WITH CORONARY ANGIOGRAM;  Surgeon: [name] Crafts, MD;  Location: [facility] CATH LAB;  Service: Cardiovascular;  Laterality: N/A;  . Percutaneous coronary stent intervention (pci-s)  06/25/2014    Procedure: PERCUTANEOUS CORONARY STENT INTERVENTION (PCI-S);  Surgeon: [name] Crafts, MD;  Location: [facility] CATH LAB;  Service: Cardiovascular;;  . Peripheral vascular catheterization N/A 06/03/2015    Procedure: A/V Shunt Intervention;  Surgeon: [name] Dills, MD;  Location: [facility] INVASIVE CV LAB;  Service: Cardiovascular;  Laterality: N/A;  . Peripheral vascular catheterization Left 06/03/2015    Procedure: A/V Shuntogram/Fistulagram;  Surgeon: [name] Dills, MD;  Location: [facility] INVASIVE CV LAB;  Service: Cardiovascular;  Laterality: Left;  . Peripheral vascular catheterization N/A 08/07/2015    Procedure: Abdominal Aortogram w/Lower Extremity;  Surgeon: [name] Needy, MD;  Location: [facility] INVASIVE CV LAB;  Service: Cardiovascular;  Laterality: N/A;  . Peripheral vascular catheterization  08/07/2015    Procedure: Lower Extremity Intervention;  Surgeon: [name] Needy, MD;  Location: [facility] INVASIVE CV LAB;  Service: Cardiovascular;;  . Peripheral vascular catheterization N/A 08/14/2015    Procedure: A/V Shuntogram/Fistulagram;  Surgeon: [name] Needy, MD;  Location: [facility] INVASIVE CV LAB;  Service: Cardiovascular;  Laterality: N/A;   HPI:  [name] is a 80 y.o. male who presents with fever at his nursing facility to 101.3. Patient was sent to the ED for evaluation. Here he was noted to be borderline febrile, tachycardic, with an elevated white blood cell count. Exam revealed a left heel ulceration with some drainage and eschar. Patient has known peripheral vascular disease, and her family members are with him here today he recently had lower extremity stents placed. Hospitalists were called for admission for sepsis.   Assessment / Plan / Recommendation Clinical Impression  Pt presents w/no overt s/s of aspiration w/any tested consistency and mild aspiration risk. Pt demonstrated no overt s/s of aspiration w/any tested consistency. Vocal quality remained clear throughout trials. Unable to assess laryngeal elevation d/t pt size and positioning. Oral phase appeared [name], although pt appeared to fatigue slightly while ST in room. Pt able to masticate and clear solid consistency. No pocketing or holding was observed. A-P transit appeared timely. Oral mech exam revealed oral motor abilities to be mostly [facility]. Recommend Dyspahgia III diet (for ease of mastication) w/thin liquids. (Renal diet; 1200 ml fluid restriction; carb control (per nsg)).  Aspiration  precautions.     Aspiration Risk  Mild aspiration risk    Diet Recommendation Thin liquid;Dysphagia 3 (Mech soft)   Liquid Administration via: Cup;Straw Medication Administration: Whole meds with  liquid Supervision: Staff to assist with self feeding Compensations: Minimize environmental distractions;Slow rate;Small sips/bites Postural Changes: Seated upright at 90 degrees    Other  Recommendations Oral Care Recommendations: Oral care BID;Staff/trained caregiver to provide oral care   Follow up Recommendations  Skilled Nursing facility    Frequency and Duration min 2x/week  1 week       Prognosis Prognosis for Safe Diet Advancement: Good      Swallow Study   General Date of Onset: 08/26/15 HPI: [name] is a 80 y.o. male who presents with fever at his nursing facility to 101.3. Patient was sent to the ED for evaluation. Here he was noted to be borderline febrile, tachycardic, with an elevated white blood cell count. Exam revealed a left heel ulceration with some drainage and eschar. Patient has known peripheral vascular disease, and her family members are with him here today he recently had lower extremity stents placed. Hospitalists were called for admission for sepsis. Type of Study: Bedside Swallow Evaluation Previous Swallow Assessment: None Diet Prior to this Study: NPO Temperature Spikes Noted: N/A Respiratory Status: Nasal cannula History of Recent Intubation: No Behavior/Cognition: Alert;Requires cueing;Cooperative Oral Cavity Assessment: Within Functional Limits Oral Care Completed by SLP: No Oral Cavity - Dentition: Adequate natural dentition Vision: Functional for self-feeding Self-Feeding Abilities: Needs assist;Needs set up;Able to feed self Patient Positioning: Upright in bed Baseline Vocal Quality: Normal Volitional Cough: Strong Volitional Swallow: Able to elicit    Oral/Motor/Sensory Function Overall Oral Motor/Sensory Function: Within functional limits   Ice Chips Ice chips: Within functional limits Presentation: Spoon   Thin Liquid Thin Liquid: Within functional limits Presentation: Cup;Self Fed;Straw    Nectar Thick Nectar Thick Liquid:  Not tested   Honey Thick Honey Thick Liquid: Not tested   Puree Puree: Within functional limits Presentation: Self Fed;Spoon   Solid Solid: Within functional limits Presentation: Self Fed       [place],Maaran 08/27/2015,2:42 PM

## 2015-08-27 NOTE — Progress Notes (Signed)
Pt admitted to [facility]. VSS. O2 sat in upper 90s @4lpm .  Alert and oriented. Blind in the right eye. Blurry vision in the left eye.  LFA graft intact +bruit/thrill.  Left heel noted with eschar. Covered with 4x4 and ABD and wrapped with kerlix for now until podiatry sees patient later today. No complaints voiced at this time. Will cont to monitor.

## 2015-08-28 ENCOUNTER — Inpatient Hospital Stay: Payer: Medicare Other | Admitting: Anesthesiology

## 2015-08-28 ENCOUNTER — Encounter: Payer: Self-pay | Admitting: Anesthesiology

## 2015-08-28 ENCOUNTER — Encounter
Admission: EM | Disposition: A | Discharge status: Skilled Nursing Facility | Payer: Self-pay | Source: Home / Self Care | Attending: Internal Medicine

## 2015-08-28 HISTORY — PX: IRRIGATION AND DEBRIDEMENT FOOT: SHX6602

## 2015-08-28 LAB — GLUCOSE, CAPILLARY
GLUCOSE-CAPILLARY: 135 mg/dL — AB (ref 65–99)
Glucose-Capillary: 148 mg/dL — ABNORMAL HIGH (ref 65–99)
Glucose-Capillary: 188 mg/dL — ABNORMAL HIGH (ref 65–99)
Glucose-Capillary: 119 mg/dL — ABNORMAL HIGH (ref 65–99)
Glucose-Capillary: 141 mg/dL — ABNORMAL HIGH (ref 65–99)

## 2015-08-28 LAB — PARATHYROID HORMONE, INTACT (NO CA): PTH: 244 pg/mL — ABNORMAL HIGH (ref 15–65)

## 2015-08-28 LAB — HEPATITIS B SURFACE ANTIGEN: Hepatitis B Surface Ag: NEGATIVE

## 2015-08-28 SURGERY — IRRIGATION AND DEBRIDEMENT FOOT
Anesthesia: General | Laterality: Left

## 2015-08-28 MED ORDER — MORPHINE SULFATE (PF) 4 MG/ML IV SOLN
4.0000 mg | INTRAVENOUS | Status: DC | PRN
  Administered 2015-08-31: 4 mg via INTRAVENOUS
  Filled 2015-08-28: qty 1

## 2015-08-28 MED ORDER — FENTANYL CITRATE (PF) 100 MCG/2ML IJ SOLN: 25.0000 ug | INTRAMUSCULAR | Status: DC | PRN

## 2015-08-28 MED ORDER — SODIUM CHLORIDE 0.9 % IR SOLN
Status: DC | PRN
Start: 2015-08-28 — End: 2015-08-28
  Administered 2015-08-28: 1100 mL

## 2015-08-28 MED ORDER — EPOETIN ALFA 10000 UNIT/ML IJ SOLN
10000.0000 [IU] | INTRAMUSCULAR | Status: DC
  Administered 2015-08-29 – 2015-09-01 (×2): 10000 [IU] via INTRAVENOUS

## 2015-08-28 MED ORDER — OXYCODONE-ACETAMINOPHEN 5-325 MG PO TABS
1.0000 | ORAL_TABLET | ORAL | Status: DC | PRN
  Administered 2015-08-29 – 2015-08-31 (×2): 1 via ORAL
  Filled 2015-08-28 (×2): qty 1

## 2015-08-28 MED ORDER — THROMBIN 5000 UNITS EX SOLR
CUTANEOUS | Status: AC
  Filled 2015-08-28: qty 5000

## 2015-08-28 MED ORDER — GLYCOPYRROLATE 0.2 MG/ML IJ SOLN
INTRAMUSCULAR | Status: DC | PRN
  Administered 2015-08-28: 0.2 mg via INTRAVENOUS

## 2015-08-28 MED ORDER — SODIUM CHLORIDE 0.9 % IJ SOLN: 3.0000 mL | INTRAMUSCULAR | Status: DC | PRN

## 2015-08-28 MED ORDER — SODIUM CHLORIDE 0.9 % IV SOLN: 250.0000 mL | INTRAVENOUS | Status: DC | PRN

## 2015-08-28 MED ORDER — THROMBIN 5000 UNITS EX SOLR
CUTANEOUS | Status: DC | PRN
  Administered 2015-08-28: 5000 [IU] via TOPICAL

## 2015-08-28 MED ORDER — SODIUM CHLORIDE 0.9 % IJ SOLN
3.0000 mL | Freq: Two times a day (BID) | INTRAMUSCULAR | Status: DC
  Administered 2015-08-28 – 2015-08-31 (×4): 3 mL via INTRAVENOUS

## 2015-08-28 MED ORDER — ONDANSETRON HCL 4 MG/2ML IJ SOLN: 4.0000 mg | Freq: Once | INTRAMUSCULAR | Status: DC | PRN

## 2015-08-28 MED ORDER — LACTATED RINGERS IV SOLN
INTRAVENOUS | Status: DC | PRN
  Administered 2015-08-28: 17:00:00 via INTRAVENOUS

## 2015-08-28 MED ORDER — MORPHINE SULFATE (PF) 2 MG/ML IV SOLN
2.0000 mg | INTRAVENOUS | Status: DC | PRN
  Administered 2015-08-28 – 2015-08-31 (×4): 2 mg via INTRAVENOUS
  Filled 2015-08-28 (×4): qty 1

## 2015-08-28 MED ORDER — PROPOFOL 10 MG/ML IV BOLUS
INTRAVENOUS | Status: DC | PRN
  Administered 2015-08-28: 130 mg via INTRAVENOUS

## 2015-08-28 MED ORDER — VANCOMYCIN HCL 1000 MG IV SOLR
INTRAVENOUS | Status: AC
  Filled 2015-08-28: qty 1000

## 2015-08-28 MED ORDER — PHENYLEPHRINE HCL 10 MG/ML IJ SOLN
INTRAMUSCULAR | Status: DC | PRN
  Administered 2015-08-28 (×6): 100 ug via INTRAVENOUS

## 2015-08-28 MED ORDER — FENTANYL CITRATE (PF) 100 MCG/2ML IJ SOLN
25.0000 ug | INTRAMUSCULAR | Status: DC | PRN
  Administered 2015-08-28 (×4): 25 ug via INTRAVENOUS

## 2015-08-28 SURGICAL SUPPLY — 52 items
BAG COUNTER SPONGE EZ (MISCELLANEOUS) IMPLANT
BANDAGE ELASTIC 4 LF NS (GAUZE/BANDAGES/DRESSINGS) ×3 IMPLANT
BLADE OSCILLATING/SAGITTAL (BLADE)
BLADE SURG 15 STRL LF DISP TIS (BLADE) IMPLANT
BLADE SURG 15 STRL SS (BLADE)
BLADE SW THK.38XMED LNG THN (BLADE) IMPLANT
BNDG ESMARK 4X12 TAN STRL LF (GAUZE/BANDAGES/DRESSINGS) IMPLANT
BNDG GAUZE 4.5X4.1 6PLY STRL (MISCELLANEOUS) ×6 IMPLANT
CANISTER SUCT 1200ML W/VALVE (MISCELLANEOUS) ×3 IMPLANT
COUNTER SPONGE BAG EZ (MISCELLANEOUS)
CUFF TOURN 18 STER (MISCELLANEOUS) IMPLANT
CUFF TOURN DUAL PL 12 NO SLV (MISCELLANEOUS) IMPLANT
DRAPE FLUOR MINI C-ARM 54X84 (DRAPES) IMPLANT
DRSG MEPITEL 4X7.2 (GAUZE/BANDAGES/DRESSINGS) ×3 IMPLANT
DURAPREP 26ML APPLICATOR (WOUND CARE) ×3 IMPLANT
GAUZE FLUFF 18X24 1PLY STRL (GAUZE/BANDAGES/DRESSINGS) IMPLANT
GAUZE PETRO XEROFOAM 1X8 (MISCELLANEOUS) IMPLANT
GAUZE SPONGE 4X4 12PLY STRL (GAUZE/BANDAGES/DRESSINGS) IMPLANT
GAUZE STRETCH 2X75IN STRL (MISCELLANEOUS) IMPLANT
GLOVE BIO SURGEON STRL SZ7.5 (GLOVE) ×6 IMPLANT
GLOVE INDICATOR 8.0 STRL GRN (GLOVE) ×6 IMPLANT
GOWN STRL REUS W/ TWL LRG LVL3 (GOWN DISPOSABLE) ×2 IMPLANT
GOWN STRL REUS W/TWL LRG LVL3 (GOWN DISPOSABLE) ×4
HANDPIECE VERSAJET DEBRIDEMENT (MISCELLANEOUS) ×3 IMPLANT
KIT STIMULAN RAPID CURE 5CC (Orthopedic Implant) ×3 IMPLANT
LABEL OR SOLS (LABEL) IMPLANT
NEEDLE FILTER BLUNT 18X 1/2SAF (NEEDLE) ×2
NEEDLE FILTER BLUNT 18X1 1/2 (NEEDLE) ×1 IMPLANT
NEEDLE HYPO 25X1 1.5 SAFETY (NEEDLE) IMPLANT
NS IRRIG 500ML POUR BTL (IV SOLUTION) ×3 IMPLANT
PACK EXTREMITY ARMC (MISCELLANEOUS) ×3 IMPLANT
PAD ABD DERMACEA PRESS 5X9 (GAUZE/BANDAGES/DRESSINGS) ×6 IMPLANT
PAD GROUND ADULT SPLIT (MISCELLANEOUS) ×3 IMPLANT
PENCIL ELECTRO HAND CTR (MISCELLANEOUS) IMPLANT
RASP SM TEAR CROSS CUT (RASP) IMPLANT
SOL PREP PVP 2OZ (MISCELLANEOUS)
SOLUTION PREP PVP 2OZ (MISCELLANEOUS) IMPLANT
SPOGE SURGIFLO 8M (HEMOSTASIS) ×2
SPONGE LAP 18X18 5 PK (GAUZE/BANDAGES/DRESSINGS) ×3 IMPLANT
SPONGE SURGIFLO 8M (HEMOSTASIS) ×1 IMPLANT
STOCKINETTE STRL 4IN 9604848 (GAUZE/BANDAGES/DRESSINGS) IMPLANT
STOCKINETTE STRL 6IN 960660 (GAUZE/BANDAGES/DRESSINGS) ×3 IMPLANT
STRAP SAFETY BODY (MISCELLANEOUS) ×3 IMPLANT
SUT ETHILON 4-0 (SUTURE) ×4
SUT ETHILON 4-0 FS2 18XMFL BLK (SUTURE) ×2
SUT VIC AB 3-0 SH 27 (SUTURE)
SUT VIC AB 3-0 SH 27X BRD (SUTURE) IMPLANT
SUT VIC AB 4-0 FS2 27 (SUTURE) IMPLANT
SUTURE ETHLN 4-0 FS2 18XMF BLK (SUTURE) ×2 IMPLANT
SWAB DUAL CULTURE TRANS RED ST (MISCELLANEOUS) ×3 IMPLANT
SYR 3ML LL SCALE MARK (SYRINGE) ×3 IMPLANT
SYRINGE 10CC LL (SYRINGE) IMPLANT

## 2015-08-28 NOTE — Anesthesia Procedure Notes (Signed)
Procedure Name: LMA Insertion Date/Time: 08/28/2015 5:26 PM Performed by: Omer JackWEATHERLY, Lionell Matuszak Pre-anesthesia Checklist: Patient identified, Patient being monitored, Timeout performed, Emergency Drugs available and Suction available Patient Re-evaluated:Patient Re-evaluated prior to inductionOxygen Delivery Method: Circle system utilized Preoxygenation: Pre-oxygenation with 100% oxygen Intubation Type: IV induction Ventilation: Mask ventilation without difficulty LMA: LMA inserted LMA Size: 4.5 Tube type: Oral Number of attempts: 1 Placement Confirmation: positive ETCO2 and breath sounds checked- equal and bilateral Tube secured with: Tape Dental Injury: Teeth and Oropharynx as per pre-operative assessment

## 2015-08-28 NOTE — Anesthesia Preprocedure Evaluation (Addendum)
Anesthesia Evaluation  Patient identified by MRN, date of birth, ID band Patient awake    Reviewed: Allergy & Precautions, NPO status , Patient's Chart, lab work & pertinent test results, reviewed documented beta blocker date and time   Airway Mallampati: III  TM Distance: >3 FB     Dental  (+) Chipped   Pulmonary former smoker,           Cardiovascular hypertension, Pt. on medications and Pt. on home beta blockers + CAD, + Past MI and + Peripheral Vascular Disease       Neuro/Psych    GI/Hepatic GERD  ,  Endo/Other  diabetes  Renal/GU ESRFRenal disease     Musculoskeletal   Abdominal   Peds  Hematology   Anesthesia Other Findings Dialysis yesterday, K [place]. No cardiac symptoms. Does not take albuterol or NTG.  Reproductive/Obstetrics                           Anesthesia Physical Anesthesia Plan  ASA: III  Anesthesia Plan: General   Post-op Pain Management:    Induction: Intravenous  Airway Management Planned: Oral ETT and LMA  Additional Equipment:   Intra-op Plan:   Post-operative Plan:   Informed Consent: I have reviewed the patients History and Physical, chart, labs and discussed the procedure including the risks, benefits and alternatives for the proposed anesthesia with the patient or authorized representative who has indicated his/her understanding and acceptance.     Plan Discussed with: CRNA  Anesthesia Plan Comments:         Anesthesia Quick Evaluation

## 2015-08-28 NOTE — NC FL2 (Signed)
[place] MEDICAID FL2 LEVEL OF CARE SCREENING TOOL     IDENTIFICATION  Patient Name: [name] Birthdate: [birth year] Sex: male Admission Date (Current Location): 08/26/2015  [place] and [place] Number:  Chiropodist and Address:  [facility], [address] [place], [place] [address]      Provider Number: [id number]  Attending Physician Name and Address:  Katharina Caper, MD  Relative Name and Phone Number:       Current Level of Care: Hospital Recommended Level of Care: Skilled Nursing Facility Prior Approval Number:    Date Approved/Denied:   PASRR Number:    Discharge Plan: SNF    Current Diagnoses: Patient Active Problem List   Diagnosis Date Noted  . Sepsis (HCC) 08/26/2015  . Gangrene of foot (HCC) 08/26/2015  . Type 2 diabetes mellitus (HCC) 08/26/2015  . GERD (gastroesophageal reflux disease) 08/26/2015  . CAD (coronary artery disease) 06/25/2014  . Acute MI, lateral wall, initial episode of care (HCC)   . ESRD (end stage renal disease) on dialysis (HCC)     Orientation RESPIRATION BLADDER Height & Weight       Normal, O2 (3 liters) Incontinent   220 lbs.  BEHAVIORAL SYMPTOMS/MOOD NEUROLOGICAL BOWEL NUTRITION STATUS      Incontinent Diet  AMBULATORY STATUS COMMUNICATION OF NEEDS Skin   Total Care Verbally Other (Comment) (left heel eschar)                       Personal Care Assistance Level of Assistance  Total care       Total Care Assistance: Maximum assistance   Functional Limitations Info             SPECIAL CARE FACTORS FREQUENCY                       Contractures Contractures Info: Not present    Additional Factors Info                  Current Medications (08/28/2015):  This is the current hospital active medication list Current Facility-Administered Medications  Medication Dose Route Frequency Provider Last Rate Last Dose  . 0.9 %  sodium chloride infusion  100  mL Intravenous PRN Munsoor Lateef, MD      . 0.9 %  sodium chloride infusion  100 mL Intravenous PRN Munsoor Lateef, MD      . acetaminophen (TYLENOL) tablet 650 mg  650 mg Oral Q6H PRN Oralia Manis, MD       Or  . acetaminophen (TYLENOL) suppository 650 mg  650 mg Rectal Q6H PRN Oralia Manis, MD      . albuterol (PROVENTIL) (2.5 MG/3ML) 0.083% nebulizer solution 3 mL  3 mL Inhalation Q6H PRN Oralia Manis, MD      . alteplase (CATHFLO ACTIVASE) injection 2 mg  2 mg Intracatheter Once PRN Munsoor Lateef, MD      . antiseptic oral rinse (CPC / CETYLPYRIDINIUM CHLORIDE 0.05%) solution 7 mL  7 mL Mouth Rinse q12n4p Oralia Manis, MD   7 mL at 08/27/15 1600  . aspirin EC tablet 81 mg  81 mg Oral Daily Oralia Manis, MD   81 mg at 08/28/15 0950  . chlorhexidine (PERIDEX) 0.12 % solution 15 mL  15 mL Mouth Rinse BID Oralia Manis, MD   15 mL at 08/28/15 0953  . cinacalcet (SENSIPAR) tablet 90 mg  90 mg Oral Daily Oralia Manis, MD   90  mg at 08/28/15 0950  . clopidogrel (PLAVIX) tablet 75 mg  75 mg Oral Daily Oralia Manisavid Willis, MD   75 mg at 08/27/15 0908  . heparin injection 1,000 Units  1,000 Units Dialysis PRN Munsoor Lateef, MD      . heparin injection 5,000 Units  5,000 Units Subcutaneous 3 times per day Oralia Manisavid Willis, MD   5,000 Units at 08/28/15 0532  . insulin aspart (novoLOG) injection 0-9 Units  0-9 Units Subcutaneous Q6H Oralia Manisavid Willis, MD   1 Units at 08/28/15 0533  . ipratropium-albuterol (DUONEB) 0.5-2.5 (3) MG/3ML nebulizer solution 3 mL  3 mL Nebulization Q6H PRN Oralia Manisavid Willis, MD      . Carlus Pavlovlanthanum [facility](FOSRENOL) chewable tablet 500 mg  500 mg Oral TID WC Oralia Manisavid Willis, MD   500 mg at 08/28/15 0809  . lidocaine (PF) (XYLOCAINE) 1 % injection 5 mL  5 mL Intradermal PRN Munsoor Lateef, MD      . lidocaine-prilocaine (EMLA) cream 1 application  1 application Topical PRN Munsoor Lateef, MD      . losartan (COZAAR) tablet 12.5 mg  12.5 mg Oral Daily Oralia Manisavid Willis, MD   12.5 mg at 08/28/15 0951  . metoprolol  tartrate (LOPRESSOR) tablet 12.5 mg  12.5 mg Oral BID Oralia Manisavid Willis, MD   12.5 mg at 08/28/15 [id number]  . ondansetron (ZOFRAN) tablet 4 mg  4 mg Oral Q6H PRN Oralia Manisavid Willis, MD       Or  . ondansetron [facility](ZOFRAN) injection 4 mg  4 mg Intravenous Q6H PRN Oralia Manisavid Willis, MD      . oxyCODONE (Oxy IR/ROXICODONE) immediate release tablet 5 mg  5 mg Oral Q6H PRN Oralia Manisavid Willis, MD      . pantoprazole (PROTONIX) EC tablet 40 mg  40 mg Oral Daily Oralia Manisavid Willis, MD   40 mg at 08/28/15 0950  . pentafluoroprop-tetrafluoroeth (GEBAUERS) aerosol 1 application  1 application Topical PRN Munsoor Lateef, MD      . piperacillin-tazobactam (ZOSYN) IVPB 3.375 g  3.375 g Intravenous Q12H Oralia Manisavid Willis, MD   3.375 g at 08/28/15 1010  . sevelamer carbonate (RENVELA) powder PACK 2.4 g  2.4 g Oral TID WC Oralia Manisavid Willis, MD   2.4 g at 08/28/15 0808  . sodium chloride 0.9 % injection 3 mL  3 mL Intravenous Q12H Oralia Manisavid Willis, MD   3 mL at 08/28/15 1007  . vancomycin (VANCOCIN) IVPB 1000 mg/200 mL premix  1,000 mg Intravenous Q M,W,F-HD Oralia Manisavid Willis, MD   1,000 mg at 08/27/15 1227     Discharge Medications: Please see discharge summary for a list of discharge medications.  Relevant Imaging Results:  Relevant Lab Results:   Additional Information    York SpanielMonica [place] Kuang, LCSW

## 2015-08-28 NOTE — Op Note (Signed)
Date of operation: 08/28/2015.  Surgeon: Ricci Barkerodd W Nicholle Falzon DPM.  Preoperative diagnosis: Gangrenous ulceration with abscess left heel.  Postoperative diagnosis: Same.  Procedure: Debridement infected ulceration left heel.  Anesthesia: LMA.  Hemostasis: None.  Estimated blood loss: 50 cc.  Pathology: None.  Implants: Stimulan rapid cure antibiotic beads impregnated with vancomycin.  Cultures: Deep wound cultures left heel.  Complications: None apparent.  Operative indications: This is a 80 year old male with chronic history of peripheral vascular disease with recent development of a gangrenous ulceration on his left heel. Was admitted for management and debridement of the wound as it appeared to be causing sepsis.  Operative procedure: Patient was taken the operating room and placed on the table in the supine position. Following general LMA anesthesia the left leg was placed into a a stirrup brace to elevate heel off of the bed. The foot was then prepped and draped in the usual sterile fashion. Attention was then directed to the plantar and posterior aspect of the left heel where the eschar was identified and excisionally debrided using a 15 blade around the periphery to a healthy layer of skin. Gross debridement was carried down to the level of the inferior aspect of the calcaneus significant necrosis and foul odor with abscess was noted along the plantar tissues. This did extend tunneling beneath the distal flap of the ulceration as well as along the lateral aspect. Using a versa jet debrider on a setting of 7 the devitalized and necrotic soft tissues were debrided. There was noted to be some discoloration and necrosis of the plantar fascia at its insertion into the calcaneus and this was also debrided out using a versa jet. Following the debridement it appeared to have fairly good healthy bleeding tissues but there was some significant oozing from the tissues especially along the distal tunnel  area. Surgery flow with thrombin was then implanted into the wound to help control the bleeding. Next a Mepitel was placed across the wound and the inferior and distal and proximal aspects were sutured to the skin leaving the medial flap open. The Stimulan rapid cure antibiotic beads were then placed into the wound and then covered with a Mepitel which was then sutured around the medial aspect. All the sutures were accomplished using 4-0 nylon simple interrupted sutures. 4 x 4's fluffs ABDs Kerlix 2 and an Ace wrap were applied to the left lower extremity. The patient was awakened and transported to the PACU with vital signs stable and in good condition.

## 2015-08-28 NOTE — Clinical Social Work Note (Signed)
Clinical Social Work Assessment  Patient Details  Name: [name] MRN: [id number] Date of Birth: [birth year]  Date of referral:  08/28/15               Reason for consult:  Facility Placement                Permission sought to share information with:   (patient sleeping soundly this morning) Permission granted to share information::     Name::        Agency::     Relationship::     Contact Information:     Housing/Transportation Living arrangements for the past 2 months:  Skilled Nursing Facility Source of Information:  Spouse Patient Interpreter Needed:  None Criminal Activity/Legal Involvement Pertinent to Current Situation/Hospitalization:  No - Comment as needed Significant Relationships:  Spouse Lives with:  Facility Resident Do you feel safe going back to the place where you live?  Yes Need for family participation in patient care:  Yes (Comment)  Care giving concerns:  Patient is a long term care resident of [facility].   Social Worker assessment / plan: Patient sleeping soundly. CSW contacted Verlon AuLeslie at [facility] and she stated patient has been there for 2 years and that they will take patient back at discharge. CSW spoke with patient's wife via phone this morning and she stated that she has been pleased with patient's care at [facility] and wishes for him to return there at discharge. Patient's wife spoke with CSW regarding her battle with cancer and that the chemo she is on zaps her energy. CSW provided support. FL2 completed and in epic.  Employment status:  Retired Health and safety inspectornsurance information:  Medicare PT Recommendations:  Not assessed at this time Information / Referral to community resources:     Patient/Family's Response to care:  Patient's wife expressed appreciation for CSW call.  Patient/Family's Understanding of and Emotional Response to Diagnosis, Current Treatment, and Prognosis:  Patient's wife states that she is doing what she can but is  slowed down by her feeling tired due to chemo.  Emotional Assessment Appearance:  Appears stated age Attitude/Demeanor/Rapport:  Unable to Assess Affect (typically observed):  Unable to Assess Orientation:    Alcohol / Substance use:  Not Applicable Psych involvement (Current and /or in the community):  No (Comment)  Discharge Needs  Concerns to be addressed:  Care Coordination Readmission within the last 30 days:  No Current discharge risk:  None Barriers to Discharge:  No Barriers Identified   [name] SpanielMonica Syndey Jaskolski, LCSW 08/28/2015, 10:42 AM

## 2015-08-28 NOTE — Progress Notes (Signed)
SLP Cancellation Note  Patient Details Name: Gaynelle ArabianBurnice Lee Venhuizen MRN: [id number] DOB: 1935/06/24   Cancelled treatment:       Reason Eval/Treat Not Completed: Patient at procedure or test/unavailable Pt is currently NPO for procedure (debridement of hip). Will f/u re: toleration of diet in 1-2 days.    Eros,Vence Lalor 08/28/2015, 11:56 AM

## 2015-08-28 NOTE — Care Management Important Message (Signed)
Important Message  Patient Details  Name: [name] MRN: [id number] Date of Birth: 10/24/1934   Medicare Important Message Given:  Yes    Olegario MessierKathy A Manju Kulkarni 08/28/2015, 11:51 AM

## 2015-08-28 NOTE — Transfer of Care (Signed)
Immediate Anesthesia Transfer of Care Note  Patient: [name]  Procedure(s) Performed: Procedure(s): IRRIGATION AND DEBRIDEMENT LEFT HEEL, IMPLANTATION OF ANTIBIOTIC BEADS (Left)  Patient Location: PACU  Anesthesia Type:General  Level of Consciousness: sedated and responds to stimulation  Airway & Oxygen Therapy: Patient Spontanous Breathing and Patient connected to face mask oxygen  Post-op Assessment: Report given to RN and Post -op Vital signs reviewed and stable  Post vital signs: Reviewed and stable  Last Vitals:  Filed Vitals:   08/28/15 1220 08/28/15 1844  BP: 118/40 115/50  Pulse: 63 68  Temp: 36.6 C 36.5 C  Resp: 14 12    Complications: No apparent anesthesia complications

## 2015-08-28 NOTE — H&P (View-Only) (Signed)
Reason for Consult: Ulceration left heel Referring Physician: [facility]  [name] is an 80 y.o. male.  HPI: This patient was recently admitted with fever and chills and a malodorous ulcer beneath his left heel. Concern for sepsis with the heel ulcer being the most likely area of concern. The patient states this ulcers been present for approximately "2 weeks ". Had a revascularization procedure with a couple of stents placed about 2 weeks ago. Podiatry is consulted for debridement and management of the heel wound.  Past Medical History  Diagnosis Date  . Acute MI, lateral wall, initial episode of care ([place])   . ESRD on dialysis [facility])     on HD M/W/F  . Diabetes ([place])   . CAD (coronary artery disease) 06/25/2014    Mild LAD disease, circumflex 90%-->0% with  2.75 x 16 Promus DES, RCA subtotally occluded with left-right collaterals, EF 45-50% by echo    . HTN (hypertension)   . PVD (peripheral vascular disease) ([place])   . GERD (gastroesophageal reflux disease)     Past Surgical History  Procedure Laterality Date  . R leg surgery    . Cardiac catheterization  06/25/2014    NSTEMI s/p DES to LCx, subtotally occluded prox RCA  . Left heart catheterization with coronary angiogram N/A 06/25/2014    Procedure: LEFT HEART CATHETERIZATION WITH CORONARY ANGIOGRAM;  Surgeon: Jettie Booze, MD;  [facility]: [facility] CATH LAB;  Service: Cardiovascular;  Laterality: N/A;  . Percutaneous coronary stent intervention (pci-s)  06/25/2014    Procedure: PERCUTANEOUS CORONARY STENT INTERVENTION (PCI-S);  Surgeon: Jettie Booze, MD;  Location: [facility] CATH LAB;  Service: Cardiovascular;;  . Peripheral vascular catheterization N/A 06/03/2015    Procedure: A/V Shunt Intervention;  Surgeon: Katha Cabal, MD;  Location: [place] CV LAB;  Service: Cardiovascular;  Laterality: N/A;  . Peripheral vascular catheterization Left 06/03/2015    Procedure: A/V Shuntogram/Fistulagram;   Surgeon: Katha Cabal, MD;  Location: [place] CV LAB;  Service: Cardiovascular;  Laterality: Left;  . Peripheral vascular catheterization N/A 08/07/2015    Procedure: Abdominal Aortogram w/Lower Extremity;  Surgeon: Algernon Huxley, MD;  Location: [place] CV LAB;  Service: Cardiovascular;  Laterality: N/A;  . Peripheral vascular catheterization  08/07/2015    Procedure: Lower Extremity Intervention;  Surgeon: Algernon Huxley, MD;  Location: [place] CV LAB;  Service: Cardiovascular;;  . Peripheral vascular catheterization N/A 08/14/2015    Procedure: A/V Shuntogram/Fistulagram;  Surgeon: Algernon Huxley, MD;  Location: [place] CV LAB;  Service: Cardiovascular;  Laterality: N/A;    Family History  Problem Relation Age of Onset  . Bone cancer Father   . Hypertension Father   . Diabetes Mother   . Blindness Mother     Social History:  reports that he quit smoking about 59 years ago. He does not have any smokeless tobacco history on file. He reports that he does not drink alcohol or use illicit drugs.  Allergies:  Allergies  Allergen Reactions  . Contrast Media [Iodinated Diagnostic Agents] Hives  . Fish Allergy Hives    Shell fish    Medications: I have reviewed the patient's current medications.  Results for orders placed or performed during the hospital encounter of 08/26/15 (from the past 48 hour(s))  Comprehensive metabolic panel     Status: Abnormal   Collection Time: 08/26/15  7:15 PM  Result Value Ref Range   Sodium 136 135 - 145 mmol/L   Potassium  4.5 3.5 - 5.1 mmol/L    Comment: HEMOLYSIS AT THIS LEVEL MAY AFFECT RESULT   Chloride 92 (L) 101 - 111 mmol/L   CO2 31 22 - 32 mmol/L   Glucose, Bld 229 (H) 65 - 99 mg/dL   BUN 31 (H) 6 - 20 mg/dL   Creatinine, Ser 6.21 (H) 0.61 - 1.24 mg/dL   Calcium 8.3 (L) 8.9 - 10.3 mg/dL   Total Protein 8.2 (H) 6.5 - 8.1 g/dL   Albumin 2.6 (L) 3.5 - 5.0 g/dL   AST 19 15 - 41 U/L   ALT 14 (L) 17 - 63 U/L   Alkaline  Phosphatase 151 (H) 38 - 126 U/L   Total Bilirubin 0.8 0.3 - 1.2 mg/dL   GFR calc non Af Amer 8 (L) >60 mL/min   GFR calc Af Amer 9 (L) >60 mL/min    Comment: (NOTE) The eGFR has been calculated using the CKD EPI equation. This calculation has not been validated in all clinical situations. eGFR's persistently <60 mL/min signify possible Chronic Kidney Disease.    Anion gap 13 5 - 15  CBC with Differential/Platelet     Status: Abnormal   Collection Time: 08/26/15  7:15 PM  Result Value Ref Range   WBC 14.2 (H) 3.8 - 10.6 K/uL   RBC 3.64 (L) 4.40 - 5.90 MIL/uL   Hemoglobin 10.3 (L) 13.0 - 18.0 g/dL   HCT 32.3 (L) 40.0 - 52.0 %   MCV 88.6 80.0 - 100.0 fL   MCH 28.4 26.0 - 34.0 pg   MCHC 32.1 32.0 - 36.0 g/dL   RDW 15.6 (H) 11.5 - 14.5 %   Platelets 268 150 - 440 K/uL   Neutrophils Relative % 82 %   Neutro Abs 11.7 (H) 1.4 - 6.5 K/uL   Lymphocytes Relative 9 %   Lymphs Abs 1.3 1.0 - 3.6 K/uL   Monocytes Relative 9 %   Monocytes Absolute 1.2 (H) 0.2 - 1.0 K/uL   Eosinophils Relative 0 %   Eosinophils Absolute 0.0 0 - 0.7 K/uL   Basophils Relative 0 %   Basophils Absolute 0.1 0 - 0.1 K/uL  Hemoglobin A1c     Status: Abnormal   Collection Time: 08/26/15  7:15 PM  Result Value Ref Range   Hgb A1c MFr Bld 6.9 (H) 4.0 - 6.0 %  Lactic acid, plasma     Status: None   Collection Time: 08/26/15  7:25 PM  Result Value Ref Range   Lactic Acid, Venous 2.0 0.5 - 2.0 mmol/L  Lactic acid, plasma     Status: None   Collection Time: 08/26/15 10:50 PM  Result Value Ref Range   Lactic Acid, Venous 1.6 0.5 - 2.0 mmol/L  Glucose, capillary     Status: Abnormal   Collection Time: 08/26/15 11:24 PM  Result Value Ref Range   Glucose-Capillary 211 (H) 65 - 99 mg/dL   Comment 1 Notify RN   MRSA PCR Screening     Status: None   Collection Time: 08/26/15 11:43 PM  Result Value Ref Range   MRSA by PCR NEGATIVE NEGATIVE    Comment:        The GeneXpert MRSA Assay (FDA approved for NASAL  specimens only), is one component of a comprehensive MRSA colonization surveillance program. It is not intended to diagnose MRSA infection nor to guide or monitor treatment for MRSA infections.   Glucose, capillary     Status: Abnormal   Collection Time: 08/27/15  5:19  AM  Result Value Ref Range   Glucose-Capillary 122 (H) 65 - 99 mg/dL  Basic metabolic panel     Status: Abnormal   Collection Time: 08/27/15  5:42 AM  Result Value Ref Range   Sodium 139 135 - 145 mmol/L   Potassium 4.3 3.5 - 5.1 mmol/L   Chloride 94 (L) 101 - 111 mmol/L   CO2 31 22 - 32 mmol/L   Glucose, Bld 159 (H) 65 - 99 mg/dL   BUN 36 (H) 6 - 20 mg/dL   Creatinine, Ser 7.07 (H) 0.61 - 1.24 mg/dL   Calcium 8.5 (L) 8.9 - 10.3 mg/dL   GFR calc non Af Amer 7 (L) >60 mL/min   GFR calc Af Amer 8 (L) >60 mL/min    Comment: (NOTE) The eGFR has been calculated using the CKD EPI equation. This calculation has not been validated in all clinical situations. eGFR's persistently <60 mL/min signify possible Chronic Kidney Disease.    Anion gap 14 5 - 15  CBC     Status: Abnormal   Collection Time: 08/27/15  5:42 AM  Result Value Ref Range   WBC 10.9 (H) 3.8 - 10.6 K/uL   RBC 3.43 (L) 4.40 - 5.90 MIL/uL   Hemoglobin 9.8 (L) 13.0 - 18.0 g/dL   HCT 29.7 (L) 40.0 - 52.0 %   MCV 86.5 80.0 - 100.0 fL   MCH 28.6 26.0 - 34.0 pg   MCHC 33.1 32.0 - 36.0 g/dL   RDW 15.1 (H) 11.5 - 14.5 %   Platelets 249 150 - 440 K/uL  Protime-INR     Status: Abnormal   Collection Time: 08/27/15  5:42 AM  Result Value Ref Range   Prothrombin Time 16.7 (H) 11.4 - 15.0 seconds   INR 1.34   Glucose, capillary     Status: Abnormal   Collection Time: 08/27/15  7:46 AM  Result Value Ref Range   Glucose-Capillary 129 (H) 65 - 99 mg/dL  Glucose, capillary     Status: Abnormal   Collection Time: 08/27/15 11:53 AM  Result Value Ref Range   Glucose-Capillary 122 (H) 65 - 99 mg/dL    Dg Chest 2 View  08/26/2015  CLINICAL DATA:  Fever EXAM:  CHEST - 2 VIEW COMPARISON:  06/25/2014 FINDINGS: Cardiac shadow is within normal limits. The thoracic aorta is tortuous. The lungs are incompletely aerated. Multiple vascular stents are noted in the left arm. Elevation the right hemidiaphragm is again seen. Diffuse interstitial changes are noted without focal infiltrate. Degenerative change of the thoracic spine is noted. IMPRESSION: Chronic changes without acute abnormality. Electronically Signed   By: Inez Catalina M.D.   On: 08/26/2015 19:50    Review of Systems  Constitutional: Positive for fever and chills.  HENT: Negative.   Eyes: Negative.   Respiratory: Negative.   Cardiovascular: Negative.   Gastrointestinal: Negative.   Genitourinary: Negative.        On dialysis  Musculoskeletal: Negative.   Skin:       Ulceration on his left heel. Patient states it is been there "2 weeks"  Neurological:       Some numbness and paresthesias in the feet  Endo/Heme/Allergies: Negative.   Psychiatric/Behavioral: Negative.    Blood pressure 141/42, pulse 72, temperature 98.3 F (36.8 C), temperature source Oral, resp. rate 18, height _0  (1.753 m), weight 100.744 kg (222 lb 1.6 oz), SpO2 100 %. Physical Exam  Cardiovascular:  DP and PT pulses could not be clearly palpated  bilateral.  Musculoskeletal:  Stiff range of motion in the pedal joints. Muscle testing is deferred. 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  Neurological:  Some loss of protective sensation distally in the forefoot and toes.  Skin:  The skin is warm dry and somewhat atrophic with diminished hair growth. A large full-thickness ulceration with eschar and underlying fluctuant fluid beneath the left heel. Some drainage is noted from around the periphery. It measures approximately 9-10 cm x 5 cm. There is also noted to be a more superficial ulceration over the lateral malleolus area of the right ankle. Mostly granular base with some drainage. All of the toenails are  thick, dystrophic, discolored, brittle with subungual debris. Hyperkeratosis beneath the right forefoot and heel.    Assessment/Plan: Assessment: 1. Full-thickness ulceration left heel with abscess. 2. Peripheral vascular disease. 3. Diabetes with some degree of associated neuropathy.  Plan: We will obtain x-rays for evaluation of the bony structure of the heel up underneath the ulcerative area. Discussed with the patient and the family options for debridement. Discussed challenges for healing including his poor circulation and no guarantees could be given as to his healing potential. Discussed other options which would include amputation of the leg does not heal properly. They elect to proceed with surgery for debridement of the heel we'll plan this in the next day or 2.  Maybelline Kolarik W. 08/27/2015, 1:13 PM

## 2015-08-28 NOTE — Interval H&P Note (Signed)
History and Physical Interval Note:  08/28/2015 12:51 PM  [name]  has presented today for surgery, with the diagnosis of Left Heel Ulcer  The various methods of treatment have been discussed with the patient and family. After consideration of risks, benefits and other options for treatment, the patient has consented to  Procedure(s): IRRIGATION AND DEBRIDEMENT FOOT (Left) as a surgical intervention .  The patient's history has been reviewed, patient examined, no change in status, stable for surgery.  I have reviewed the patient's chart and labs.  Questions were answered to the patient's satisfaction.     Cythnia Osmun W.

## 2015-08-28 NOTE — Progress Notes (Signed)
[facility]  ROUNDING NOTE   Subjective:  Patient resting at bedside.  Had dialysis yesterday. Tolerated well yesterday. Significant   Objective:  Vital signs in last 24 hours:  Temp:  [97.9 F (36.6 C)-99.8 F (37.7 C)] 97.9 F (36.6 C) (12/29 1220) Pulse Rate:  [44-83] 63 (12/29 1220) Resp:  [11-20] 14 (12/29 1220) BP: (111-183)/(40-102) 118/40 mmHg (12/29 1220) SpO2:  [89 %-100 %] 100 % (12/29 1220) Weight:  [100.2 kg (220 lb 14.4 oz)-101 kg (222 lb 10.6 oz)] 100.2 kg (220 lb 14.4 oz) (12/29 0500)  Weight change: -7.863 kg (-17 lb 5.4 oz) Filed Weights   08/27/15 1830 08/27/15 2222 08/28/15 0500  Weight: 101 kg (222 lb 10.6 oz) 100.6 kg (221 lb 12.5 oz) 100.2 kg (220 lb 14.4 oz)    Intake/Output: I/O last 3 completed shifts: In: 1336 [P.O.:200; I.V.:1136] Out: 1000 [Other:1000]   Intake/Output this shift:  Total I/O In: 150 [IV Piggyback:150] Out: -   Physical Exam: General: NAD, resting in bed  Head: Normocephalic, atraumatic. Moist oral mucosal membranes  Eyes: Anicteric  Neck: Supple, trachea midline  Lungs:  Clear to auscultation normal effort  Heart: Regular rate and rhythm  Abdomen:  Soft, nontender, BS present  Extremities:  1+ peripheral edema, large eschar on left heel  Neurologic: Nonfocal, moving all four extremities  Skin: Eschar left heel  Access: LUE AVF    Basic Metabolic Panel:  Recent Labs Lab 08/26/15 1915 08/27/15 0542 08/27/15 1902  NA 136 139  --   K 4.5 4.3  --   CL 92* 94*  --   CO2 31 31  --   GLUCOSE 229* 159*  --   BUN 31* 36*  --   CREATININE 6.21* 7.07*  --   CALCIUM 8.3* 8.5*  --   PHOS  --   --  4.2    Liver Function Tests:  Recent Labs Lab 08/26/15 1915  AST 19  ALT 14*  ALKPHOS 151*  BILITOT 0.8  PROT 8.2*  ALBUMIN 2.6*   No results for input(s): LIPASE, AMYLASE in the last 168 hours. No results for input(s): AMMONIA in the last 168 hours.  CBC:  Recent Labs Lab 08/26/15 1915  08/27/15 0542  WBC 14.2* 10.9*  NEUTROABS 11.7*  --   HGB 10.3* 9.8*  HCT 32.3* 29.7*  MCV 88.6 86.5  PLT 268 249    Cardiac Enzymes: No results for input(s): CKTOTAL, CKMB, CKMBINDEX, TROPONINI in the last 168 hours.  BNP: Invalid input(s): POCBNP  CBG:  Recent Labs Lab 08/27/15 1153 08/27/15 1701 08/27/15 2248 08/28/15 0521 08/28/15 1149  GLUCAP 122* 204* 138* 148* 188*    Microbiology: Results for orders placed or performed during the hospital encounter of 08/26/15  Culture, blood (Routine X 2) w Reflex to ID Panel     Status: None (Preliminary result)   Collection Time: 08/26/15  8:58 PM  Result Value Ref Range Status   Specimen Description BLOOD RIGHT ANTECUBITAL  Final   Special Requests BOTTLES DRAWN AEROBIC AND ANAEROBIC 5ML  Final   Culture  Setup Time   Final    GRAM NEGATIVE RODS IN BOTH AEROBIC AND ANAEROBIC BOTTLES CRITICAL RESULT CALLED TO, READ BACK BY AND VERIFIED WITH: HENRY ZOMPA 08/27/15 1350 MLM    Culture PROTEUS SPECIES SUSCEPTIBILITIES TO FOLLOW   Final   Report Status PENDING  Incomplete  Blood Culture ID Panel (Reflexed)     Status: Abnormal   Collection Time: 08/26/15  8:58  PM  Result Value Ref Range Status   Enterococcus species NOT DETECTED NOT DETECTED Final   Listeria monocytogenes NOT DETECTED NOT DETECTED Final   Staphylococcus species NOT DETECTED NOT DETECTED Final   Staphylococcus aureus NOT DETECTED NOT DETECTED Final   Streptococcus species NOT DETECTED NOT DETECTED Final   Streptococcus agalactiae NOT DETECTED NOT DETECTED Final   Streptococcus pneumoniae NOT DETECTED NOT DETECTED Final   Streptococcus pyogenes NOT DETECTED NOT DETECTED Final   Acinetobacter baumannii NOT DETECTED NOT DETECTED Final   Enterobacteriaceae species DETECTED (A) NOT DETECTED Final    Comment: CRITICAL RESULT CALLED TO, READ BACK BY AND VERIFIED WITH: HENRY ZOMPA 08/27/15 1350 MLM    Enterobacter cloacae complex NOT DETECTED NOT DETECTED Final    Escherichia coli NOT DETECTED NOT DETECTED Final   Klebsiella oxytoca NOT DETECTED NOT DETECTED Final   Klebsiella pneumoniae NOT DETECTED NOT DETECTED Final   Proteus species DETECTED (A) NOT DETECTED Final    Comment: CRITICAL RESULT CALLED TO, READ BACK BY AND VERIFIED WITH: HENRY ZOMPA 08/27/15 1350 MLM    Serratia marcescens NOT DETECTED NOT DETECTED Final   Haemophilus influenzae NOT DETECTED NOT DETECTED Final   Neisseria meningitidis NOT DETECTED NOT DETECTED Final   Pseudomonas aeruginosa NOT DETECTED NOT DETECTED Final   Candida albicans NOT DETECTED NOT DETECTED Final   Candida glabrata NOT DETECTED NOT DETECTED Final   Candida krusei NOT DETECTED NOT DETECTED Final   Candida parapsilosis NOT DETECTED NOT DETECTED Final   Candida tropicalis NOT DETECTED NOT DETECTED Final   Carbapenem resistance NOT DETECTED NOT DETECTED Final   Methicillin resistance NOT DETECTED NOT DETECTED Final   Vancomycin resistance NOT DETECTED NOT DETECTED Final  MRSA PCR Screening     Status: None   Collection Time: 08/26/15 11:43 PM  Result Value Ref Range Status   MRSA by PCR NEGATIVE NEGATIVE Final    Comment:        The GeneXpert MRSA Assay (FDA approved for NASAL specimens only), is one component of a comprehensive MRSA colonization surveillance program. It is not intended to diagnose MRSA infection nor to guide or monitor treatment for MRSA infections.   Culture, blood (Routine X 2) w Reflex to ID Panel     Status: None (Preliminary result)   Collection Time: 08/27/15 12:30 AM  Result Value Ref Range Status   Specimen Description BLOOD LEFT HAND  Final   Special Requests BOTTLES DRAWN AEROBIC AND ANAEROBIC  Final   Culture NO GROWTH < 12 HOURS  Final   Report Status PENDING  Incomplete    Coagulation Studies:  Recent Labs  08/27/15 0542  LABPROT 16.7*  INR 1.34    Urinalysis: No results for input(s): COLORURINE, LABSPEC, PHURINE, GLUCOSEU, HGBUR, BILIRUBINUR,  KETONESUR, PROTEINUR, UROBILINOGEN, NITRITE, LEUKOCYTESUR in the last 72 hours.  Invalid input(s): APPERANCEUR    Imaging: Dg Chest 2 View  08/26/2015  CLINICAL DATA:  Fever EXAM: CHEST - 2 VIEW COMPARISON:  06/25/2014 FINDINGS: Cardiac shadow is within normal limits. The thoracic aorta is tortuous. The lungs are incompletely aerated. Multiple vascular stents are noted in the left arm. Elevation the right hemidiaphragm is again seen. Diffuse interstitial changes are noted without focal infiltrate. Degenerative change of the thoracic spine is noted. IMPRESSION: Chronic changes without acute abnormality. Electronically Signed   By: Alcide Clever M.D.   On: 08/26/2015 19:50   Dg Foot Complete Left  08/27/2015  CLINICAL DATA:  Necrotic ulcer of the left heel and history  of diabetes and peripheral vascular disease. EXAM: LEFT FOOT - COMPLETE 3+ VIEW COMPARISON:  None. FINDINGS: Lateral view shows soft tissue defect of the plantar aspect of the hindfoot without evidence of underlying bony destruction or fracture. No soft tissue foreign body is seen. Mild osteoarthritis noted of the toes. No focal bony lesions. IMPRESSION: Plantar hindfoot soft tissue defect likely represents the clinical ulcer. No underlying evidence of foreign body or obvious bony destruction to suggest osteomyelitis. Electronically Signed   By: Irish Lack M.D.   On: 08/27/2015 14:30     Medications:     . antiseptic oral rinse  7 mL Mouth Rinse q12n4p  . aspirin EC  81 mg Oral Daily  . chlorhexidine  15 mL Mouth Rinse BID  . cinacalcet  90 mg Oral Daily  . clopidogrel  75 mg Oral Daily  . heparin  5,000 Units Subcutaneous 3 times per day  . insulin aspart  0-9 Units Subcutaneous Q6H  . lanthanum  500 mg Oral TID WC  . losartan  12.5 mg Oral Daily  . metoprolol tartrate  12.5 mg Oral BID  . pantoprazole  40 mg Oral Daily  . piperacillin-tazobactam (ZOSYN)  IV  3.375 g Intravenous Q12H  . sevelamer carbonate  2.4 g Oral  TID WC  . sodium chloride  3 mL Intravenous Q12H  . vancomycin  1,000 mg Intravenous Q M,W,F-HD   sodium chloride, sodium chloride, acetaminophen **OR** acetaminophen, albuterol, alteplase, heparin, ipratropium-albuterol, lidocaine (PF), lidocaine-prilocaine, ondansetron **OR** ondansetron (ZOFRAN) IV, oxyCODONE, pentafluoroprop-tetrafluoroeth  Assessment/ Plan:  80 y.o. male past medical history of end-stage renal disease on hematemesis Monday, Wednesday, Friday, diabetes mellitus, coronary artery disease, hypertension, peripheral vascular disease, GERD, anemia chronic kidney disease, history of myocardial infarction, secondary hyperparathyroidism presented with fever and eschar on left heel.  1. End-stage renal disease on hematemesis Monday, Wednesday, Friday. The pt had HD yesterday, no acute indication for HD today, will plan for HD again tomorrow.  2. Anemia of chronic kidney disease. Hemoglobin currently 9.8. We will start the patient on Epogen 10,000 units IV with dialysis while here.  3. Secondary hyperparathyroidism. Phos 4.2, PTH 244, continue sensipar, renvela, and fosrenol.  4. Hypertension. Blood pressure 118/40, continue losartan and metoprolol.   5. Fever/left heel eschar. Heel appears to be the source of infection. Continue vancomycin and zosyn.    LOS: 2 Able Malloy 12/29/20161:24 PM

## 2015-08-28 NOTE — Anesthesia Postprocedure Evaluation (Signed)
Anesthesia Post Note  Patient: [name]  Procedure(s) Performed: Procedure(s) (LRB): IRRIGATION AND DEBRIDEMENT LEFT HEEL, IMPLANTATION OF ANTIBIOTIC BEADS (Left)  Patient location during evaluation: PACU Anesthesia Type: General Level of consciousness: awake Pain management: pain level controlled Vital Signs Assessment: post-procedure vital signs reviewed and stable Respiratory status: spontaneous breathing Cardiovascular status: blood pressure returned to baseline Anesthetic complications: no    Last Vitals:  Filed Vitals:   08/28/15 1930 08/28/15 1941  BP: 112/49 112/50  Pulse: 68   Temp:  36.5 C  Resp: 13     Last Pain:  Filed Vitals:   08/28/15 1943  PainSc: 2                  Arushi Partridge S

## 2015-08-28 NOTE — Progress Notes (Signed)
[facility] [place] [facility]   PATIENT NAME: [name] HampshireBurnice [name]    MR#:  [id number]  DATE OF BIRTH:  [birth year]  SUBJECTIVE:  CHIEF COMPLAINT:   Chief Complaint  Patient presents with  . Fever   patient is 80 year old African-American male with history of end-stage renal disease who is receiving hemodialysis via left upper extremity AV graft presents to the hospital with high fever. Patient was initiated on broad-spectrum antibiotics and admitted. Blood cultures revealed Proteus. MRSA was negative. Remains somnolent today, not able to review systems. For irrigation and debridement of left foot ulcer by Dr. Alberteen Spindleline today  Review of Systems  Constitutional: Positive for fever and chills. Negative for weight loss.  HENT: Negative for congestion.   Eyes: Negative for blurred vision and double vision.  Respiratory: Positive for cough and sputum production. Negative for shortness of breath and wheezing.   Cardiovascular: Negative for chest pain, palpitations, orthopnea, leg swelling and PND.  Gastrointestinal: Negative for nausea, vomiting, abdominal pain, diarrhea, constipation and blood in stool.  Genitourinary: Negative for dysuria, urgency, frequency and hematuria.  Musculoskeletal: Negative for falls.  Neurological: Negative for dizziness, tremors, focal weakness and headaches.  Endo/Heme/Allergies: Does not bruise/bleed easily.  Psychiatric/Behavioral: Negative for depression. The patient does not have insomnia.    complains of pain in left heel  VITAL SIGNS: Blood pressure 118/40, pulse 63, temperature 97.9 F (36.6 C), temperature source Oral, resp. rate 14, height 5\' 9"  (1.753 m), weight 100.2 kg (220 lb 14.4 oz), SpO2 100 %.  PHYSICAL EXAMINATION:   GENERAL:  80 y.o.-year-old patient lying in the bed with no acute distress. Some slurring of the speech and somnolence EYES: Pupils equal, round, reactive to light and accommodation. No scleral icterus.  Extraocular muscles intact.  HEENT: Head atraumatic, normocephalic. Oropharynx and nasopharynx clear.  NECK:  Supple, no jugular venous distention. No thyroid enlargement, no tenderness.  LUNGS: Normal breath sounds bilaterally, no wheezing, rales,rhonchi or crepitation. No use of accessory muscles of respiration.  CARDIOVASCULAR: S1, S2 normal. No murmurs, rubs, or gallops.  ABDOMEN: Soft, nontender, nondistended. Bowel sounds present. No organomegaly or mass.  EXTREMITIES: No pedal edema, cyanosis, or clubbing. Gangrenous left heel with no significant drainage, but malodorous scent NEUROLOGIC: Cranial nerves II through XII are intact. Muscle strength 5/5 in all extremities. Sensation intact. Gait not checked.  PSYCHIATRIC: The patient is alert and oriented x 3.  SKIN: No obvious rash, lesion, or ulcer.   ORDERS/RESULTS REVIEWED:   CBC  Recent Labs Lab 08/26/15 1915 08/27/15 0542  WBC 14.2* 10.9*  HGB 10.3* 9.8*  HCT 32.3* 29.7*  PLT 268 249  MCV 88.6 86.5  MCH 28.4 28.6  MCHC 32.1 33.1  RDW 15.6* 15.1*  LYMPHSABS 1.3  --   MONOABS 1.2*  --   EOSABS 0.0  --   BASOSABS 0.1  --    ------------------------------------------------------------------------------------------------------------------  Chemistries   Recent Labs Lab 08/26/15 1915 08/27/15 0542  NA 136 139  K 4.5 4.3  CL 92* 94*  CO2 31 31  GLUCOSE 229* 159*  BUN 31* 36*  CREATININE 6.21* 7.07*  CALCIUM 8.3* 8.5*  AST 19  --   ALT 14*  --   ALKPHOS 151*  --   BILITOT 0.8  --    ------------------------------------------------------------------------------------------------------------------ estimated creatinine clearance is 10 mL/min (by C-G formula based on Cr of 7.07). ------------------------------------------------------------------------------------------------------------------ No results for input(s): TSH, T4TOTAL, T3FREE, THYROIDAB in the last 72 hours.  Invalid input(s): FREET3  Cardiac  Enzymes No results for input(s): CKMB, TROPONINI, MYOGLOBIN in the last 168 hours.  Invalid input(s): CK ------------------------------------------------------------------------------------------------------------------ Invalid input(s): POCBNP ---------------------------------------------------------------------------------------------------------------  RADIOLOGY: Dg Chest 2 View  08/26/2015  CLINICAL DATA:  Fever EXAM: CHEST - 2 VIEW COMPARISON:  06/25/2014 FINDINGS: Cardiac shadow is within normal limits. The thoracic aorta is tortuous. The lungs are incompletely aerated. Multiple vascular stents are noted in the left arm. Elevation the right hemidiaphragm is again seen. Diffuse interstitial changes are noted without focal infiltrate. Degenerative change of the thoracic spine is noted. IMPRESSION: Chronic changes without acute abnormality. Electronically Signed   By: Alcide Clever M.D.   On: 08/26/2015 19:50   Dg Foot Complete Left  08/27/2015  CLINICAL DATA:  Necrotic ulcer of the left heel and history of diabetes and peripheral vascular disease. EXAM: LEFT FOOT - COMPLETE 3+ VIEW COMPARISON:  None. FINDINGS: Lateral view shows soft tissue defect of the plantar aspect of the hindfoot without evidence of underlying bony destruction or fracture. No soft tissue foreign body is seen. Mild osteoarthritis noted of the toes. No focal bony lesions. IMPRESSION: Plantar hindfoot soft tissue defect likely represents the clinical ulcer. No underlying evidence of foreign body or obvious bony destruction to suggest osteomyelitis. Electronically Signed   By: Irish Lack M.D.   On: 08/27/2015 14:30    EKG:  Orders placed or performed during the hospital encounter of 08/26/15  . EKG 12-Lead  . EKG 12-Lead    ASSESSMENT AND PLAN:  Principal Problem:   Sepsis (HCC) Active Problems:   ESRD (end stage renal disease) on dialysis (HCC)   CAD (coronary artery disease)   Gangrene of foot (HCC)   Type 2  diabetes mellitus (HCC)   GERD (gastroesophageal reflux disease) 1. Sepsis due to Proteus. Due to left heel gangrene/infected ulcer, continue broad-spectrum antibiotic therapy, following wound culture results, which I suspect could be multi bacterial. Patient is to undergo irrigation and debridement today by podiatry.  2. Left heel gangrene/infected ulcer/abscess, status post PTI by vascular surgery recently, now Plavix is on hold for irrigation and debridement of left foot ulcer , appreciate podiatry input, continue broad-spectrum antibiotics for now until wound cultures unknown 3. Leukocytosis, follow with antibiotic therapy 4. End-stage renal disease, nephrology consultation is obtained. Hemodialysis per nephrology 5. Diabetes mellitus type 2, continue outpatient therapy and sliding scale insulin, blood glucose ranges between 140s to 200s 6. Dysarthria, speech therapist and also further recommendations at bedside swallowing testing done. Initiate diet depending on speech recommendations, nothing by mouth for surgery  Management plans discussed with the patient, family and they are in agreement.   DRUG ALLERGIES:  Allergies  Allergen Reactions  . Contrast Media [Iodinated Diagnostic Agents] Hives  . Fish Allergy Hives    Shell fish    CODE STATUS:     Code Status Orders        Start     Ordered   08/26/15 2333  Full code   Continuous     08/26/15 2332      TOTAL TIME TAKING CARE OF THIS PATIENT: 40 minutes.    Katharina Caper M.D on 08/28/2015 at 2:05 PM  Between 7am to 6pm - Pager - [phone]  After 6pm go to [email or web address] - password [email or web address] [facility]  [place]  [facility]  Office  [phone]  CC: Primary care physician; No PCP Per Patient

## 2015-08-29 ENCOUNTER — Encounter: Payer: Self-pay | Admitting: Podiatry

## 2015-08-29 LAB — CBC
HCT: 25.8 % — ABNORMAL LOW (ref 40.0–52.0)
Hemoglobin: 8.4 g/dL — ABNORMAL LOW (ref 13.0–18.0)
MCH: 28.5 pg (ref 26.0–34.0)
MCHC: 32.7 g/dL (ref 32.0–36.0)
MCV: 87.1 fL (ref 80.0–100.0)
PLATELETS: 215 10*3/uL (ref 150–440)
RBC: 2.96 MIL/uL — ABNORMAL LOW (ref 4.40–5.90)
RDW: 15 % — AB (ref 11.5–14.5)
WBC: 9.2 10*3/uL (ref 3.8–10.6)

## 2015-08-29 LAB — RENAL FUNCTION PANEL
ALBUMIN: 2 g/dL — AB (ref 3.5–5.0)
ANION GAP: 13 (ref 5–15)
BUN: 37 mg/dL — ABNORMAL HIGH (ref 6–20)
CALCIUM: 7.2 mg/dL — AB (ref 8.9–10.3)
CO2: 27 mmol/L (ref 22–32)
Chloride: 95 mmol/L — ABNORMAL LOW (ref 101–111)
Creatinine, Ser: 7.98 mg/dL — ABNORMAL HIGH (ref 0.61–1.24)
GFR calc non Af Amer: 6 mL/min — ABNORMAL LOW (ref >60)
GFR, EST AFRICAN AMERICAN: 7 mL/min — AB (ref >60)
Glucose, Bld: 144 mg/dL — ABNORMAL HIGH (ref 65–99)
PHOSPHORUS: 4.2 mg/dL (ref 2.5–4.6)
Potassium: 4.4 mmol/L (ref 3.5–5.1)
SODIUM: 135 mmol/L (ref 135–145)

## 2015-08-29 LAB — GLUCOSE, CAPILLARY
GLUCOSE-CAPILLARY: 204 mg/dL — AB (ref 65–99)
GLUCOSE-CAPILLARY: 192 mg/dL — AB (ref 65–99)
Glucose-Capillary: 120 mg/dL — ABNORMAL HIGH (ref 65–99)
Glucose-Capillary: 124 mg/dL — ABNORMAL HIGH (ref 65–99)
Glucose-Capillary: 115 mg/dL — ABNORMAL HIGH (ref 65–99)
Glucose-Capillary: 187 mg/dL — ABNORMAL HIGH (ref 65–99)

## 2015-08-29 LAB — CULTURE, BLOOD (ROUTINE X 2)

## 2015-08-29 MED ORDER — NEPRO/CARBSTEADY PO LIQD
237.0000 mL | Freq: Two times a day (BID) | ORAL | Status: DC
  Administered 2015-08-30 – 2015-08-31 (×4): 237 mL via ORAL

## 2015-08-29 MED ORDER — INSULIN ASPART 100 UNIT/ML ~~LOC~~ SOLN
0.0000 [IU] | Freq: Three times a day (TID) | SUBCUTANEOUS | Status: DC
  Administered 2015-08-29: 3 [IU] via SUBCUTANEOUS
  Administered 2015-08-29 – 2015-09-01 (×9): 2 [IU] via SUBCUTANEOUS
  Filled 2015-08-29 (×7): qty 2
  Filled 2015-08-29: qty 3
  Filled 2015-08-29 (×2): qty 2

## 2015-08-29 MED ORDER — NEPRO/CARBSTEADY PO LIQD: 237.0000 mL | Freq: Every day | ORAL | Status: DC

## 2015-08-29 NOTE — Progress Notes (Signed)
[facility]  ROUNDING NOTE   Subjective:   Seen and examined on hemodialysis. Tolerating treatment well. UF goal of 1 litre  Objective:  Vital signs in last 24 hours:  Temp:  [97.3 F (36.3 C)-99.1 F (37.3 C)] 98.6 F (37 C) (12/30 0945) Pulse Rate:  [63-84] 75 (12/30 1100) Resp:  [10-17] 13 (12/30 1100) BP: (102-143)/(38-57) 122/49 mmHg (12/30 1100) SpO2:  [93 %-100 %] 94 % (12/30 0940) Weight:  [101.651 kg (224 lb 1.6 oz)-101.8 kg (224 lb 6.9 oz)] 101.8 kg (224 lb 6.9 oz) (12/30 0940)  Weight change: 0.651 kg (1 lb 7 oz) Filed Weights   08/28/15 0500 08/29/15 0249 08/29/15 0940  Weight: 100.2 kg (220 lb 14.4 oz) 101.651 kg (224 lb 1.6 oz) 101.8 kg (224 lb 6.9 oz)    Intake/Output: I/O last 3 completed shifts: In: 550 [I.V.:400; IV Piggyback:150] Out: 1050 [Other:1000; Blood:50]   Intake/Output this shift:     Physical Exam: General: NAD, resting in bed  Head: Normocephalic, atraumatic. Moist oral mucosal membranes  Eyes: Anicteric  Neck: Supple, trachea midline  Lungs:  Clear to auscultation normal effort  Heart: Regular rate and rhythm  Abdomen:  Soft, nontender, BS present  Extremities:  no peripheral edema, bilateral ankles in clean and dry dressings  Neurologic: Nonfocal, moving all four extremities  Skin: Eschar left heel  Access: LUE AVF    Basic Metabolic Panel:  Recent Labs Lab 08/26/15 1915 08/27/15 0542 08/27/15 1902  NA 136 139  --   K 4.5 4.3  --   CL 92* 94*  --   CO2 31 31  --   GLUCOSE 229* 159*  --   BUN 31* 36*  --   CREATININE 6.21* 7.07*  --   CALCIUM 8.3* 8.5*  --   PHOS  --   --  4.2    Liver Function Tests:  Recent Labs Lab 08/26/15 1915  AST 19  ALT 14*  ALKPHOS 151*  BILITOT 0.8  PROT 8.2*  ALBUMIN 2.6*   No results for input(s): LIPASE, AMYLASE in the last 168 hours. No results for input(s): AMMONIA in the last 168 hours.  CBC:  Recent Labs Lab 08/26/15 1915 08/27/15 0542  WBC 14.2* 10.9*   NEUTROABS 11.7*  --   HGB 10.3* 9.8*  HCT 32.3* 29.7*  MCV 88.6 86.5  PLT 268 249    Cardiac Enzymes: No results for input(s): CKTOTAL, CKMB, CKMBINDEX, TROPONINI in the last 168 hours.  BNP: Invalid input(s): POCBNP  CBG:  Recent Labs Lab 08/28/15 1634 08/28/15 1852 08/28/15 2300 08/29/15 0601 08/29/15 0806  GLUCAP 135* 119* 141* 120* 124*    Microbiology: Results for orders placed or performed during the hospital encounter of 08/26/15  Culture, blood (Routine X 2) w Reflex to ID Panel     Status: None   Collection Time: 08/26/15  8:58 PM  Result Value Ref Range Status   Specimen Description BLOOD RIGHT ANTECUBITAL  Final   Special Requests BOTTLES DRAWN AEROBIC AND ANAEROBIC  Final   Culture  Setup Time   Final    GRAM NEGATIVE RODS IN BOTH AEROBIC AND ANAEROBIC BOTTLES CRITICAL RESULT CALLED TO, READ BACK BY AND VERIFIED WITH: HENRY ZOMPA 08/27/15 1350 MLM    Culture PROTEUS MIRABILIS  Final   Report Status 08/29/2015 FINAL  Final   Organism ID, Bacteria PROTEUS MIRABILIS  Final      Susceptibility   Proteus mirabilis - MIC*    AMPICILLIN <=2  SENSITIVE Sensitive     CEFTAZIDIME <=1 SENSITIVE Sensitive     CEFAZOLIN <=4 SENSITIVE Sensitive     CEFTRIAXONE <=1 SENSITIVE Sensitive     CIPROFLOXACIN <=0.25 SENSITIVE Sensitive     GENTAMICIN <=1 SENSITIVE Sensitive     IMIPENEM 2 SENSITIVE Sensitive     TRIMETH/SULFA <=20 SENSITIVE Sensitive     PIP/TAZO Value in next row Sensitive      SENSITIVE<=4    * PROTEUS MIRABILIS  Blood Culture ID Panel (Reflexed)     Status: Abnormal   Collection Time: 08/26/15  8:58 PM  Result Value Ref Range Status   Enterococcus species NOT DETECTED NOT DETECTED Final   Listeria monocytogenes NOT DETECTED NOT DETECTED Final   Staphylococcus species NOT DETECTED NOT DETECTED Final   Staphylococcus aureus NOT DETECTED NOT DETECTED Final   Streptococcus species NOT DETECTED NOT DETECTED Final   Streptococcus agalactiae NOT  DETECTED NOT DETECTED Final   Streptococcus pneumoniae NOT DETECTED NOT DETECTED Final   Streptococcus pyogenes NOT DETECTED NOT DETECTED Final   Acinetobacter baumannii NOT DETECTED NOT DETECTED Final   Enterobacteriaceae species DETECTED (A) NOT DETECTED Final    Comment: CRITICAL RESULT CALLED TO, READ BACK BY AND VERIFIED WITH: HENRY ZOMPA 08/27/15 1350 MLM    Enterobacter cloacae complex NOT DETECTED NOT DETECTED Final   Escherichia coli NOT DETECTED NOT DETECTED Final   Klebsiella oxytoca NOT DETECTED NOT DETECTED Final   Klebsiella pneumoniae NOT DETECTED NOT DETECTED Final   Proteus species DETECTED (A) NOT DETECTED Final    Comment: CRITICAL RESULT CALLED TO, READ BACK BY AND VERIFIED WITH: HENRY ZOMPA 08/27/15 1350 MLM    Serratia marcescens NOT DETECTED NOT DETECTED Final   Haemophilus influenzae NOT DETECTED NOT DETECTED Final   Neisseria meningitidis NOT DETECTED NOT DETECTED Final   Pseudomonas aeruginosa NOT DETECTED NOT DETECTED Final   Candida albicans NOT DETECTED NOT DETECTED Final   Candida glabrata NOT DETECTED NOT DETECTED Final   Candida krusei NOT DETECTED NOT DETECTED Final   Candida parapsilosis NOT DETECTED NOT DETECTED Final   Candida tropicalis NOT DETECTED NOT DETECTED Final   Carbapenem resistance NOT DETECTED NOT DETECTED Final   Methicillin resistance NOT DETECTED NOT DETECTED Final   Vancomycin resistance NOT DETECTED NOT DETECTED Final  MRSA PCR Screening     Status: None   Collection Time: 08/26/15 11:43 PM  Result Value Ref Range Status   MRSA by PCR NEGATIVE NEGATIVE Final    Comment:        The GeneXpert MRSA Assay (FDA approved for NASAL specimens only), is one component of a comprehensive MRSA colonization surveillance program. It is not intended to diagnose MRSA infection nor to guide or monitor treatment for MRSA infections.   Culture, blood (Routine X 2) w Reflex to ID Panel     Status: None (Preliminary result)   Collection  Time: 08/27/15 12:30 AM  Result Value Ref Range Status   Specimen Description BLOOD LEFT HAND  Final   Special Requests BOTTLES DRAWN AEROBIC AND ANAEROBIC  Final   Culture NO GROWTH 1 DAY  Final   Report Status PENDING  Incomplete  Wound culture     Status: None (Preliminary result)   Collection Time: 08/28/15  5:43 PM  Result Value Ref Range Status   Specimen Description HEEL  Final   Special Requests NONE  Final   Gram Stain PENDING  Incomplete   Culture HOLDING FOR POSSIBLE PATHOGEN  Final   Report Status  PENDING  Incomplete    Coagulation Studies:  Recent Labs  08/27/15 0542  LABPROT 16.7*  INR 1.34    Urinalysis: No results for input(s): COLORURINE, LABSPEC, PHURINE, GLUCOSEU, HGBUR, BILIRUBINUR, KETONESUR, PROTEINUR, UROBILINOGEN, NITRITE, LEUKOCYTESUR in the last 72 hours.  Invalid input(s): APPERANCEUR    Imaging: Dg Foot Complete Left  08/27/2015  CLINICAL DATA:  Necrotic ulcer of the left heel and history of diabetes and peripheral vascular disease. EXAM: LEFT FOOT - COMPLETE 3+ VIEW COMPARISON:  None. FINDINGS: Lateral view shows soft tissue defect of the plantar aspect of the hindfoot without evidence of underlying bony destruction or fracture. No soft tissue foreign body is seen. Mild osteoarthritis noted of the toes. No focal bony lesions. IMPRESSION: Plantar hindfoot soft tissue defect likely represents the clinical ulcer. No underlying evidence of foreign body or obvious bony destruction to suggest osteomyelitis. Electronically Signed   By: Irish LackGlenn  Yamagata M.D.   On: 08/27/2015 14:30     Medications:     . antiseptic oral rinse  7 mL Mouth Rinse q12n4p  . aspirin EC  81 mg Oral Daily  . chlorhexidine  15 mL Mouth Rinse BID  . cinacalcet  90 mg Oral Daily  . clopidogrel  75 mg Oral Daily  . epoetin (EPOGEN/PROCRIT) injection  10,000 Units Intravenous Q M,W,F-HD  . heparin  5,000 Units Subcutaneous 3 times per day  . insulin aspart  0-9 Units  Subcutaneous Q6H  . lanthanum  500 mg Oral TID WC  . losartan  12.5 mg Oral Daily  . metoprolol tartrate  12.5 mg Oral BID  . pantoprazole  40 mg Oral Daily  . piperacillin-tazobactam (ZOSYN)  IV  3.375 g Intravenous Q12H  . sevelamer carbonate  2.4 g Oral TID WC  . sodium chloride  3 mL Intravenous Q12H  . sodium chloride  3 mL Intravenous Q12H  . vancomycin  1,000 mg Intravenous Q M,W,F-HD   sodium chloride, sodium chloride, sodium chloride, acetaminophen **OR** acetaminophen, albuterol, alteplase, heparin, ipratropium-albuterol, lidocaine (PF), lidocaine-prilocaine, morphine injection, morphine injection, ondansetron **OR** ondansetron (ZOFRAN) IV, oxyCODONE, oxyCODONE-acetaminophen, pentafluoroprop-tetrafluoroeth, sodium chloride  Assessment/ Plan:  80 y.o. black male past medical history of end-stage renal disease on hematemesis Monday, Wednesday, Friday, diabetes mellitus, coronary artery disease, hypertension, peripheral vascular disease, GERD, anemia chronic kidney disease, history of myocardial infarction, secondary hyperparathyroidism presented with fever and eschar on left heel.  1. End-stage renal disease on hemodialysis Monday, Wednesday, Friday.  Seen and examined on hemodialysis. Tolerating treatment well. UF goal of 1 litre Continue MWF schedule.   2. Anemia of chronic kidney disease. Hemoglobin currently 9.8.  - epo with treatment  3. Secondary hyperparathyroidism. Phos 4.2, PTH 244 - continue sensipar, renvela, and fosrenol.  4. Hypertension. Blood pressure at goal -  continue losartan and metoprolol.   5. Bacteremia and chronic foot ulcer: blood culture growing proteus mirabilis.  - pip/tazo and vanco   LOS: 3 Michiah Masse 12/30/201611:32 AM

## 2015-08-29 NOTE — Progress Notes (Addendum)
ANTIBIOTIC CONSULT NOTE - FOLLOW UP   Pharmacy Consult for Vancomycina nd Zosyn dosing Indication: sepsis secondary Proteus and  Enterobacteriae bacteremia and gangrene left heel ulcer   Allergies  Allergen Reactions  . Contrast Media [Iodinated Diagnostic Agents] Hives  . Fish Allergy Hives    Shell fish    Patient Measurements: Height:  (175.3 cm) Weight: 224 lb 1.6 oz (101.651 kg) IBW/kg (Calculated) : 70.7 Adjusted Body Weight: 82kg  Vital Signs: Temp: 98.7 F (37.1 C) (12/30 0602) Temp Source: Oral (12/30 0602) BP: 102/45 mmHg (12/30 0602) Pulse Rate: 82 (12/30 0602) Intake/Output from previous day: 12/29 0701 - 12/30 0700 In: 550 [I.V.:400; IV Piggyback:150] Out: 50 [Blood:50] Intake/Output from this shift:    Labs:  Recent Labs  08/26/15 1915 08/27/15 0542  WBC 14.2* 10.9*  HGB 10.3* 9.8*  PLT 268 249  CREATININE 6.21* 7.07*   Estimated Creatinine Clearance: 10.1 mL/min (by C-G formula based on Cr of 7.07). No results for input(s): VANCOTROUGH, VANCOPEAK, VANCORANDOM, GENTTROUGH, GENTPEAK, GENTRANDOM, TOBRATROUGH, TOBRAPEAK, TOBRARND, AMIKACINPEAK, AMIKACINTROU, AMIKACIN in the last 72 hours.   Microbiology: Recent Results (from the past 720 hour(s))  Culture, blood (Routine X 2) w Reflex to ID Panel     Status: None   Collection Time: 08/26/15  8:58 PM  Result Value Ref Range Status   Specimen Description BLOOD RIGHT ANTECUBITAL  Final   Special Requests BOTTLES DRAWN AEROBIC AND ANAEROBIC  Final   Culture  Setup Time   Final    GRAM NEGATIVE RODS IN BOTH AEROBIC AND ANAEROBIC BOTTLES CRITICAL RESULT CALLED TO, READ BACK BY AND VERIFIED WITH: HENRY ZOMPA 08/27/15 1350 MLM    Culture PROTEUS MIRABILIS  Final   Report Status 08/29/2015 FINAL  Final   Organism ID, Bacteria PROTEUS MIRABILIS  Final      Susceptibility   Proteus mirabilis - MIC*    AMPICILLIN <=2 SENSITIVE Sensitive     CEFTAZIDIME <=1 SENSITIVE Sensitive     CEFAZOLIN <=4  SENSITIVE Sensitive     CEFTRIAXONE <=1 SENSITIVE Sensitive     CIPROFLOXACIN <=0.25 SENSITIVE Sensitive     GENTAMICIN <=1 SENSITIVE Sensitive     IMIPENEM 2 SENSITIVE Sensitive     TRIMETH/SULFA <=20 SENSITIVE Sensitive     PIP/TAZO Value in next row Sensitive      SENSITIVE<=4    * PROTEUS MIRABILIS  Blood Culture ID Panel (Reflexed)     Status: Abnormal   Collection Time: 08/26/15  8:58 PM  Result Value Ref Range Status   Enterococcus species NOT DETECTED NOT DETECTED Final   Listeria monocytogenes NOT DETECTED NOT DETECTED Final   Staphylococcus species NOT DETECTED NOT DETECTED Final   Staphylococcus aureus NOT DETECTED NOT DETECTED Final   Streptococcus species NOT DETECTED NOT DETECTED Final   Streptococcus agalactiae NOT DETECTED NOT DETECTED Final   Streptococcus pneumoniae NOT DETECTED NOT DETECTED Final   Streptococcus pyogenes NOT DETECTED NOT DETECTED Final   Acinetobacter baumannii NOT DETECTED NOT DETECTED Final   Enterobacteriaceae species DETECTED (A) NOT DETECTED Final    Comment: CRITICAL RESULT CALLED TO, READ BACK BY AND VERIFIED WITH: HENRY ZOMPA 08/27/15 1350 MLM    Enterobacter cloacae complex NOT DETECTED NOT DETECTED Final   Escherichia coli NOT DETECTED NOT DETECTED Final   Klebsiella oxytoca NOT DETECTED NOT DETECTED Final   Klebsiella pneumoniae NOT DETECTED NOT DETECTED Final   Proteus species DETECTED (A) NOT DETECTED Final    Comment: CRITICAL RESULT CALLED TO, READ BACK  BY AND VERIFIED WITH: HENRY ZOMPA 08/27/15 1350 MLM    Serratia marcescens NOT DETECTED NOT DETECTED Final   Haemophilus influenzae NOT DETECTED NOT DETECTED Final   Neisseria meningitidis NOT DETECTED NOT DETECTED Final   Pseudomonas aeruginosa NOT DETECTED NOT DETECTED Final   Candida albicans NOT DETECTED NOT DETECTED Final   Candida glabrata NOT DETECTED NOT DETECTED Final   Candida krusei NOT DETECTED NOT DETECTED Final   Candida parapsilosis NOT DETECTED NOT DETECTED  Final   Candida tropicalis NOT DETECTED NOT DETECTED Final   Carbapenem resistance NOT DETECTED NOT DETECTED Final   Methicillin resistance NOT DETECTED NOT DETECTED Final   Vancomycin resistance NOT DETECTED NOT DETECTED Final  MRSA PCR Screening     Status: None   Collection Time: 08/26/15 11:43 PM  Result Value Ref Range Status   MRSA by PCR NEGATIVE NEGATIVE Final    Comment:        The GeneXpert MRSA Assay (FDA approved for NASAL specimens only), is one component of a comprehensive MRSA colonization surveillance program. It is not intended to diagnose MRSA infection nor to guide or monitor treatment for MRSA infections.   Culture, blood (Routine X 2) w Reflex to ID Panel     Status: None (Preliminary result)   Collection Time: 08/27/15 12:30 AM  Result Value Ref Range Status   Specimen Description BLOOD LEFT HAND  Final   Special Requests BOTTLES DRAWN AEROBIC AND ANAEROBIC 5ML  Final   Culture NO GROWTH 1 DAY  Final   Report Status PENDING  Incomplete  Wound culture     Status: None (Preliminary result)   Collection Time: 08/28/15  5:43 PM  Result Value Ref Range Status   Specimen Description HEEL  Final   Special Requests NONE  Final   Gram Stain PENDING  Incomplete   Culture HOLDING FOR POSSIBLE PATHOGEN  Final   Report Status PENDING  Incomplete    Medical History: Past Medical History  Diagnosis Date  . Acute MI, lateral wall, initial episode of care (HCC)   . ESRD on dialysis [facility](HCC)     on HD M/W/F  . Diabetes (HCC)   . CAD (coronary artery disease) 06/25/2014    Mild LAD disease, circumflex 90%-->0% with  2.75 x 16 Promus DES, RCA subtotally occluded with left-right collaterals, EF 45-50% by echo    . HTN (hypertension)   . PVD (peripheral vascular disease) (HCC)   . GERD (gastroesophageal reflux disease)     Medications:    Assessment: CXR: no acute disease  Goal of Therapy:    Plan:  TBW 99.5kg  IBW 70.7kg  DW 82kg Patient receiving  Vancomycin 1 g IV 60 minutes prior to the end of each dialysis session. Patient scheduled to received hemodialysis today 12/30. Trough level order to be drawn prior to the 3rd dialysis session which is scheduled for 08/31/14.   Zosyn 3.375 grams q 12 hours ordered.   Tonilynn Bieker D 08/29/2015,9:36 AM

## 2015-08-29 NOTE — Progress Notes (Signed)
Pt tolerated HD tx. Vitals remained stable throughout tx. Denies SOB or any other discomforts at this time. UF 1 liter as prescribed. Monitor acces for bleeding, remove guaze in 4hours. Report given to floor RN. Transported back to room via transport. Pt stable.

## 2015-08-29 NOTE — Progress Notes (Signed)
Initial Nutrition Assessment    INTERVENTION:   Meals and Snacks: Cater to patient preferences Medical Food Supplement Therapy: recommend addition of Nepro BID to better meet nutritional needs   NUTRITION DIAGNOSIS:   Increased nutrient needs related to chronic illness, wound healing as evidenced by estimated needs.  GOAL:   Patient will meet greater than or equal to 90% of their needs   MONITOR:    (Energy Intake, Anthropometrics, Electrolyte/Renal Profile, Glucose Profile)  REASON FOR ASSESSMENT:   LOS    ASSESSMENT:    Pt admitted with gangrenous ulceration of left heel abscess s/p debridement; pt down in dialysis on visit this AM  Past Medical History  Diagnosis Date  . Acute MI, lateral wall, initial episode of care (HCC)   . ESRD on dialysis [facility](HCC)     on HD M/W/F  . Diabetes (HCC)   . CAD (coronary artery disease) 06/25/2014    Mild LAD disease, circumflex 90%-->0% with  2.75 x 16 Promus DES, RCA subtotally occluded with left-right collaterals, EF 45-50% by echo    . HTN (hypertension)   . PVD (peripheral vascular disease) (HCC)   . GERD (gastroesophageal reflux disease)      Diet Order:  Diet renal/carb modified with fluid restriction Diet-HS Snack?: Nothing; Room service appropriate?: Yes; Fluid consistency:: Thin  Energy Intake: recorded po intake 0%, noted previously NPO for surgery , diet just advanced this AM and then pt went to HD  Electrolyte and Renal Profile:  Recent Labs Lab 08/26/15 1915 08/27/15 0542 08/27/15 1902 08/29/15 1125  BUN 31* 36*  --  37*  CREATININE 6.21* 7.07*  --  7.98*  NA 136 139  --  135  K 4.5 4.3  --  4.4  PHOS  --   --  4.2 4.2   Glucose Profile:   Recent Labs  08/29/15 0601 08/29/15 0806 08/29/15 1328  GLUCAP 120* 124* 115*   Meds: fosrenoal, ss novolog, sensipar  Food and nutrition related history:  Unable to complete Nutrition-Focused physical exam at this time.    Height:   Ht Readings from Last  1 Encounters:  08/26/15 5\' 9"  (1.753 m)    Weight: wt stable as per weight encounters  Wt Readings from Last 1 Encounters:  08/29/15 222 lb 0.1 oz (100.7 kg)    Filed Weights   08/29/15 0249 08/29/15 0940 08/29/15 1300  Weight: 224 lb 1.6 oz (101.651 kg) 224 lb 6.9 oz (101.8 kg) 222 lb 0.1 oz (100.7 kg)   Wt Readings from Last 10 Encounters:  08/29/15 222 lb 0.1 oz (100.7 kg)  08/14/15 228 lb (103.42 kg)  08/07/15 228 lb (103.42 kg)  06/03/15 230 lb (104.327 kg)  06/02/15 230 lb (104.327 kg)  01/15/15 208 lb (94.348 kg)  06/28/14 215 lb 13.3 oz (97.9 kg)    BMI:  Body mass index is 32.77 kg/(m^2).  Estimated Nutritional Needs:   Kcal:  [id number] kcals (BEE 1435, 1.3 AF, 1.2-1.3 IF) using IBW 73 kg  Protein:  95-117 g (1.3-1.6 g/kg)   Fluid:  1000 mL plus UOP  EDUCATION NEEDS:    MODERATE Care Level  Romelle StarcherCate Jeromy Borcherding MS, RD, LDN [phone] Pager  [phone] Weekend/On-Call Pager

## 2015-08-29 NOTE — Progress Notes (Signed)
[facility] - [place] at [facility]   PATIENT NAME: [name] HampshireBurnice [name]    MR#:  [id number]  DATE OF BIRTH:  02-09-1935  SUBJECTIVE:  CHIEF COMPLAINT:   Chief Complaint  Patient presents with  . Fever    - Patient complaining of left heel pain. End-stage renal patient admitted for PAD left heel ulceration for which he had debridement done in the OR on 08/28/2015 -Blood cultures on admission growing Proteus and Enterobacter. -Wound cultures from the OR yesterday are still pending. -Patient is afebrile. WBC is improved  REVIEW OF SYSTEMS:  Review of Systems  Constitutional: Positive for chills. Negative for fever and weight loss.  HENT: Negative for congestion.   Eyes: Negative for blurred vision and double vision.  Respiratory: Positive for cough and sputum production. Negative for shortness of breath and wheezing.   Cardiovascular: Negative for chest pain, palpitations, orthopnea, leg swelling and PND.  Gastrointestinal: Negative for nausea, vomiting, abdominal pain, diarrhea, constipation and blood in stool.  Genitourinary: Negative for dysuria, urgency, frequency and hematuria.  Musculoskeletal: Negative for falls.  Neurological: Negative for dizziness, tremors, focal weakness and headaches.  Endo/Heme/Allergies: Does not bruise/bleed easily.  Psychiatric/Behavioral: Negative for depression. The patient does not have insomnia.    complains of pain in left heel   VITAL SIGNS:  VITAL SIGNS: Blood pressure 137/63, pulse 82, temperature 98.2 F (36.8 C), temperature source Oral, resp. rate 12, height 5\' 9"  (1.753 m), weight 100.7 kg (222 lb 0.1 oz), SpO2 94 %.  PHYSICAL EXAMINATION:   GENERAL:  80 y.o.-year-old patient lying in the bed with no acute distress. More alert and communicating today. EYES: Pupils equal, round, reactive to light and accommodation. No scleral icterus. Extraocular muscles intact.  HEENT: Head atraumatic, normocephalic. Oropharynx  and nasopharynx clear.  NECK:  Supple, no jugular venous distention. No thyroid enlargement, no tenderness.  LUNGS: Normal breath sounds bilaterally, no wheezing, rales,rhonchi or crepitation. No use of accessory muscles of respiration. Decreased bibasilar breath sounds noted CARDIOVASCULAR: S1, S2 normal. No murmurs, rubs, or gallops.  ABDOMEN: Soft, nontender, nondistended. Bowel sounds present. No organomegaly or mass.  EXTREMITIES: No pedal edema, cyanosis, or clubbing. Right heel in dressing. Left heel with dressing from surgery with Ace wrap around it.  Unable to palpate dorsalis pedis pulses in both feet NEUROLOGIC: Cranial nerves II through XII are intact. Muscle strength 5/5 in all extremities. Sensation intact. Gait not checked.  PSYCHIATRIC: The patient is alert and oriented x 3.  SKIN: No obvious rash, lesion, or ulcer.   ORDERS/RESULTS REVIEWED:   CBC  Recent Labs Lab 08/26/15 1915 08/27/15 0542 08/29/15 1125  WBC 14.2* 10.9* 9.2  HGB 10.3* 9.8* 8.4*  HCT 32.3* 29.7* 25.8*  PLT 268 249 215  MCV 88.6 86.5 87.1  MCH 28.4 28.6 28.5  MCHC 32.1 33.1 32.7  RDW 15.6* 15.1* 15.0*  LYMPHSABS 1.3  --   --   MONOABS 1.2*  --   --   EOSABS 0.0  --   --   BASOSABS 0.1  --   --    ------------------------------------------------------------------------------------------------------------------  Chemistries   Recent Labs Lab 08/26/15 1915 08/27/15 0542 08/29/15 1125  NA 136 139 135  K 4.5 4.3 4.4  CL 92* 94* 95*  CO2 31 31 27   GLUCOSE 229* 159* 144*  BUN 31* 36* 37*  CREATININE 6.21* 7.07* 7.98*  CALCIUM 8.3* 8.5* 7.2*  AST 19  --   --   ALT 14*  --   --  ALKPHOS 151*  --   --   BILITOT 0.8  --   --    ------------------------------------------------------------------------------------------------------------------ estimated creatinine clearance is 8.9 mL/min (by C-G formula based on Cr of  7.98). ------------------------------------------------------------------------------------------------------------------ No results for input(s): TSH, T4TOTAL, T3FREE, THYROIDAB in the last 72 hours.  Invalid input(s): FREET3  Cardiac Enzymes No results for input(s): CKMB, TROPONINI, MYOGLOBIN in the last 168 hours.  Invalid input(s): CK ------------------------------------------------------------------------------------------------------------------ Invalid input(s): POCBNP ---------------------------------------------------------------------------------------------------------------  RADIOLOGY: No results found.  EKG:  Orders placed or performed during the hospital encounter of 08/26/15  . EKG 12-Lead  . EKG 12-Lead    ASSESSMENT AND PLAN:  Principal Problem:   Sepsis (HCC) Active Problems:   ESRD (end stage renal disease) on dialysis (HCC)   CAD (coronary artery disease)   Gangrene of foot (HCC)   Type 2 diabetes mellitus (HCC)   GERD (gastroesophageal reflux disease)  1. Sepsis due to Proteus bacteremia-. Likely source is the left lower extremity gangrenous ulcer. -Appreciate podiatry consult. -Due to left heel gangrene/infected ulcer, continue broad-spectrum antibiotic therapy while waiting for the wound culture results from debridement yesterday - Vancomycin can be discontinued once cultures remain negative by tomorrow - Due to deep infection noted- check with podiatry about need for IV ABX at discharge - Dressing changes and weight bearing per podiatry - patient is usually bed bound at baseline/ wheelchair bound.  2. PAD with Left heel gangrene/infected ulcer - status post PCI by vascular surgery 2 weeks ago - now Plavix is on hold for irrigation and debridement of left foot ulcer - Restart plavix - appreciate podiatry input, continue broad-spectrum antibiotics for now until wound cultures unknown  3. End-stage renal disease: om M-W-F hemodialysis, nephrology  consultation is obtained. Hemodialysis per schedule - had dialysis today  4. Diabetes mellitus type 2, continue outpatient therapy and sliding scale insulin,   5.HTN- losartan and metoprolol- low doses  6. Anemia of chronic disease- on epogen with dialysis. Stable  7. DVT Prophylaxis- on heparin SQ  Management plans discussed with the patient, family (wife and son )and they are in agreement. Patient on 2L o2- wean as tolerated as not on home o2. Use incentive spirometry as needed.  Patient is from [facility] long term care.   DRUG ALLERGIES:  Allergies  Allergen Reactions  . Contrast Media [Iodinated Diagnostic Agents] Hives  . Fish Allergy Hives    Shell fish    CODE STATUS:     Code Status Orders        Start     Ordered   08/26/15 2333  Full code   Continuous     08/26/15 2332      TOTAL TIME SPENT IN TAKING CARE OF THIS PATIENT: 38 minutes.    Suheily Birks M.D on 08/29/2015 at 4:19 PM  Between 7am to 6pm - Pager - [phone]  After 6pm go to [email or web address] - password [email or web address] [facility]  [place] [facility]  Office  [phone]  CC: Primary care physician; No PCP Per Patient

## 2015-08-29 NOTE — Progress Notes (Signed)
RN taught pt how to use incentive spirometer. Pt demonstrated teach back. Will continue to encourage usage.  [name]

## 2015-08-29 NOTE — Progress Notes (Signed)
Dr. Clint GuyHower notified that patient blood pressures have been running low. No new orders given. Will continue to monitor.

## 2015-08-29 NOTE — Progress Notes (Signed)
1 Day Post-Op  Subjective: Patient seen. States that currently he is not having any pain with his left heel. Doing well  Objective: Vital signs in last 24 hours: Temp:  [98 F (36.7 C)-99.1 F (37.3 C)] 98.2 F (36.8 C) (12/30 1343) Pulse Rate:  [70-84] 82 (12/30 1343) Resp:  [11-17] 12 (12/30 1300) BP: (83-137)/(38-89) 137/63 mmHg (12/30 1343) SpO2:  [93 %-98 %] 94 % (12/30 0940) Weight:  [100.7 kg (222 lb 0.1 oz)-101.8 kg (224 lb 6.9 oz)] 100.7 kg (222 lb 0.1 oz) (12/30 1300) Last BM Date: 08/26/15  Intake/Output from previous day: 12/29 0701 - 12/30 0700 In: 550 [I.V.:400; IV Piggyback:150] Out: 50 [Blood:50] Intake/Output this shift:    The bandage on the left foot is dry and intact. No evidence of strike through on the bandage or Ace wrap.  Lab Results:   Recent Labs  08/27/15 0542 08/29/15 1125  WBC 10.9* 9.2  HGB 9.8* 8.4*  HCT 29.7* 25.8*  PLT 249 215   BMET  Recent Labs  08/27/15 0542 08/29/15 1125  NA 139 135  K 4.3 4.4  CL 94* 95*  CO2 31 27  GLUCOSE 159* 144*  BUN 36* 37*  CREATININE 7.07* 7.98*  CALCIUM 8.5* 7.2*   PT/INR  Recent Labs  08/27/15 0542  LABPROT 16.7*  INR 1.34   ABG No results for input(s): PHART, HCO3 in the last 72 hours.  Invalid input(s): PCO2, PO2  Studies/Results: No results found.  Anti-infectives: Anti-infectives    Start     Dose/Rate Route Frequency Ordered Stop   08/27/15 1200  vancomycin (VANCOCIN) IVPB 1000 mg/200 mL premix     1,000 mg 200 mL/hr over 60 Minutes Intravenous Every M-W-F (Hemodialysis) 08/27/15 0058     08/27/15 1000  piperacillin-tazobactam (ZOSYN) IVPB 3.375 g     3.375 g 12.5 mL/hr over 240 Minutes Intravenous Every 12 hours 08/27/15 0058     08/27/15 0100  vancomycin (VANCOCIN) 1,750 mg in sodium chloride 0.9 % 500 mL IVPB     1,750 mg 250 mL/hr over 120 Minutes Intravenous  Once 08/27/15 0058 08/27/15 0350   08/26/15 2100  piperacillin-tazobactam (ZOSYN) IVPB 3.375 g     3.375  g 12.5 mL/hr over 240 Minutes Intravenous  Once 08/26/15 2056 08/26/15 2131      Assessment/Plan: s/p Procedure(s): IRRIGATION AND DEBRIDEMENT LEFT HEEL, IMPLANTATION OF ANTIBIOTIC BEADS (Left) Assessment: Gangrenous ulceration left heel status post debridement  Plan: Dressing left intact. Discussed with the patient that we will reassess the wound tomorrow.  LOS: 3 days    Dijuan Sleeth W. 08/29/2015

## 2015-08-29 NOTE — Care Management (Signed)
Spoke with patient and family (wife and son) at bedside. [facility]. [facility]. CSW following

## 2015-08-30 ENCOUNTER — Inpatient Hospital Stay: Payer: Medicare Other

## 2015-08-30 LAB — CBC
HEMATOCRIT: 26.9 % — AB (ref 40.0–52.0)
HEMOGLOBIN: 8.8 g/dL — AB (ref 13.0–18.0)
MCH: 29 pg (ref 26.0–34.0)
MCHC: 32.9 g/dL (ref 32.0–36.0)
MCV: 88.4 fL (ref 80.0–100.0)
Platelets: 221 10*3/uL (ref 150–440)
RBC: 3.04 MIL/uL — ABNORMAL LOW (ref 4.40–5.90)
RDW: 15.5 % — AB (ref 11.5–14.5)
WBC: 7.8 10*3/uL (ref 3.8–10.6)

## 2015-08-30 LAB — GLUCOSE, CAPILLARY
GLUCOSE-CAPILLARY: 156 mg/dL — AB (ref 65–99)
Glucose-Capillary: 160 mg/dL — ABNORMAL HIGH (ref 65–99)
Glucose-Capillary: 174 mg/dL — ABNORMAL HIGH (ref 65–99)
Glucose-Capillary: 176 mg/dL — ABNORMAL HIGH (ref 65–99)

## 2015-08-30 LAB — BASIC METABOLIC PANEL
Anion gap: 11 (ref 5–15)
BUN: 22 mg/dL — AB (ref 6–20)
CALCIUM: 7.7 mg/dL — AB (ref 8.9–10.3)
CHLORIDE: 96 mmol/L — AB (ref 101–111)
CO2: 31 mmol/L (ref 22–32)
CREATININE: 5.31 mg/dL — AB (ref 0.61–1.24)
GFR calc Af Amer: 11 mL/min — ABNORMAL LOW (ref >60)
GFR calc non Af Amer: 9 mL/min — ABNORMAL LOW (ref >60)
GLUCOSE: 176 mg/dL — AB (ref 65–99)
Potassium: 4.1 mmol/L (ref 3.5–5.1)
Sodium: 138 mmol/L (ref 135–145)

## 2015-08-30 LAB — HEMOGLOBIN: Hemoglobin: 8.6 g/dL — ABNORMAL LOW (ref 13.0–18.0)

## 2015-08-30 MED ORDER — PANTOPRAZOLE SODIUM 40 MG IV SOLR: 40.0000 mg | Freq: Two times a day (BID) | INTRAVENOUS | Status: DC

## 2015-08-30 MED ORDER — PANTOPRAZOLE SODIUM 40 MG PO TBEC
40.0000 mg | DELAYED_RELEASE_TABLET | Freq: Two times a day (BID) | ORAL | Status: DC
  Administered 2015-08-30 – 2015-09-01 (×5): 40 mg via ORAL
  Filled 2015-08-30 (×5): qty 1

## 2015-08-30 NOTE — Progress Notes (Signed)
Dr Elpidio AnisSudini notified of inability to get IV access, stated would discuss with rounding physician.

## 2015-08-30 NOTE — Progress Notes (Signed)
Primary nurse to to vascular and nephrology. Dr. Wynelle LinkKolluru gave orders to primary nurse to central line. Primary nurse to contact [facility]. Primary nurse to continue to monitor.

## 2015-08-30 NOTE — Progress Notes (Signed)
[name] notified of PICC line order and requested placement. [name] requested that nephrology be notified and consent given per nephrology. Dr. Wynelle LinkKolluru paged and spoken to on phone. Order given from Dr. Wynelle LinkKolluru for primary RN to place an order to consult vascular. [name] contacted and canceled request order for PICC line. Primary RN to continue to monitor.

## 2015-08-30 NOTE — Progress Notes (Signed)
GI Note:  Full note to follow.   Only one nickel sized amt of blood x 1 in vomitus. Only vomited x 1.  Will not plan for EGD based on this one time nickel sized amt of blood.

## 2015-08-30 NOTE — Progress Notes (Signed)
ST Note: Chart reviewed. Spoke to Nsg. No reported dysphagia with current diet. Taking meds well. ST follow up 2-4 days

## 2015-08-30 NOTE — Consult Note (Cosign Needed)
GI Inpatient Consult Note  Reason for Consult: hematemesis   Attending Requesting Consult: Vaickute  History of Present Illness: [name] is a 80 y.o. male with PMHx ESRD on HD, CAD, PVD who was admitted for heel ulcer and underwent debridement on 12/29, GI now consulted for hematemesis.  Per the nursing staff, this morning had one episode of clear emesis but there was one nickel size area of red blood appearing material.  DId not have any emesis prior to this and no emesis throughout the day.   No prior hematemesis.  No melena, has not moved bowels today.   Denies abd pain, diarrhea, recal bleeding, melena, dysphagia, f/c.   No nsaids, no reflux like symptoms.  Has remained hemodynamically stable, no change in Hgb with this episode.   He does report EGD well over 10 years ago, unsure of the reason of the findings.   No fam hx esophageal, gastric ca   Past Medical History:  Past Medical History  Diagnosis Date  . Acute MI, lateral wall, initial episode of care (HCC)   . ESRD on dialysis [facility](HCC)     on HD M/W/F  . Diabetes (HCC)   . CAD (coronary artery disease) 06/25/2014    Mild LAD disease, circumflex 90%-->0% with  2.75 x 16 Promus DES, RCA subtotally occluded with left-right collaterals, EF 45-50% by echo    . HTN (hypertension)   . PVD (peripheral vascular disease) (HCC)   . GERD (gastroesophageal reflux disease)     Problem List: Patient Active Problem List   Diagnosis Date Noted  . Sepsis (HCC) 08/26/2015  . Gangrene of foot (HCC) 08/26/2015  . Type 2 diabetes mellitus (HCC) 08/26/2015  . GERD (gastroesophageal reflux disease) 08/26/2015  . CAD (coronary artery disease) 06/25/2014  . Acute MI, lateral wall, initial episode of care (HCC)   . ESRD (end stage renal disease) on dialysis [facility](HCC)     Past Surgical History: Past Surgical History  Procedure Laterality Date  . R leg surgery    . Cardiac catheterization  06/25/2014    NSTEMI s/p DES to LCx, subtotally  occluded prox RCA  . Left heart catheterization with coronary angiogram N/A 06/25/2014    Procedure: LEFT HEART CATHETERIZATION WITH CORONARY ANGIOGRAM;  Surgeon: Corky CraftsJayadeep S Varanasi, MD;  [facility];  Service: Cardiovascular;  Laterality: N/A;  . Percutaneous coronary stent intervention (pci-s)  06/25/2014    Procedure: PERCUTANEOUS CORONARY STENT INTERVENTION (PCI-S);  Surgeon: Corky CraftsJayadeep S Varanasi, MD;  [facility];  Service: Cardiovascular;;  . Peripheral vascular catheterization N/A 06/03/2015    Procedure: A/V Shunt Intervention;  Surgeon: Renford DillsGregory G Schnier, MD;  Location: [facility] INVASIVE CV LAB;  Service: Cardiovascular;  Laterality: N/A;  . Peripheral vascular catheterization Left 06/03/2015    Procedure: A/V Shuntogram/Fistulagram;  Surgeon: Renford DillsGregory G Schnier, MD;  Location: [facility] INVASIVE CV LAB;  Service: Cardiovascular;  Laterality: Left;  . Peripheral vascular catheterization N/A 08/07/2015    Procedure: Abdominal Aortogram w/Lower Extremity;  Surgeon: Annice NeedyJason S Dew, MD;  Location: [facility] INVASIVE CV LAB;  Service: Cardiovascular;  Laterality: N/A;  . Peripheral vascular catheterization  08/07/2015    Procedure: Lower Extremity Intervention;  Surgeon: Annice NeedyJason S Dew, MD;  Location: [facility] INVASIVE CV LAB;  Service: Cardiovascular;;  . Peripheral vascular catheterization N/A 08/14/2015    Procedure: A/V Shuntogram/Fistulagram;  Surgeon: Annice NeedyJason S Dew, MD;  Location: [facility] INVASIVE CV LAB;  Service: Cardiovascular;  Laterality: N/A;  . Irrigation and debridement foot Left 08/28/2015  Procedure: IRRIGATION AND DEBRIDEMENT LEFT HEEL, IMPLANTATION OF ANTIBIOTIC BEADS;  Surgeon: Linus Galas, MD;  Location: [facility] ORS;  Service: Podiatry;  Laterality: Left;    Allergies: Allergies  Allergen Reactions  . Contrast Media [Iodinated Diagnostic Agents] Hives  . Fish Allergy Hives    Shell fish    Home Medications: Prescriptions prior to admission  Medication Sig Dispense Refill Last Dose   . acetaminophen (TYLENOL) 325 MG tablet Take 650 mg by mouth 3 (three) times daily.   PRN at PRN  . albuterol (PROVENTIL HFA;VENTOLIN HFA) 108 (90 BASE) MCG/ACT inhaler Inhale 2 puffs into the lungs every 6 (six) hours as needed for wheezing or shortness of breath.   unknown at unknown  . aspirin EC 81 MG tablet Take 81 mg by mouth daily.   unknown at unknown  . b complex-vitamin c-folic acid (NEPHRO-VITE) 0.8 MG TABS tablet Take 1 tablet by mouth daily.    unknown at unknown  . calcium elemental as carbonate (TUMS ULTRA 1000) 400 MG chewable tablet Chew 3,000 mg by mouth 3 (three) times daily with meals.   unknown at unknown  . cinacalcet (SENSIPAR) 90 MG tablet Take 90 mg by mouth daily.   unknown at unknown  . clopidogrel (PLAVIX) 75 MG tablet Take 75 mg by mouth daily.   unknown at unknown  . ferrous sulfate 325 (65 FE) MG tablet Take 325 mg by mouth 2 (two) times daily with a meal.   unknown at unknown  . furosemide (LASIX) 20 MG tablet Take 20 mg by mouth daily.   unknown at unknown  . gabapentin (NEURONTIN) 100 MG capsule Take 100 mg by mouth 2 (two) times daily.   unknown at unknown  . insulin glargine (LANTUS) 100 UNIT/ML injection Inject 8 Units into the skin every morning.    unknown at unknown  . Ipratropium-Albuterol (COMBIVENT RESPIMAT) 20-100 MCG/ACT AERS respimat Inhale 2 puffs into the lungs every 6 (six) hours as needed for wheezing or shortness of breath.   unknown at unknown  . isosorbide mononitrate (IMDUR) 30 MG 24 hr tablet Take 30 mg by mouth daily.   unknown at unknown  . lanthanum (FOSRENOL) 1000 MG chewable tablet Chew 500 mg by mouth 3 (three) times daily with meals.   unknown at unknown  . losartan (COZAAR) 25 MG tablet Take 1 tablet (25 mg total) by mouth daily. (Patient taking differently: Take 12.5 mg by mouth daily. ) 30 tablet 11 unknown at unknown  . metoprolol tartrate (LOPRESSOR) 25 MG tablet Take 12.5 mg by mouth 2 (two) times daily.   unknown at unknown  .  nitroGLYCERIN (NITROSTAT) 0.6 MG SL tablet Place 0.6 mg under the tongue every 5 (five) minutes as needed for chest pain.   PRN at PRN  . Nutritional Supplements (NEPRO) LIQD Take 8 oz by mouth 2 (two) times daily.   unkown at unknown  . omeprazole (PRILOSEC OTC) 20 MG tablet Take 20 mg by mouth daily.   unknown at unknown  . oxyCODONE (OXY IR/ROXICODONE) 5 MG immediate release tablet Take 5 mg by mouth every 6 (six) hours as needed (pain).   unknown at unknown  . sevelamer carbonate (RENVELA) 2.4 G PACK Take 2.4 g by mouth 3 (three) times daily with meals.   unknown at unknown  . sitaGLIPtin (JANUVIA) 25 MG tablet Take 25 mg by mouth daily.   unknown at unknown   Home medication reconciliation was not completed with the patient.   Scheduled Inpatient Medications:   .  antiseptic oral rinse  7 mL Mouth Rinse q12n4p  . chlorhexidine  15 mL Mouth Rinse BID  . cinacalcet  90 mg Oral Daily  . epoetin (EPOGEN/PROCRIT) injection  10,000 Units Intravenous Q M,W,F-HD  . feeding supplement (NEPRO CARB STEADY)  237 mL Oral BID BM  . insulin aspart  0-9 Units Subcutaneous TID AC & HS  . lanthanum  500 mg Oral TID WC  . losartan  12.5 mg Oral Daily  . metoprolol tartrate  12.5 mg Oral BID  . pantoprazole  40 mg Oral BID  . piperacillin-tazobactam (ZOSYN)  IV  3.375 g Intravenous Q12H  . sevelamer carbonate  2.4 g Oral TID WC  . sodium chloride  3 mL Intravenous Q12H  . sodium chloride  3 mL Intravenous Q12H    Continuous Inpatient Infusions:     PRN Inpatient Medications:  sodium chloride, sodium chloride, sodium chloride, acetaminophen **OR** acetaminophen, albuterol, alteplase, heparin, ipratropium-albuterol, lidocaine (PF), lidocaine-prilocaine, morphine injection, morphine injection, ondansetron **OR** ondansetron (ZOFRAN) IV, oxyCODONE, oxyCODONE-acetaminophen, pentafluoroprop-tetrafluoroeth  Family History: family history includes Blindness in his mother; Bone cancer in his father;  Diabetes in his mother; Hypertension in his father.  The patient's family history is negative for inflammatory bowel disorders, GI malignancy, or solid organ transplantation.  Social History:   reports that he quit smoking about 59 years ago. He does not have any smokeless tobacco history on file. He reports that he does not drink alcohol or use illicit drugs.   Review of Systems: Constitutional: Weight is stable.  Eyes: No changes in vision. + blind ENT: No oral lesions, sore throat.  GI: see HPI.  Heme/Lymph: No easy bruising.  CV: No chest pain.  GU: No hematuria.  Integumentary: No rashes.  Neuro: No headaches.  Psych: No depression/anxiety.  Endocrine: No heat/cold intolerance.  Allergic/Immunologic: No urticaria.  Resp: No cough, SOB.  Musculoskeletal: No joint swelling.    Physical Examination: BP 153/54 mmHg  Pulse 67  Temp(Src) 99.3 F (37.4 C) (Oral)  Resp 16  Ht  (1.753 m)  Wt 101.4 kg (223 lb 8.7 oz)  BMI 33.00 kg/m2  SpO2 99% Gen: NAD, alert and oriented x 4 Neck: supple, no JVD or thyromegaly Chest: CTA bilaterally, no wheezes, crackles, or other adventitious sounds CV: RRR, no m/g/c/r Abd: soft, NT, ND, +BS in all four quadrants; no HSM, guarding, ridigity, or rebound tenderness Ext: no edema, trace swelling bilat.  Skin: no rash or lesions noted Lymph: no LAD  Data: Lab Results  Component Value Date   WBC 7.8 08/30/2015   HGB 8.6* 08/30/2015   HCT 26.9* 08/30/2015   MCV 88.4 08/30/2015   PLT 221 08/30/2015    Recent Labs Lab 08/29/15 1125 08/30/15 0712 08/30/15 1738  HGB 8.4* 8.8* 8.6*   Lab Results  Component Value Date   NA 138 08/30/2015   K 4.1 08/30/2015   CL 96* 08/30/2015   CO2 31 08/30/2015   BUN 22* 08/30/2015   CREATININE 5.31* 08/30/2015   Lab Results  Component Value Date   ALT 14* 08/26/2015   AST 19 08/26/2015   ALKPHOS 151* 08/26/2015   BILITOT 0.8 08/26/2015    Recent Labs Lab 08/27/15 0542  INR 1.34    Assessment/Plan: Mr. Osorto is a 80 y.o. male admitted for heel ulcer with emesis x 1 one this am with a nickel size area of blood.  No further emesis, no stool, no hemodynamic changes.  Would not perofrm EGD based on this  tiny amount of blood x 1.   Recommendations: - cont protonix 40 mg BID for now, can decrease to 40 daily in few days - monitor Hgb until stable.  - avoid nsaids - would only perform EGD if further significant hematemesis and drop in Hgb.   Thank you for the consult. Please call with questions or concerns.  Amador Braddy, Addison Naegeli, MD

## 2015-08-30 NOTE — Progress Notes (Signed)
Key Points: Use following P&T approved IV to PO antibiotic change policy.  Description contains the criteria that are approved Note: Policy Excludes:  Esophagectomy patientsPHARMACIST - PHYSICIAN COMMUNICATION DR:   Winona LegatoVaickute CONCERNING: IV to Oral Route Change Policy  RECOMMENDATION: This patient is receiving pantoprazole by the intravenous route.  Based on criteria approved by the Pharmacy and Therapeutics Committee, the intravenous medication(s) is/are being converted to the equivalent oral dose form(s).   DESCRIPTION: These criteria include:  The patient is eating (either orally or via tube) and/or has been taking other orally administered medications for a least 24 hours  The patient has no evidence of active gastrointestinal bleeding or impaired GI absorption (gastrectomy, short bowel, patient on TNA or NPO).  If you have questions about this conversion, please contact the Pharmacy Department  []   [phone]( [phone] )  [facility] [x]   [phone]( [phone] )  [facility] []   [phone]( [phone]  [facility] []   [phone]( [phone]  [facility] []   [phone]( [phone] )  [facility]   Valentina GuChristy, Hildy Nicholl D, [facility] 08/30/2015 9:25 AM

## 2015-08-30 NOTE — Progress Notes (Signed)
Primary nurse notified MD that pt had no IV access. Multiple attempts made last nights. Orders received for PICC line. Pt also had one episode of emisis this AM. MD notified. Primary nurse to continue to monitor.

## 2015-08-30 NOTE — Progress Notes (Signed)
[facility]  ROUNDING NOTE   Subjective:   Hemodialysis yesterday. Tolerated treatment well. UF goal of 1 litre  No IV access. [name] called to place central line  Objective:  Vital signs in last 24 hours:  Temp:  [98.2 F (36.8 C)-98.6 F (37 C)] 98.2 F (36.8 C) (12/31 0511) Pulse Rate:  [70-83] 70 (12/31 0511) Resp:  [11-18] 18 (12/31 0511) BP: (83-137)/(43-89) 112/61 mmHg (12/31 0511) SpO2:  [95 %-96 %] 96 % (12/31 0511) Weight:  [100.7 kg (222 lb 0.1 oz)-101.4 kg (223 lb 8.7 oz)] 101.4 kg (223 lb 8.7 oz) (12/31 0650)  Weight change: 0.149 kg (5.3 oz) Filed Weights   08/29/15 0940 08/29/15 1300 08/30/15 0650  Weight: 101.8 kg (224 lb 6.9 oz) 100.7 kg (222 lb 0.1 oz) 101.4 kg (223 lb 8.7 oz)    Intake/Output: I/O last 3 completed shifts: In: 120 [P.O.:120] Out: 1000 [Other:1000]   Intake/Output this shift:     Physical Exam: General: NAD, resting in bed  Head: Normocephalic, atraumatic. Moist oral mucosal membranes  Eyes: Anicteric  Neck: Supple, trachea midline  Lungs:  Clear to auscultation normal effort  Heart: Regular rate and rhythm  Abdomen:  Soft, nontender, BS present  Extremities:  no peripheral edema, bilateral ankles in clean and dry dressings  Neurologic: Nonfocal, moving all four extremities  Skin: Left heel in dressings  Access: LUE AVF    Basic Metabolic Panel:  Recent Labs Lab 08/26/15 1915 08/27/15 0542 08/27/15 1902 08/29/15 1125 08/30/15 0712  NA 136 139  --  135 138  K 4.5 4.3  --  4.4 4.1  CL 92* 94*  --  95* 96*  CO2 31 31  --  27 31  GLUCOSE 229* 159*  --  144* 176*  BUN 31* 36*  --  37* 22*  CREATININE 6.21* 7.07*  --  7.98* 5.31*  CALCIUM 8.3* 8.5*  --  7.2* 7.7*  PHOS  --   --  4.2 4.2  --     Liver Function Tests:  Recent Labs Lab 08/26/15 1915 08/29/15 1125  AST 19  --   ALT 14*  --   ALKPHOS 151*  --   BILITOT 0.8  --   PROT 8.2*  --   ALBUMIN 2.6* 2.0*   No results for input(s):  LIPASE, AMYLASE in the last 168 hours. No results for input(s): AMMONIA in the last 168 hours.  CBC:  Recent Labs Lab 08/26/15 1915 08/27/15 0542 08/29/15 1125 08/30/15 0712  WBC 14.2* 10.9* 9.2 7.8  NEUTROABS 11.7*  --   --   --   HGB 10.3* 9.8* 8.4* 8.8*  HCT 32.3* 29.7* 25.8* 26.9*  MCV 88.6 86.5 87.1 88.4  PLT 268 249 215 221    Cardiac Enzymes: No results for input(s): CKTOTAL, CKMB, CKMBINDEX, TROPONINI in the last 168 hours.  BNP: Invalid input(s): POCBNP  CBG:  Recent Labs Lab 08/29/15 1328 08/29/15 1645 08/29/15 2133 08/29/15 2244 08/30/15 0730  GLUCAP 115* 204* 192* 187* 160*    Microbiology: Results for orders placed or performed during the hospital encounter of 08/26/15  Culture, blood (Routine X 2) w Reflex to ID Panel     Status: None   Collection Time: 08/26/15  8:58 PM  Result Value Ref Range Status   Specimen Description BLOOD RIGHT ANTECUBITAL  Final   Special Requests BOTTLES DRAWN AEROBIC AND ANAEROBIC 5ML  Final   Culture  Setup Time   Final  GRAM NEGATIVE RODS IN BOTH AEROBIC AND ANAEROBIC BOTTLES CRITICAL RESULT CALLED TO, READ BACK BY AND VERIFIED WITH: HENRY ZOMPA 08/27/15 1350 MLM    Culture PROTEUS MIRABILIS  Final   Report Status 08/29/2015 FINAL  Final   Organism ID, Bacteria PROTEUS MIRABILIS  Final      Susceptibility   Proteus mirabilis - MIC*    AMPICILLIN <=2 SENSITIVE Sensitive     CEFTAZIDIME <=1 SENSITIVE Sensitive     CEFAZOLIN <=4 SENSITIVE Sensitive     CEFTRIAXONE <=1 SENSITIVE Sensitive     CIPROFLOXACIN <=0.25 SENSITIVE Sensitive     GENTAMICIN <=1 SENSITIVE Sensitive     IMIPENEM 2 SENSITIVE Sensitive     TRIMETH/SULFA <=20 SENSITIVE Sensitive     PIP/TAZO Value in next row Sensitive      SENSITIVE<=4    * PROTEUS MIRABILIS  Blood Culture ID Panel (Reflexed)     Status: Abnormal   Collection Time: 08/26/15  8:58 PM  Result Value Ref Range Status   Enterococcus species NOT DETECTED NOT DETECTED Final    Listeria monocytogenes NOT DETECTED NOT DETECTED Final   Staphylococcus species NOT DETECTED NOT DETECTED Final   Staphylococcus aureus NOT DETECTED NOT DETECTED Final   Streptococcus species NOT DETECTED NOT DETECTED Final   Streptococcus agalactiae NOT DETECTED NOT DETECTED Final   Streptococcus pneumoniae NOT DETECTED NOT DETECTED Final   Streptococcus pyogenes NOT DETECTED NOT DETECTED Final   Acinetobacter baumannii NOT DETECTED NOT DETECTED Final   Enterobacteriaceae species DETECTED (A) NOT DETECTED Final    Comment: CRITICAL RESULT CALLED TO, READ BACK BY AND VERIFIED WITH: HENRY ZOMPA 08/27/15 1350 MLM    Enterobacter cloacae complex NOT DETECTED NOT DETECTED Final   Escherichia coli NOT DETECTED NOT DETECTED Final   Klebsiella oxytoca NOT DETECTED NOT DETECTED Final   Klebsiella pneumoniae NOT DETECTED NOT DETECTED Final   Proteus species DETECTED (A) NOT DETECTED Final    Comment: CRITICAL RESULT CALLED TO, READ BACK BY AND VERIFIED WITH: HENRY ZOMPA 08/27/15 1350 MLM    Serratia marcescens NOT DETECTED NOT DETECTED Final   Haemophilus influenzae NOT DETECTED NOT DETECTED Final   Neisseria meningitidis NOT DETECTED NOT DETECTED Final   Pseudomonas aeruginosa NOT DETECTED NOT DETECTED Final   Candida albicans NOT DETECTED NOT DETECTED Final   Candida glabrata NOT DETECTED NOT DETECTED Final   Candida krusei NOT DETECTED NOT DETECTED Final   Candida parapsilosis NOT DETECTED NOT DETECTED Final   Candida tropicalis NOT DETECTED NOT DETECTED Final   Carbapenem resistance NOT DETECTED NOT DETECTED Final   Methicillin resistance NOT DETECTED NOT DETECTED Final   Vancomycin resistance NOT DETECTED NOT DETECTED Final  MRSA PCR Screening     Status: None   Collection Time: 08/26/15 11:43 PM  Result Value Ref Range Status   MRSA by PCR NEGATIVE NEGATIVE Final    Comment:        The GeneXpert MRSA Assay (FDA approved for NASAL specimens only), is one component of  a comprehensive MRSA colonization surveillance program. It is not intended to diagnose MRSA infection nor to guide or monitor treatment for MRSA infections.   Culture, blood (Routine X 2) w Reflex to ID Panel     Status: None (Preliminary result)   Collection Time: 08/27/15 12:30 AM  Result Value Ref Range Status   Specimen Description BLOOD LEFT HAND  Final   Special Requests BOTTLES DRAWN AEROBIC AND ANAEROBIC  Final   Culture NO GROWTH 1 DAY  Final  Report Status PENDING  Incomplete  Wound culture     Status: None (Preliminary result)   Collection Time: 08/28/15  5:43 PM  Result Value Ref Range Status   Specimen Description HEEL  Final   Special Requests NONE  Final   Gram Stain NO WBC SEEN NO ORGANISMS SEEN   Final   Culture HOLDING FOR POSSIBLE PATHOGEN  Final   Report Status PENDING  Incomplete    Coagulation Studies: No results for input(s): LABPROT, INR in the last 72 hours.  Urinalysis: No results for input(s): COLORURINE, LABSPEC, PHURINE, GLUCOSEU, HGBUR, BILIRUBINUR, KETONESUR, PROTEINUR, UROBILINOGEN, NITRITE, LEUKOCYTESUR in the last 72 hours.  Invalid input(s): APPERANCEUR    Imaging: No results found.   Medications:     . antiseptic oral rinse  7 mL Mouth Rinse q12n4p  . chlorhexidine  15 mL Mouth Rinse BID  . cinacalcet  90 mg Oral Daily  . epoetin (EPOGEN/PROCRIT) injection  10,000 Units Intravenous Q M,W,F-HD  . feeding supplement (NEPRO CARB STEADY)  237 mL Oral BID BM  . insulin aspart  0-9 Units Subcutaneous TID AC & HS  . lanthanum  500 mg Oral TID WC  . losartan  12.5 mg Oral Daily  . metoprolol tartrate  12.5 mg Oral BID  . pantoprazole  40 mg Oral BID  . piperacillin-tazobactam (ZOSYN)  IV  3.375 g Intravenous Q12H  . sevelamer carbonate  2.4 g Oral TID WC  . sodium chloride  3 mL Intravenous Q12H  . sodium chloride  3 mL Intravenous Q12H  . vancomycin  1,000 mg Intravenous Q M,W,F-HD   sodium chloride, sodium chloride, sodium  chloride, acetaminophen **OR** acetaminophen, albuterol, alteplase, heparin, ipratropium-albuterol, lidocaine (PF), lidocaine-prilocaine, morphine injection, morphine injection, ondansetron **OR** ondansetron (ZOFRAN) IV, oxyCODONE, oxyCODONE-acetaminophen, pentafluoroprop-tetrafluoroeth  Assessment/ Plan:  80 y.o. black male past medical history of end-stage renal disease on hematemesis Monday, Wednesday, Friday, diabetes mellitus, coronary artery disease, hypertension, peripheral vascular disease, GERD, anemia chronic kidney disease, history of myocardial infarction, secondary hyperparathyroidism presented with fever and eschar on left heel.  [facility] MWF [facility] [place]  1. End-stage renal disease on hemodialysis Monday, Wednesday, Friday.  Hemodialysis yesterday. Tolerated treatment well. UF goal of 1 litre Continue MWF schedule.   2. Anemia of chronic kidney disease. Hemoglobin currently 8.8 - epo with treatment  3. Secondary hyperparathyroidism. Phos 4.2, PTH 244 - continue sensipar, renvela, and fosrenol.  4. Hypertension. Blood pressure at goal -  continue losartan and metoprolol.   5. Bacteremia and chronic foot ulcer: blood culture growing proteus mirabilis.  - needs IV access: have discussed with vascular surgery about placing central line. Do not want a PICC line due to ESRD and AVF. [facility] consulted.  - pip/tazo and vanco   LOS: 4 Xitlalic Maslin 12/31/201610:14 AM

## 2015-08-30 NOTE — Progress Notes (Signed)
2 Days Post-Op  Subjective: Patient seen. Denies any significant pain with his heel. States there is a sore on his right ankle area.  Objective: Vital signs in last 24 hours: Temp:  [98.2 F (36.8 C)-98.6 F (37 C)] 98.2 F (36.8 C) (12/31 0511) Pulse Rate:  [70-83] 70 (12/31 0511) Resp:  [11-18] 18 (12/31 0511) BP: (83-137)/(43-89) 112/61 mmHg (12/31 0511) SpO2:  [95 %-96 %] 96 % (12/31 0511) Weight:  [100.7 kg (222 lb 0.1 oz)-101.4 kg (223 lb 8.7 oz)] 101.4 kg (223 lb 8.7 oz) (12/31 0650) Last BM Date: 08/29/15  Intake/Output from previous day: 12/30 0701 - 12/31 0700 In: 120 [P.O.:120] Out: 1000  Intake/Output this shift:    The bandage on left foot is dry and intact. Upon removal there is only mild to moderate bleeding from the heel wound. Significant reduction in the erythema and edema around the left heel and ankle area. No significant purulence or malodor to the wound upon removal of the bandage. There is noted to be a small ulceration still present over the lateral malleolus ankle area on the right  Lab Results:   Recent Labs  08/29/15 1125 08/30/15 0712  WBC 9.2 7.8  HGB 8.4* 8.8*  HCT 25.8* 26.9*  PLT 215 221   BMET  Recent Labs  08/29/15 1125 08/30/15 0712  NA 135 138  K 4.4 4.1  CL 95* 96*  CO2 27 31  GLUCOSE 144* 176*  BUN 37* 22*  CREATININE 7.98* 5.31*  CALCIUM 7.2* 7.7*   PT/INR No results for input(s): LABPROT, INR in the last 72 hours. ABG No results for input(s): PHART, HCO3 in the last 72 hours.  Invalid input(s): PCO2, PO2  Studies/Results: No results found.  Anti-infectives: Anti-infectives    Start     Dose/Rate Route Frequency Ordered Stop   08/27/15 1200  vancomycin (VANCOCIN) IVPB 1000 mg/200 mL premix     1,000 mg 200 mL/hr over 60 Minutes Intravenous Every M-W-F (Hemodialysis) 08/27/15 0058     08/27/15 1000  piperacillin-tazobactam (ZOSYN) IVPB 3.375 g     3.375 g 12.5 mL/hr over 240 Minutes Intravenous Every 12 hours  08/27/15 0058     08/27/15 0100  vancomycin (VANCOCIN) 1,750 mg in sodium chloride 0.9 % 500 mL IVPB     1,750 mg 250 mL/hr over 120 Minutes Intravenous  Once 08/27/15 0058 08/27/15 0350   08/26/15 2100  piperacillin-tazobactam (ZOSYN) IVPB 3.375 g     3.375 g 12.5 mL/hr over 240 Minutes Intravenous  Once 08/26/15 2056 08/26/15 2131      Assessment/Plan: s/p Procedure(s): IRRIGATION AND DEBRIDEMENT LEFT HEEL, IMPLANTATION OF ANTIBIOTIC BEADS (Left) Assessment: Gangrenous ulceration status post debridement left heel, stable   Plan: Dry gauze dressing reapplied to the wound on the left heel. Discussed with the nurse that at this point I would recommend a wet-to-dry dressing to the ulceration on the right ankle. Also will consider getting some pressure relief boots to help keep pressure off that area. At this point we will plan on changing the bandage in 2-3 days for reevaluation  LOS: 4 days    Kaylynn Chamblin W. 08/30/2015

## 2015-08-30 NOTE — Progress Notes (Signed)
[facility] [place] at [facility]   PATIENT NAME: [name] HampshireBurnice [name]    MR#:  [id number]  DATE OF BIRTH:  1935-05-13  SUBJECTIVE:  CHIEF COMPLAINT:   Chief Complaint  Patient presents with  . Fever    - Patient complaining of right lateral malleolus, less so left heel pain. End-stage renal patient admitted for PAD left heel ulceration for which he had debridement done in the OR on 08/28/2015 -Blood cultures on admission growing Proteus and Enterobacter, now on Zosyn and vancomycin. Poor IV access, for which central line placement was recommended by nephrologist. Vascular consultation is obtained. -Wound cultures from the OR yesterday are still pending. -Patient is afebrile. WBC is improved  REVIEW OF SYSTEMS:  Review of Systems  Constitutional: Positive for chills. Negative for fever and weight loss.  HENT: Negative for congestion.   Eyes: Negative for blurred vision and double vision.  Respiratory: Positive for cough and sputum production. Negative for shortness of breath and wheezing.   Cardiovascular: Negative for chest pain, palpitations, orthopnea, leg swelling and PND.  Gastrointestinal: Negative for nausea, vomiting, abdominal pain, diarrhea, constipation and blood in stool.  Genitourinary: Negative for dysuria, urgency, frequency and hematuria.  Musculoskeletal: Negative for falls.  Neurological: Negative for dizziness, tremors, focal weakness and headaches.  Endo/Heme/Allergies: Does not bruise/bleed easily.  Psychiatric/Behavioral: Negative for depression. The patient does not have insomnia.    complains of pain in left heel   VITAL SIGNS:  VITAL SIGNS: Blood pressure 112/61, pulse 70, temperature 98.2 F (36.8 C), temperature source Oral, resp. rate 18, height 5\' 9"  (1.753 m), weight 101.4 kg (223 lb 8.7 oz), SpO2 96 %.  PHYSICAL EXAMINATION:   GENERAL:  80 y.o.-year-old patient lying in the bed with no acute distress. More alert and  communicating today. EYES: Pupils equal, round, reactive to light and accommodation. No scleral icterus. Extraocular muscles intact.  HEENT: Head atraumatic, normocephalic. Oropharynx and nasopharynx clear.  NECK:  Supple, no jugular venous distention. No thyroid enlargement, no tenderness.  LUNGS: Normal breath sounds bilaterally, no wheezing, rales,rhonchi or crepitation. No use of accessory muscles of respiration. Decreased bibasilar breath sounds noted CARDIOVASCULAR: S1, S2 normal. No murmurs, rubs, or gallops.  ABDOMEN: Soft, nontender, nondistended. Bowel sounds present. No organomegaly or mass.  EXTREMITIES: No pedal edema, cyanosis, or clubbing. Right heel in dressing. Left heel with dressing from surgery with Ace wrap around it. Right foot lateral malleolus has a small ulceration with some purulent discharge. Dressing was applied Unable to palpate dorsalis pedis pulses in both feet NEUROLOGIC: Cranial nerves II through XII are intact. Muscle strength 5/5 in all extremities. Sensation intact. Gait not checked.  PSYCHIATRIC: The patient is alert and oriented x 3.  SKIN: No obvious rash, lesion, or ulcer.   ORDERS/RESULTS REVIEWED:   CBC  Recent Labs Lab 08/26/15 1915 08/27/15 0542 08/29/15 1125 08/30/15 0712  WBC 14.2* 10.9* 9.2 7.8  HGB 10.3* 9.8* 8.4* 8.8*  HCT 32.3* 29.7* 25.8* 26.9*  PLT 268 249 215 221  MCV 88.6 86.5 87.1 88.4  MCH 28.4 28.6 28.5 29.0  MCHC 32.1 33.1 32.7 32.9  RDW 15.6* 15.1* 15.0* 15.5*  LYMPHSABS 1.3  --   --   --   MONOABS 1.2*  --   --   --   EOSABS 0.0  --   --   --   BASOSABS 0.1  --   --   --    ------------------------------------------------------------------------------------------------------------------  Chemistries   Recent Labs Lab  08/26/15 1915 08/27/15 0542 08/29/15 1125 08/30/15 0712  NA 136 139 135 138  K 4.5 4.3 4.4 4.1  CL 92* 94* 95* 96*  CO2 GLUCOSE 229* 159* 144* 176*  BUN 31* 36* 37* 22*   CREATININE 6.21* 7.07* 7.98* 5.31*  CALCIUM 8.3* 8.5* 7.2* 7.7*  AST 19  --   --   --   ALT 14*  --   --   --   ALKPHOS 151*  --   --   --   BILITOT 0.8  --   --   --    ------------------------------------------------------------------------------------------------------------------ estimated creatinine clearance is 13.5 mL/min (by C-G formula based on Cr of 5.31). ------------------------------------------------------------------------------------------------------------------ No results for input(s): TSH, T4TOTAL, T3FREE, THYROIDAB in the last 72 hours.  Invalid input(s): FREET3  Cardiac Enzymes No results for input(s): CKMB, TROPONINI, MYOGLOBIN in the last 168 hours.  Invalid input(s): CK ------------------------------------------------------------------------------------------------------------------ Invalid input(s): POCBNP ---------------------------------------------------------------------------------------------------------------  RADIOLOGY: No results found.  EKG:  Orders placed or performed during the hospital encounter of 08/26/15  . EKG 12-Lead  . EKG 12-Lead    ASSESSMENT AND PLAN:  Principal Problem:   Sepsis (HCC) Active Problems:   ESRD (end stage renal disease) on dialysis (HCC)   CAD (coronary artery disease)   Gangrene of foot (HCC)   Type 2 diabetes mellitus (HCC)   GERD (gastroesophageal reflux disease)  1. Sepsis due to Proteus bacteremia-. Likely source is the left lower extremity gangrenous ulcer. -Appreciate podiatry consult. -Due to left heel gangrene/infected ulcer, continue broad-spectrum antibiotic therapy while waiting for the wound culture results from debridement yesterday - Vancomycin can be discontinued today - Due to deep infection noted- will check with podiatry about need for IV ABX at discharge - Dressing changes and weight bearing per podiatry - patient is usually bed bound at baseline/ wheelchair bound.  2. PAD with Left  heel gangrene/infected ulcer - status post PCI by vascular surgery 2 weeks ago - Plavix was on hold for irrigation and debridement of left foot ulcer - Restarted plavix - appreciate podiatry input, continue broad-spectrum antibiotics for now until wound cultures unknown  3. End-stage renal disease: om M-W-F hemodialysis, nephrology consultation is obtained. Hemodialysis per schedule - had dialysis yesterday and next on Monday  4. Diabetes mellitus type 2, continue outpatient therapy and sliding scale insulin,   5.HTN- losartan and metoprolol- low doses, well controlled  6. Anemia of chronic disease- on epogen with dialysis. Stable  7. DVT Prophylaxis- on heparin SQ  Management plans discussed with the patient, family (wife and son )and they are in agreement. Patient on 2L o2- wean as tolerated as not on home o2. Use incentive spirometry as needed.  Patient is from [facility] long term care.   DRUG ALLERGIES:  Allergies  Allergen Reactions  . Contrast Media [Iodinated Diagnostic Agents] Hives  . Fish Allergy Hives    Shell fish    CODE STATUS:     Code Status Orders        Start     Ordered   08/26/15 2333  Full code   Continuous     08/26/15 2332      TOTAL TIME SPENT IN TAKING CARE OF THIS PATIENT: 35 minutes.    Katharina Caper M.D on 08/30/2015 at 12:29 PM  Between 7am to 6pm - Pager - [phone]  After 6pm go to [email or web address] - password [email or web address] [facility]  [place] [facility]  Office  [phone]  CC: Primary care physician;  No PCP Per Patient

## 2015-08-31 LAB — GLUCOSE, CAPILLARY
GLUCOSE-CAPILLARY: 196 mg/dL — AB (ref 65–99)
GLUCOSE-CAPILLARY: 155 mg/dL — AB (ref 65–99)
Glucose-Capillary: 120 mg/dL — ABNORMAL HIGH (ref 65–99)
Glucose-Capillary: 154 mg/dL — ABNORMAL HIGH (ref 65–99)

## 2015-08-31 LAB — HEMOGLOBIN
HEMOGLOBIN: 8.3 g/dL — AB (ref 13.0–18.0)
Hemoglobin: 8.7 g/dL — ABNORMAL LOW (ref 13.0–18.0)

## 2015-08-31 NOTE — Progress Notes (Signed)
[facility]  ROUNDING NOTE   Subjective:   Central line placed yesterday.   Objective:  Vital signs in last 24 hours:  Temp:  [97.6 F (36.4 C)-99.3 F (37.4 C)] 97.6 F (36.4 C) (01/01 0459) Pulse Rate:  [65-73] 65 (01/01 0459) Resp:  [16] 16 (01/01 0459) BP: (146-153)/(54-61) 150/61 mmHg (01/01 0459) SpO2:  [98 %-100 %] 100 % (01/01 0459) Weight:  [100.744 kg (222 lb 1.6 oz)] 100.744 kg (222 lb 1.6 oz) (01/01 0459)  Weight change: -1.056 kg (-2 lb 5.3 oz) Filed Weights   08/29/15 1300 08/30/15 0650 08/31/15 0459  Weight: 100.7 kg (222 lb 0.1 oz) 101.4 kg (223 lb 8.7 oz) 100.744 kg (222 lb 1.6 oz)    Intake/Output:     Intake/Output this shift:     Physical Exam: General: NAD, resting in bed  Head: Normocephalic, atraumatic. Moist oral mucosal membranes  Eyes: Anicteric  Neck: Supple, trachea midline, RIJ central line  Lungs:  Clear to auscultation normal effort  Heart: Regular rate and rhythm  Abdomen:  Soft, nontender, BS present  Extremities:  no peripheral edema, bilateral ankles in clean and dry dressings  Neurologic: Nonfocal, moving all four extremities  Skin: Left heel in dressings  Access: LUE AVF    Basic Metabolic Panel:  Recent Labs Lab 08/26/15 1915 08/27/15 0542 08/27/15 1902 08/29/15 1125 08/30/15 0712  NA 136 139  --  135 138  K 4.5 4.3  --  4.4 4.1  CL 92* 94*  --  95* 96*  CO2 31 31  --  27 31  GLUCOSE 229* 159*  --  144* 176*  BUN 31* 36*  --  37* 22*  CREATININE 6.21* 7.07*  --  7.98* 5.31*  CALCIUM 8.3* 8.5*  --  7.2* 7.7*  PHOS  --   --  4.2 4.2  --     Liver Function Tests:  Recent Labs Lab 08/26/15 1915 08/29/15 1125  AST 19  --   ALT 14*  --   ALKPHOS 151*  --   BILITOT 0.8  --   PROT 8.2*  --   ALBUMIN 2.6* 2.0*   No results for input(s): LIPASE, AMYLASE in the last 168 hours. No results for input(s): AMMONIA in the last 168 hours.  CBC:  Recent Labs Lab 08/26/15 1915 08/27/15 0542  08/29/15 1125 08/30/15 0712 08/30/15 1738 08/31/15 0247 08/31/15 0936  WBC 14.2* 10.9* 9.2 7.8  --   --   --   NEUTROABS 11.7*  --   --   --   --   --   --   HGB 10.3* 9.8* 8.4* 8.8* 8.6* 8.3* 8.7*  HCT 32.3* 29.7* 25.8* 26.9*  --   --   --   MCV 88.6 86.5 87.1 88.4  --   --   --   PLT 268 249 215 221  --   --   --     Cardiac Enzymes: No results for input(s): CKTOTAL, CKMB, CKMBINDEX, TROPONINI in the last 168 hours.  BNP: Invalid input(s): POCBNP  CBG:  Recent Labs Lab 08/30/15 0730 08/30/15 1152 08/30/15 1647 08/30/15 2223 08/31/15 0725  GLUCAP 160* 174* 176* 156* 120*    Microbiology: Results for orders placed or performed during the hospital encounter of 08/26/15  Culture, blood (Routine X 2) w Reflex to ID Panel     Status: None   Collection Time: 08/26/15  8:58 PM  Result Value Ref Range Status   Specimen Description  BLOOD RIGHT ANTECUBITAL  Final   Special Requests BOTTLES DRAWN AEROBIC AND ANAEROBIC 5ML  Final   Culture  Setup Time   Final    GRAM NEGATIVE RODS IN BOTH AEROBIC AND ANAEROBIC BOTTLES CRITICAL RESULT CALLED TO, READ BACK BY AND VERIFIED WITH: HENRY ZOMPA 08/27/15 1350 MLM    Culture PROTEUS MIRABILIS  Final   Report Status 08/29/2015 FINAL  Final   Organism ID, Bacteria PROTEUS MIRABILIS  Final      Susceptibility   Proteus mirabilis - MIC*    AMPICILLIN <=2 SENSITIVE Sensitive     CEFTAZIDIME <=1 SENSITIVE Sensitive     CEFAZOLIN <=4 SENSITIVE Sensitive     CEFTRIAXONE <=1 SENSITIVE Sensitive     CIPROFLOXACIN <=0.25 SENSITIVE Sensitive     GENTAMICIN <=1 SENSITIVE Sensitive     IMIPENEM 2 SENSITIVE Sensitive     TRIMETH/SULFA <=20 SENSITIVE Sensitive     PIP/TAZO Value in next row Sensitive      SENSITIVE<=4    * PROTEUS MIRABILIS  Blood Culture ID Panel (Reflexed)     Status: Abnormal   Collection Time: 08/26/15  8:58 PM  Result Value Ref Range Status   Enterococcus species NOT DETECTED NOT DETECTED Final   Listeria  monocytogenes NOT DETECTED NOT DETECTED Final   Staphylococcus species NOT DETECTED NOT DETECTED Final   Staphylococcus aureus NOT DETECTED NOT DETECTED Final   Streptococcus species NOT DETECTED NOT DETECTED Final   Streptococcus agalactiae NOT DETECTED NOT DETECTED Final   Streptococcus pneumoniae NOT DETECTED NOT DETECTED Final   Streptococcus pyogenes NOT DETECTED NOT DETECTED Final   Acinetobacter baumannii NOT DETECTED NOT DETECTED Final   Enterobacteriaceae species DETECTED (A) NOT DETECTED Final    Comment: CRITICAL RESULT CALLED TO, READ BACK BY AND VERIFIED WITH: HENRY ZOMPA 08/27/15 1350 MLM    Enterobacter cloacae complex NOT DETECTED NOT DETECTED Final   Escherichia coli NOT DETECTED NOT DETECTED Final   Klebsiella oxytoca NOT DETECTED NOT DETECTED Final   Klebsiella pneumoniae NOT DETECTED NOT DETECTED Final   Proteus species DETECTED (A) NOT DETECTED Final    Comment: CRITICAL RESULT CALLED TO, READ BACK BY AND VERIFIED WITH: HENRY ZOMPA 08/27/15 1350 MLM    Serratia marcescens NOT DETECTED NOT DETECTED Final   Haemophilus influenzae NOT DETECTED NOT DETECTED Final   Neisseria meningitidis NOT DETECTED NOT DETECTED Final   Pseudomonas aeruginosa NOT DETECTED NOT DETECTED Final   Candida albicans NOT DETECTED NOT DETECTED Final   Candida glabrata NOT DETECTED NOT DETECTED Final   Candida krusei NOT DETECTED NOT DETECTED Final   Candida parapsilosis NOT DETECTED NOT DETECTED Final   Candida tropicalis NOT DETECTED NOT DETECTED Final   Carbapenem resistance NOT DETECTED NOT DETECTED Final   Methicillin resistance NOT DETECTED NOT DETECTED Final   Vancomycin resistance NOT DETECTED NOT DETECTED Final  MRSA PCR Screening     Status: None   Collection Time: 08/26/15 11:43 PM  Result Value Ref Range Status   MRSA by PCR NEGATIVE NEGATIVE Final    Comment:        The GeneXpert MRSA Assay (FDA approved for NASAL specimens only), is one component of a comprehensive MRSA  colonization surveillance program. It is not intended to diagnose MRSA infection nor to guide or monitor treatment for MRSA infections.   Culture, blood (Routine X 2) w Reflex to ID Panel     Status: None (Preliminary result)   Collection Time: 08/27/15 12:30 AM  Result Value Ref Range Status  Specimen Description BLOOD LEFT HAND  Final   Special Requests BOTTLES DRAWN AEROBIC AND ANAEROBIC  Final   Culture NO GROWTH 4 DAYS  Final   Report Status PENDING  Incomplete  Anaerobic culture     Status: None (Preliminary result)   Collection Time: 08/28/15  5:43 PM  Result Value Ref Range Status   Specimen Description HEEL  Final   Special Requests NONE  Final   Culture HOLDING FOR POSSIBLE ANAEROBE  Final   Report Status PENDING  Incomplete  Wound culture     Status: None (Preliminary result)   Collection Time: 08/28/15  5:43 PM  Result Value Ref Range Status   Specimen Description HEEL  Final   Special Requests NONE  Final   Gram Stain NO WBC SEEN NO ORGANISMS SEEN   Final   Culture   Final    MODERATE GROWTH PROTEUS MIRABILIS HOLDING FOR ADDITIONAL POSSIBLE PATHOGENS    Report Status PENDING  Incomplete   Organism ID, Bacteria PROTEUS MIRABILIS  Final      Susceptibility   Proteus mirabilis - MIC*    AMPICILLIN <=2 SENSITIVE Sensitive     CEFAZOLIN 8 SENSITIVE Sensitive     CEFTRIAXONE <=1 SENSITIVE Sensitive     CIPROFLOXACIN <=0.25 SENSITIVE Sensitive     GENTAMICIN <=1 SENSITIVE Sensitive     IMIPENEM 4 SENSITIVE Sensitive     NITROFURANTOIN 128 RESISTANT Resistant     TRIMETH/SULFA <=20 SENSITIVE Sensitive     PIP/TAZO Value in next row Sensitive      SENSITIVE<=4    * MODERATE GROWTH PROTEUS MIRABILIS    Coagulation Studies: No results for input(s): LABPROT, INR in the last 72 hours.  Urinalysis: No results for input(s): COLORURINE, LABSPEC, PHURINE, GLUCOSEU, HGBUR, BILIRUBINUR, KETONESUR, PROTEINUR, UROBILINOGEN, NITRITE, LEUKOCYTESUR in the last 72  hours.  Invalid input(s): APPERANCEUR    Imaging: Dg Chest Port 1 View  08/30/2015  CLINICAL DATA:  PICC placement; hx mi, dialysis, diabetes, cad, htn, and pvd EXAM: PORTABLE CHEST 1 VIEW COMPARISON:  08/26/2015 FINDINGS: A right-sided central line terminates at the superior caval/ atrial junction versus high right atrium. Poorly visualized centrally. Mild to moderate right hemidiaphragm elevation. Chin overlies the apices. Cardiomegaly accentuated by AP portable technique. Atherosclerosis in the transverse aorta. No pleural effusion or pneumothorax. Mild pulmonary interstitial thickening is nonspecific and chronic. Volume loss and bibasilar atelectasis or scarring at both lung bases. IMPRESSION: Central line poorly visualized centrally. Fell to terminate at the cavoatrial junction versus high right atrium. No pneumothorax or other acute process. Electronically Signed   By: Jeronimo Greaves M.D.   On: 08/30/2015 13:19     Medications:     . antiseptic oral rinse  7 mL Mouth Rinse q12n4p  . chlorhexidine  15 mL Mouth Rinse BID  . cinacalcet  90 mg Oral Daily  . epoetin (EPOGEN/PROCRIT) injection  10,000 Units Intravenous Q M,W,F-HD  . feeding supplement (NEPRO CARB STEADY)  237 mL Oral BID BM  . insulin aspart  0-9 Units Subcutaneous TID AC & HS  . lanthanum  500 mg Oral TID WC  . losartan  12.5 mg Oral Daily  . metoprolol tartrate  12.5 mg Oral BID  . pantoprazole  40 mg Oral BID  . piperacillin-tazobactam (ZOSYN)  IV  3.375 g Intravenous Q12H  . sevelamer carbonate  2.4 g Oral TID WC  . sodium chloride  3 mL Intravenous Q12H  . sodium chloride  3 mL Intravenous Q12H  sodium chloride, sodium chloride, sodium chloride, acetaminophen **OR** acetaminophen, albuterol, alteplase, heparin, ipratropium-albuterol, lidocaine (PF), lidocaine-prilocaine, morphine injection, morphine injection, ondansetron **OR** ondansetron (ZOFRAN) IV, oxyCODONE, oxyCODONE-acetaminophen,  pentafluoroprop-tetrafluoroeth  Assessment/ Plan:  81 y.o. black male past medical history of end-stage renal disease on hematemesis Monday, Wednesday, Friday, diabetes mellitus, coronary artery disease, hypertension, peripheral vascular disease, GERD, anemia chronic kidney disease, history of myocardial infarction, secondary hyperparathyroidism presented with fever and eschar on left heel.  [facility] MWF [facility] [place]  1. End-stage renal disease on hemodialysis Monday, Wednesday, Friday.  Hemodialysis for tomorrow.  Continue MWF schedule.   2. Anemia of chronic kidney disease. Hemoglobin currently 8.7 - epo with treatment  3. Secondary hyperparathyroidism. Phos 4.2, PTH 244. Corrected calcium 9.3 - continue sensipar, renvela, and fosrenol.  4. Hypertension. Blood pressure at goal -  continue losartan and metoprolol.   5. Bacteremia and chronic foot ulcer: blood culture growing proteus mirabilis.  - pip/tazo and vanco - appreciate podiatry input.    LOS: 5 Treva Huyett 1/1/201710:55 AM

## 2015-08-31 NOTE — Progress Notes (Signed)
Pt demonstrated usage of the incentive and reach 1000. Will continue to encourage.   [name]

## 2015-08-31 NOTE — Progress Notes (Signed)
[facility] [place] at [facility]   PATIENT NAME: [name]    MR#:  [id number]  DATE OF BIRTH:  [birth year]  SUBJECTIVE:  CHIEF COMPLAINT:   Chief Complaint  Patient presents with  . Fever    - Patient complaining of bilateral feet pain  End-stage renal patient admitted for PAD left heel ulceration for which he had debridement done in the OR on 08/28/2015 -Blood cultures on admission growing Proteus and Enterobacter, now on Zosyn. Poor IV access, line placement was formed by the vascular -Wound cultures from the OR are still pending. -Patient is afebrile. WBC has improved Patient feels overall comfortable. Denies any significant discomfort except for feet pain.  REVIEW OF SYSTEMS:  Review of Systems  Constitutional: Positive for chills. Negative for fever and weight loss.  HENT: Negative for congestion.   Eyes: Negative for blurred vision and double vision.  Respiratory: Positive for cough and sputum production. Negative for shortness of breath and wheezing.   Cardiovascular: Negative for chest pain, palpitations, orthopnea, leg swelling and PND.  Gastrointestinal: Negative for nausea, vomiting, abdominal pain, diarrhea, constipation and blood in stool.  Genitourinary: Negative for dysuria, urgency, frequency and hematuria.  Musculoskeletal: Negative for falls.  Neurological: Negative for dizziness, tremors, focal weakness and headaches.  Endo/Heme/Allergies: Does not bruise/bleed easily.  Psychiatric/Behavioral: Negative for depression. The patient does not have insomnia.    complains of pain in left heel   VITAL SIGNS:  VITAL SIGNS: Blood pressure 93/81, pulse 77, temperature 97.6 F (36.4 C), temperature source Oral, resp. rate 16, height 5\' 9"  (1.753 m), weight 100.744 kg (222 lb 1.6 oz), SpO2 100 %.  PHYSICAL EXAMINATION:   GENERAL:  81 y.o.-year-old patient lying in the bed with no acute distress. More alert and communicating  today. EYES: Pupils equal, round, reactive to light and accommodation. No scleral icterus. Extraocular muscles intact.  HEENT: Head atraumatic, normocephalic. Oropharynx and nasopharynx clear.  NECK:  Supple, no jugular venous distention. No thyroid enlargement, no tenderness.  LUNGS: Normal breath sounds bilaterally, no wheezing, rales,rhonchi or crepitation. No use of accessory muscles of respiration. Decreased bibasilar breath sounds noted CARDIOVASCULAR: S1, S2 normal. No murmurs, rubs, or gallops.  ABDOMEN: Soft, nontender, nondistended. Bowel sounds present. No organomegaly or mass.  EXTREMITIES: No pedal edema, cyanosis, or clubbing.  Bilateral feet dressing  NEUROLOGIC: Cranial nerves II through XII are intact. Muscle strength 5/5 in all extremities. Sensation intact. Gait not checked.  PSYCHIATRIC: The patient is alert and oriented x 3.  SKIN: No obvious rash, lesion, or ulcer.   ORDERS/RESULTS REVIEWED:   CBC  Recent Labs Lab 08/26/15 1915 08/27/15 0542 08/29/15 1125 08/30/15 0712 08/30/15 1738 08/31/15 0247 08/31/15 0936  WBC 14.2* 10.9* 9.2 7.8  --   --   --   HGB 10.3* 9.8* 8.4* 8.8* 8.6* 8.3* 8.7*  HCT 32.3* 29.7* 25.8* 26.9*  --   --   --   PLT 268 249 215 221  --   --   --   MCV 88.6 86.5 87.1 88.4  --   --   --   MCH 28.4 28.6 28.5 29.0  --   --   --   MCHC 32.1 33.1 32.7 32.9  --   --   --   RDW 15.6* 15.1* 15.0* 15.5*  --   --   --   LYMPHSABS 1.3  --   --   --   --   --   --  MONOABS 1.2*  --   --   --   --   --   --   EOSABS 0.0  --   --   --   --   --   --   BASOSABS 0.1  --   --   --   --   --   --    ------------------------------------------------------------------------------------------------------------------  Chemistries   Recent Labs Lab 08/26/15 1915 08/27/15 0542 08/29/15 1125 08/30/15 0712  NA 136 139 135 138  K 4.5 4.3 4.4 4.1  CL 92* 94* 95* 96*  CO2 31 31 27 31   GLUCOSE 229* 159* 144* 176*  BUN 31* 36* 37* 22*  CREATININE  6.21* 7.07* 7.98* 5.31*  CALCIUM 8.3* 8.5* 7.2* 7.7*  AST 19  --   --   --   ALT 14*  --   --   --   ALKPHOS 151*  --   --   --   BILITOT 0.8  --   --   --    ------------------------------------------------------------------------------------------------------------------ estimated creatinine clearance is 13.4 mL/min (by C-G formula based on Cr of 5.31). ------------------------------------------------------------------------------------------------------------------ No results for input(s): TSH, T4TOTAL, T3FREE, THYROIDAB in the last 72 hours.  Invalid input(s): FREET3  Cardiac Enzymes No results for input(s): CKMB, TROPONINI, MYOGLOBIN in the last 168 hours.  Invalid input(s): CK ------------------------------------------------------------------------------------------------------------------ Invalid input(s): POCBNP ---------------------------------------------------------------------------------------------------------------  RADIOLOGY: Dg Chest Port 1 View  08/30/2015  CLINICAL DATA:  PICC placement; hx mi, dialysis, diabetes, cad, htn, and pvd EXAM: PORTABLE CHEST 1 VIEW COMPARISON:  08/26/2015 FINDINGS: A right-sided central line terminates at the superior caval/ atrial junction versus high right atrium. Poorly visualized centrally. Mild to moderate right hemidiaphragm elevation. Chin overlies the apices. Cardiomegaly accentuated by AP portable technique. Atherosclerosis in the transverse aorta. No pleural effusion or pneumothorax. Mild pulmonary interstitial thickening is nonspecific and chronic. Volume loss and bibasilar atelectasis or scarring at both lung bases. IMPRESSION: Central line poorly visualized centrally. Fell to terminate at the cavoatrial junction versus high right atrium. No pneumothorax or other acute process. Electronically Signed   By: Jeronimo GreavesKyle  Talbot M.D.   On: 08/30/2015 13:19    EKG:  Orders placed or performed during the hospital encounter of 08/26/15  . EKG  12-Lead  . EKG 12-Lead    ASSESSMENT AND PLAN:  Principal Problem:   Sepsis (HCC) Active Problems:   ESRD (end stage renal disease) on dialysis (HCC)   CAD (coronary artery disease)   Gangrene of foot (HCC)   Type 2 diabetes mellitus (HCC)   GERD (gastroesophageal reflux disease)  1. Sepsis due to Proteus bacteremia-. Likely source is the left lower extremity gangrenous ulcer. -Appreciate podiatry consult. -Due to left heel gangrene/infected ulcer, continue broad-spectrum antibiotic therapy while waiting for the wound culture results from debridement yesterday. Likely to be able to discharge back to facility with Augmentin orally, unless recommended differently by podiatry.  - Dressing changes and weight bearing per podiatry - patient is usually bed bound at baseline/ wheelchair bound.  2. PAD with Left heel gangrene/infected ulcer - status post PCI by vascular surgery 2 weeks ago - Plavix was on hold for irrigation and debridement of left foot ulcer - Restarted plavix - appreciate podiatry input, continue broad-spectrum antibiotics for now until wound cultures unknown  3. End-stage renal disease: om M-W-F hemodialysis, nephrology consultation is obtained. Hemodialysis per schedule - had dialysis yesterday and next on Monday  4. Diabetes mellitus type 2, continue outpatient therapy and sliding scale insulin,  5.HTN, importance of today,  losartan and metoprolol on hold  6. Anemia of chronic disease- on epogen with dialysis. Stable  7. DVT Prophylaxis- on heparin SQ  8. Hematemesis. Patient is to continue Protonix orally. He was seen by gastroenterologist as recommended close follow-up, but no EGD at this point unless drop of hemoglobin  Management plans discussed with the patient, family (wife and son )and they are in agreement. Patient on 2L o2- wean as tolerated as not on home o2. Use incentive spirometry as needed.  Patient is from [facility] long term  care.   DRUG ALLERGIES:  Allergies  Allergen Reactions  . Contrast Media [Iodinated Diagnostic Agents] Hives  . Fish Allergy Hives    Shell fish    CODE STATUS:     Code Status Orders        Start     Ordered   08/26/15 2333  Full code   Continuous     08/26/15 2332      TOTAL TIME SPENT IN TAKING CARE OF THIS PATIENT: 35 minutes.    Katharina Caper M.D on 08/31/2015 at 12:28 PM  Between 7am to 6pm - Pager - [phone]  After 6pm go to [email or web address] - password [email or web address]  [facility]  Office  [phone]  CC: Primary care physician; No PCP Per Patient

## 2015-08-31 NOTE — Progress Notes (Signed)
GI Inpatient Follow-up Note  Patient Identification: Craig Manson PasseyLee Piechota is a 81 y.o. male with small amt hematemesis x 1  Subjective:  No further hematemesis.  Some heaving after breakfast today.  No stooling. No abd pain.  No f/c.   Scheduled Inpatient Medications:  . antiseptic oral rinse  7 mL Mouth Rinse q12n4p  . chlorhexidine  15 mL Mouth Rinse BID  . cinacalcet  90 mg Oral Daily  . epoetin (EPOGEN/PROCRIT) injection  10,000 Units Intravenous Q M,W,F-HD  . feeding supplement (NEPRO CARB STEADY)  237 mL Oral BID BM  . insulin aspart  0-9 Units Subcutaneous TID AC & HS  . lanthanum  500 mg Oral TID WC  . losartan  12.5 mg Oral Daily  . metoprolol tartrate  12.5 mg Oral BID  . pantoprazole  40 mg Oral BID  . piperacillin-tazobactam (ZOSYN)  IV  3.375 g Intravenous Q12H  . sevelamer carbonate  2.4 g Oral TID WC  . sodium chloride  3 mL Intravenous Q12H  . sodium chloride  3 mL Intravenous Q12H    Continuous Inpatient Infusions:     PRN Inpatient Medications:  sodium chloride, sodium chloride, sodium chloride, acetaminophen **OR** acetaminophen, albuterol, alteplase, heparin, ipratropium-albuterol, lidocaine (PF), lidocaine-prilocaine, morphine injection, morphine injection, ondansetron **OR** ondansetron (ZOFRAN) IV, oxyCODONE, oxyCODONE-acetaminophen, pentafluoroprop-tetrafluoroeth  Review of Systems: Constitutional: Weight is stable.  Eyes: No changes in vision. ENT: No oral lesions, sore throat.  GI: see HPI.  Heme/Lymph: No easy bruising.  CV: No chest pain.  GU: No hematuria.  Integumentary: No rashes.  Neuro: No headaches.  Psych: No depression/anxiety.  Endocrine: No heat/cold intolerance.  Allergic/Immunologic: No urticaria.  Resp: No cough, SOB.  Musculoskeletal: No joint swelling.    Physical Examination: BP 138/75 mmHg  Pulse 76  Temp(Src) 98.5 F (36.9 C) (Oral)  Resp 18  Ht 5\' 9"  (1.753 m)  Wt 100.744 kg (222 lb 1.6 oz)  BMI 32.78 kg/m2  SpO2  100% Gen: NAD, alert and oriented x 4 Neck: supple, no JVD or thyromegaly Chest: CTA bilaterally, no wheezes, crackles, or other adventitious sounds CV: RRR, no m/g/c/r Abd: soft, NT, ND, +BS in all four quadrants; no HSM, guarding, ridigity, or rebound tenderness Skin: no rash or lesions noted Lymph: no LAD  Data: Lab Results  Component Value Date   WBC 7.8 08/30/2015   HGB 8.7* 08/31/2015   HCT 26.9* 08/30/2015   MCV 88.4 08/30/2015   PLT 221 08/30/2015    Recent Labs Lab 08/30/15 1738 08/31/15 0247 08/31/15 0936  HGB 8.6* 8.3* 8.7*   Lab Results  Component Value Date   NA 138 08/30/2015   K 4.1 08/30/2015   CL 96* 08/30/2015   CO2 31 08/30/2015   BUN 22* 08/30/2015   CREATININE 5.31* 08/30/2015   Lab Results  Component Value Date   ALT 14* 08/26/2015   AST 19 08/26/2015   ALKPHOS 151* 08/26/2015   BILITOT 0.8 08/26/2015    Recent Labs Lab 08/27/15 0542  INR 1.34   Assessment/Plan: [name] is a 81 y.o. male with nickel size amt hematemesis x 1.  None since then.  No change in Hgb.   Risk of EGD would not outweight benefit based on this very small amt x 1.   Recommendations: - protonix 40 daily for 4 weeks - I will sign off.   Please call with questions or concerns.  Sylas Twombly, Addison NaegeliMATTHEW GORDON, MD

## 2015-09-01 DIAGNOSIS — L97519 Non-pressure chronic ulcer of other part of right foot with unspecified severity: Secondary | ICD-10-CM

## 2015-09-01 DIAGNOSIS — R531 Weakness: Secondary | ICD-10-CM

## 2015-09-01 DIAGNOSIS — L02612 Cutaneous abscess of left foot: Secondary | ICD-10-CM

## 2015-09-01 LAB — WOUND CULTURE: Gram Stain: NONE SEEN

## 2015-09-01 LAB — CBC
HCT: 28.1 % — ABNORMAL LOW (ref 40.0–52.0)
Hemoglobin: 9.2 g/dL — ABNORMAL LOW (ref 13.0–18.0)
MCH: 28.3 pg (ref 26.0–34.0)
MCHC: 32.8 g/dL (ref 32.0–36.0)
MCV: 86.3 fL (ref 80.0–100.0)
PLATELETS: 257 10*3/uL (ref 150–440)
RBC: 3.25 MIL/uL — AB (ref 4.40–5.90)
RDW: 14.9 % — ABNORMAL HIGH (ref 11.5–14.5)
WBC: 7.9 10*3/uL (ref 3.8–10.6)

## 2015-09-01 LAB — RENAL FUNCTION PANEL
Albumin: 2.2 g/dL — ABNORMAL LOW (ref 3.5–5.0)
Anion gap: 13 (ref 5–15)
BUN: 35 mg/dL — ABNORMAL HIGH (ref 6–20)
CHLORIDE: 94 mmol/L — AB (ref 101–111)
CO2: 28 mmol/L (ref 22–32)
CREATININE: 8.27 mg/dL — AB (ref 0.61–1.24)
Calcium: 7.4 mg/dL — ABNORMAL LOW (ref 8.9–10.3)
GFR calc non Af Amer: 5 mL/min — ABNORMAL LOW (ref >60)
GFR, EST AFRICAN AMERICAN: 6 mL/min — AB (ref >60)
GLUCOSE: 141 mg/dL — AB (ref 65–99)
Phosphorus: 4 mg/dL (ref 2.5–4.6)
Potassium: 4.1 mmol/L (ref 3.5–5.1)
Sodium: 135 mmol/L (ref 135–145)

## 2015-09-01 LAB — ANAEROBIC CULTURE

## 2015-09-01 LAB — GLUCOSE, CAPILLARY
Glucose-Capillary: 96 mg/dL (ref 65–99)
Glucose-Capillary: 100 mg/dL — ABNORMAL HIGH (ref 65–99)

## 2015-09-01 MED ORDER — PANTOPRAZOLE SODIUM 40 MG PO TBEC: 40.0000 mg | DELAYED_RELEASE_TABLET | Freq: Every day | ORAL | Status: AC

## 2015-09-01 MED ORDER — OXYCODONE HCL 5 MG PO TABS: 5.0000 mg | ORAL_TABLET | Freq: Four times a day (QID) | ORAL | Status: AC | PRN

## 2015-09-01 MED ORDER — AMOXICILLIN-POT CLAVULANATE 500-125 MG PO TABS: 1.0000 | ORAL_TABLET | Freq: Three times a day (TID) | ORAL | Status: DC

## 2015-09-01 MED ORDER — NEPRO/CARBSTEADY PO LIQD: 237.0000 mL | Freq: Two times a day (BID) | ORAL | Status: AC

## 2015-09-01 NOTE — Progress Notes (Signed)
Post hd tx 

## 2015-09-01 NOTE — Progress Notes (Signed)
Pre-hd tx 

## 2015-09-01 NOTE — Clinical Social Work Note (Signed)
Patient to discharge today to return to [facility]. Patient's wife was in patient's room and she is aware. Patient to transport via EMS. Discharge information sent to [facility]. York SpanielMonica Olivianna Higley MSW,LCSW [phone]

## 2015-09-01 NOTE — Progress Notes (Signed)
Hemodialysis treatment completed.

## 2015-09-01 NOTE — Progress Notes (Signed)
[facility]  ROUNDING NOTE   Subjective:   Seen and examined on hemodialysis. Tolerating treatment well. UF goal of 1.5 litres.   Continues on IV pip/tazo  Objective:  Vital signs in last 24 hours:  Temp:  [98 F (36.7 C)-98.5 F (36.9 C)] 98 F (36.7 C) (01/02 0910) Pulse Rate:  [66-80] 66 (01/02 1130) Resp:  [10-18] 13 (01/02 1130) BP: (110-143)/(51-75) 130/65 mmHg (01/02 1130) SpO2:  [97 %-100 %] 98 % (01/02 0910) Weight:  [104 kg (229 lb 4.5 oz)-104.01 kg (229 lb 4.8 oz)] 104 kg (229 lb 4.5 oz) (01/02 0910)  Weight change: 3.266 kg (7 lb 3.2 oz) Filed Weights   08/31/15 0459 09/01/15 0548 09/01/15 0910  Weight: 100.744 kg (222 lb 1.6 oz) 104.01 kg (229 lb 4.8 oz) 104 kg (229 lb 4.5 oz)    Intake/Output: I/O last 3 completed shifts: In: 340 [P.O.:340] Out: 0    Intake/Output this shift:  Total I/O In: 200 [P.O.:200] Out: 0   Physical Exam: General: NAD, resting in bed  Head: Normocephalic, atraumatic. Moist oral mucosal membranes  Eyes: Anicteric  Neck: Supple, trachea midline, RIJ central line  Lungs:  Clear to auscultation normal effort  Heart: Regular rate and rhythm  Abdomen:  Soft, nontender, BS present  Extremities:  no peripheral edema, bilateral ankles in clean and dry dressings  Neurologic: Nonfocal, moving all four extremities  Skin: Left heel in dressings  Access: LUE AVF    Basic Metabolic Panel:  Recent Labs Lab 08/26/15 1915 08/27/15 0542 08/27/15 1902 08/29/15 1125 08/30/15 0712 09/01/15 0935  NA 136 139  --  135 138 135  K 4.5 4.3  --  4.4 4.1 4.1  CL 92* 94*  --  95* 96* 94*  CO2 31 31  --  27 31 28   GLUCOSE 229* 159*  --  144* 176* 141*  BUN 31* 36*  --  37* 22* 35*  CREATININE 6.21* 7.07*  --  7.98* 5.31* 8.27*  CALCIUM 8.3* 8.5*  --  7.2* 7.7* 7.4*  PHOS  --   --  4.2 4.2  --  4.0    Liver Function Tests:  Recent Labs Lab 08/26/15 1915 08/29/15 1125 09/01/15 0935  AST 19  --   --   ALT 14*  --   --    ALKPHOS 151*  --   --   BILITOT 0.8  --   --   PROT 8.2*  --   --   ALBUMIN 2.6* 2.0* 2.2*   No results for input(s): LIPASE, AMYLASE in the last 168 hours. No results for input(s): AMMONIA in the last 168 hours.  CBC:  Recent Labs Lab 08/26/15 1915 08/27/15 0542 08/29/15 1125 08/30/15 0712 08/30/15 1738 08/31/15 0247 08/31/15 0936 09/01/15 0935  WBC 14.2* 10.9* 9.2 7.8  --   --   --  7.9  NEUTROABS 11.7*  --   --   --   --   --   --   --   HGB 10.3* 9.8* 8.4* 8.8* 8.6* 8.3* 8.7* 9.2*  HCT 32.3* 29.7* 25.8* 26.9*  --   --   --  28.1*  MCV 88.6 86.5 87.1 88.4  --   --   --  86.3  PLT 268 249 215 221  --   --   --  257    Cardiac Enzymes: No results for input(s): CKTOTAL, CKMB, CKMBINDEX, TROPONINI in the last 168 hours.  BNP: Invalid input(s): POCBNP  CBG:  Recent Labs Lab 08/31/15 0725 08/31/15 1130 08/31/15 1624 08/31/15 2115 09/01/15 0757  GLUCAP 120* 196* 154* 155* 96    Microbiology: Results for orders placed or performed during the hospital encounter of 08/26/15  Culture, blood (Routine X 2) w Reflex to ID Panel     Status: None   Collection Time: 08/26/15  8:58 PM  Result Value Ref Range Status   Specimen Description BLOOD RIGHT ANTECUBITAL  Final   Special Requests BOTTLES DRAWN AEROBIC AND ANAEROBIC  Final   Culture  Setup Time   Final    GRAM NEGATIVE RODS IN BOTH AEROBIC AND ANAEROBIC BOTTLES CRITICAL RESULT CALLED TO, READ BACK BY AND VERIFIED WITH: HENRY ZOMPA 08/27/15 1350 MLM    Culture PROTEUS MIRABILIS  Final   Report Status 08/29/2015 FINAL  Final   Organism ID, Bacteria PROTEUS MIRABILIS  Final      Susceptibility   Proteus mirabilis - MIC*    AMPICILLIN <=2 SENSITIVE Sensitive     CEFTAZIDIME <=1 SENSITIVE Sensitive     CEFAZOLIN <=4 SENSITIVE Sensitive     CEFTRIAXONE <=1 SENSITIVE Sensitive     CIPROFLOXACIN <=0.25 SENSITIVE Sensitive     GENTAMICIN <=1 SENSITIVE Sensitive     IMIPENEM 2 SENSITIVE Sensitive      TRIMETH/SULFA <=20 SENSITIVE Sensitive     PIP/TAZO Value in next row Sensitive      SENSITIVE<=4    * PROTEUS MIRABILIS  Blood Culture ID Panel (Reflexed)     Status: Abnormal   Collection Time: 08/26/15  8:58 PM  Result Value Ref Range Status   Enterococcus species NOT DETECTED NOT DETECTED Final   Listeria monocytogenes NOT DETECTED NOT DETECTED Final   Staphylococcus species NOT DETECTED NOT DETECTED Final   Staphylococcus aureus NOT DETECTED NOT DETECTED Final   Streptococcus species NOT DETECTED NOT DETECTED Final   Streptococcus agalactiae NOT DETECTED NOT DETECTED Final   Streptococcus pneumoniae NOT DETECTED NOT DETECTED Final   Streptococcus pyogenes NOT DETECTED NOT DETECTED Final   Acinetobacter baumannii NOT DETECTED NOT DETECTED Final   Enterobacteriaceae species DETECTED (A) NOT DETECTED Final    Comment: CRITICAL RESULT CALLED TO, READ BACK BY AND VERIFIED WITH: HENRY ZOMPA 08/27/15 1350 MLM    Enterobacter cloacae complex NOT DETECTED NOT DETECTED Final   Escherichia coli NOT DETECTED NOT DETECTED Final   Klebsiella oxytoca NOT DETECTED NOT DETECTED Final   Klebsiella pneumoniae NOT DETECTED NOT DETECTED Final   Proteus species DETECTED (A) NOT DETECTED Final    Comment: CRITICAL RESULT CALLED TO, READ BACK BY AND VERIFIED WITH: HENRY ZOMPA 08/27/15 1350 MLM    Serratia marcescens NOT DETECTED NOT DETECTED Final   Haemophilus influenzae NOT DETECTED NOT DETECTED Final   Neisseria meningitidis NOT DETECTED NOT DETECTED Final   Pseudomonas aeruginosa NOT DETECTED NOT DETECTED Final   Candida albicans NOT DETECTED NOT DETECTED Final   Candida glabrata NOT DETECTED NOT DETECTED Final   Candida krusei NOT DETECTED NOT DETECTED Final   Candida parapsilosis NOT DETECTED NOT DETECTED Final   Candida tropicalis NOT DETECTED NOT DETECTED Final   Carbapenem resistance NOT DETECTED NOT DETECTED Final   Methicillin resistance NOT DETECTED NOT DETECTED Final   Vancomycin  resistance NOT DETECTED NOT DETECTED Final  MRSA PCR Screening     Status: None   Collection Time: 08/26/15 11:43 PM  Result Value Ref Range Status   MRSA by PCR NEGATIVE NEGATIVE Final    Comment:  The GeneXpert MRSA Assay (FDA approved for NASAL specimens only), is one component of a comprehensive MRSA colonization surveillance program. It is not intended to diagnose MRSA infection nor to guide or monitor treatment for MRSA infections.   Culture, blood (Routine X 2) w Reflex to ID Panel     Status: None (Preliminary result)   Collection Time: 08/27/15 12:30 AM  Result Value Ref Range Status   Specimen Description BLOOD LEFT HAND  Final   Special Requests BOTTLES DRAWN AEROBIC AND ANAEROBIC  Final   Culture NO GROWTH 4 DAYS  Final   Report Status PENDING  Incomplete  Anaerobic culture     Status: None   Collection Time: 08/28/15  5:43 PM  Result Value Ref Range Status   Specimen Description HEEL  Final   Special Requests NONE  Final   Culture NO ANAEROBES ISOLATED  Final   Report Status 09/01/2015 FINAL  Final  Wound culture     Status: None   Collection Time: 08/28/15  5:43 PM  Result Value Ref Range Status   Specimen Description HEEL  Final   Special Requests NONE  Final   Gram Stain NO WBC SEEN NO ORGANISMS SEEN   Final   Culture   Final    MODERATE GROWTH PROTEUS MIRABILIS LIGHT GROWTH STAPHYLOCOCCUS AUREUS    Report Status 09/01/2015 FINAL  Final   Organism ID, Bacteria PROTEUS MIRABILIS  Final   Organism ID, Bacteria STAPHYLOCOCCUS AUREUS  Final      Susceptibility   Staphylococcus aureus - MIC*    CIPROFLOXACIN >=8 RESISTANT Resistant     GENTAMICIN <=0.5 SENSITIVE Sensitive     OXACILLIN 2 SENSITIVE Sensitive     TRIMETH/SULFA <=10 SENSITIVE Sensitive     CEFOXITIN SCREEN NEGATIVE Sensitive     Inducible Clindamycin NEGATIVE Sensitive     TETRACYCLINE Value in next row Sensitive      SENSITIVE<=1    * LIGHT GROWTH STAPHYLOCOCCUS AUREUS    Proteus mirabilis - MIC*    AMPICILLIN Value in next row Sensitive      SENSITIVE<=1    CEFAZOLIN Value in next row Sensitive      SENSITIVE<=1    CEFTRIAXONE Value in next row Sensitive      SENSITIVE<=1    CIPROFLOXACIN Value in next row Sensitive      SENSITIVE<=1    GENTAMICIN Value in next row Sensitive      SENSITIVE<=1    IMIPENEM Value in next row Sensitive      SENSITIVE<=1    NITROFURANTOIN Value in next row Resistant      SENSITIVE<=1    TRIMETH/SULFA Value in next row Sensitive      SENSITIVE<=1    PIP/TAZO Value in next row Sensitive      SENSITIVE<=4    * MODERATE GROWTH PROTEUS MIRABILIS    Coagulation Studies: No results for input(s): LABPROT, INR in the last 72 hours.  Urinalysis: No results for input(s): COLORURINE, LABSPEC, PHURINE, GLUCOSEU, HGBUR, BILIRUBINUR, KETONESUR, PROTEINUR, UROBILINOGEN, NITRITE, LEUKOCYTESUR in the last 72 hours.  Invalid input(s): APPERANCEUR    Imaging: Dg Chest Port 1 View  08/30/2015  CLINICAL DATA:  PICC placement; hx mi, dialysis, diabetes, cad, htn, and pvd EXAM: PORTABLE CHEST 1 VIEW COMPARISON:  08/26/2015 FINDINGS: A right-sided central line terminates at the superior caval/ atrial junction versus high right atrium. Poorly visualized centrally. Mild to moderate right hemidiaphragm elevation. Chin overlies the apices. Cardiomegaly accentuated by AP portable technique. Atherosclerosis in the  transverse aorta. No pleural effusion or pneumothorax. Mild pulmonary interstitial thickening is nonspecific and chronic. Volume loss and bibasilar atelectasis or scarring at both lung bases. IMPRESSION: Central line poorly visualized centrally. Fell to terminate at the cavoatrial junction versus high right atrium. No pneumothorax or other acute process. Electronically Signed   By: Jeronimo Greaves M.D.   On: 08/30/2015 13:19     Medications:     . antiseptic oral rinse  7 mL Mouth Rinse q12n4p  . chlorhexidine  15 mL Mouth Rinse BID  .  cinacalcet  90 mg Oral Daily  . epoetin (EPOGEN/PROCRIT) injection  10,000 Units Intravenous Q M,W,F-HD  . feeding supplement (NEPRO CARB STEADY)  237 mL Oral BID BM  . insulin aspart  0-9 Units Subcutaneous TID AC & HS  . lanthanum  500 mg Oral TID WC  . losartan  12.5 mg Oral Daily  . metoprolol tartrate  12.5 mg Oral BID  . pantoprazole  40 mg Oral BID  . piperacillin-tazobactam (ZOSYN)  IV  3.375 g Intravenous Q12H  . sevelamer carbonate  2.4 g Oral TID WC  . sodium chloride  3 mL Intravenous Q12H  . sodium chloride  3 mL Intravenous Q12H   sodium chloride, sodium chloride, sodium chloride, acetaminophen **OR** acetaminophen, albuterol, alteplase, heparin, ipratropium-albuterol, lidocaine (PF), lidocaine-prilocaine, morphine injection, morphine injection, ondansetron **OR** ondansetron (ZOFRAN) IV, oxyCODONE, oxyCODONE-acetaminophen, pentafluoroprop-tetrafluoroeth  Assessment/ Plan:  81 y.o. black male past medical history of end-stage renal disease on hematemesis Monday, Wednesday, Friday, diabetes mellitus, coronary artery disease, hypertension, peripheral vascular disease, GERD, anemia chronic kidney disease, history of myocardial infarction, secondary hyperparathyroidism presented with fever and eschar on left heel.  [facility] Nephrology MWF [facility] [place]  1. End-stage renal disease on hemodialysis Monday, Wednesday, Friday.  Hemodialysis treatment. Tolerating treatment.  Continue MWF schedule.   2. Anemia of chronic kidney disease. Hemoglobin currently 9.2 - epo with treatment  3. Secondary hyperparathyroidism. Phos 4.2, PTH 244. Corrected calcium 9.3 - continue sensipar, renvela, and lanthanum.  4. Hypertension. Blood pressure at goal -  continue losartan and metoprolol.   5. Bacteremia and chronic foot ulcer: blood culture growing proteus mirabilis.  - pip/tazo and vanco. Will need to be transitioned to PO antibiotics.  - appreciate podiatry input.    LOS: 6 [name],  Anureet Bruington 1/2/201711:50 AM

## 2015-09-01 NOTE — Progress Notes (Signed)
Patient left via stretcher escorted by EMS. 

## 2015-09-01 NOTE — Care Management Important Message (Signed)
Important Message  Patient Details  Name: [name] MRN: [id number] Date of Birth: [birth year]   Medicare Important Message Given:  Yes    Olegario MessierKathy A Emberlin Verner 09/01/2015, 10:43 AM

## 2015-09-01 NOTE — Progress Notes (Signed)
4 Days Post-Op  Subjective: Patient seen. Just returned from dialysis. No complaints  Objective: Vital signs in last 24 hours: Temp:  [98 F (36.7 C)-98.5 F (36.9 C)] 98.1 F (36.7 C) (01/02 1314) Pulse Rate:  [66-80] 79 (01/02 1314) Resp:  [10-18] 17 (01/02 1314) BP: (110-147)/(51-96) 113/96 mmHg (01/02 1314) SpO2:  [97 %-100 %] 99 % (01/02 1314) Weight:  [103.6 kg (228 lb 6.3 oz)-104.01 kg (229 lb 4.8 oz)] 103.6 kg (228 lb 6.3 oz) (01/02 1230) Last BM Date: 08/30/15  Intake/Output from previous day: 01/01 0701 - 01/02 0700 In: 340 [P.O.:340] Out: 0  Intake/Output this shift: Total I/O In: 400 [P.O.:400] Out: 1500 [Other:1500]  Only mild bleeding and drainage on the bandaging. No significant purulence. Erythema and edema continues to improve. Overall looks stable. Mepitel dressing and antibiotic beads are intact  Lab Results:   Recent Labs  08/30/15 0712  08/31/15 0936 09/01/15 0935  WBC 7.8  --   --  7.9  HGB 8.8*  < > 8.7* 9.2*  HCT 26.9*  --   --  28.1*  PLT 221  --   --  257  < > = values in this interval not displayed. BMET  Recent Labs  08/30/15 0712 09/01/15 0935  NA 138 135  K 4.1 4.1  CL 96* 94*  CO2 31 28  GLUCOSE 176* 141*  BUN 22* 35*  CREATININE 5.31* 8.27*  CALCIUM 7.7* 7.4*   PT/INR No results for input(s): LABPROT, INR in the last 72 hours. ABG No results for input(s): PHART, HCO3 in the last 72 hours.  Invalid input(s): PCO2, PO2  Studies/Results: No results found.  Anti-infectives: Anti-infectives    Start     Dose/Rate Route Frequency Ordered Stop   09/01/15 0000  amoxicillin-clavulanate (AUGMENTIN) 500-125 MG tablet    Comments:  Please take antibiotic morning dose after hemodialysis. Thank you,   1 tablet Oral 3 times daily 09/01/15 1139     08/27/15 1200  vancomycin (VANCOCIN) IVPB 1000 mg/200 mL premix  Status:  Discontinued     1,000 mg 200 mL/hr over 60 Minutes Intravenous Every M-W-F (Hemodialysis) 08/27/15 0058  08/30/15 1231   08/27/15 1000  piperacillin-tazobactam (ZOSYN) IVPB 3.375 g     3.375 g 12.5 mL/hr over 240 Minutes Intravenous Every 12 hours 08/27/15 0058     08/27/15 0100  vancomycin (VANCOCIN) 1,750 mg in sodium chloride 0.9 % 500 mL IVPB     1,750 mg 250 mL/hr over 120 Minutes Intravenous  Once 08/27/15 0058 08/27/15 0350   08/26/15 2100  piperacillin-tazobactam (ZOSYN) IVPB 3.375 g     3.375 g 12.5 mL/hr over 240 Minutes Intravenous  Once 08/26/15 2056 08/26/15 2131      Assessment/Plan: s/p Procedure(s): IRRIGATION AND DEBRIDEMENT LEFT HEEL, IMPLANTATION OF ANTIBIOTIC BEADS (Left) Assessment: Full-thickness ulceration with abscess status post debridement, stable   Plan: A dry dressing was reapplied to the wound. At this point his foot looks stable enough for discharge and we will monitor outpatient for healing progression.  LOS: 6 days    Ronelle Michie W. 09/01/2015

## 2015-09-01 NOTE — Progress Notes (Signed)
Hemodialysis start 

## 2015-09-01 NOTE — Discharge Summary (Signed)
[facility] -  at [facility]   PATIENT NAME: [name]    MR#:  [id number]  DATE OF BIRTH:  Jan 29, 1935  DATE OF ADMISSION:  08/26/2015 ADMITTING PHYSICIAN: Oralia Manis, MD  DATE OF DISCHARGE: No discharge date for patient encounter.  PRIMARY CARE PHYSICIAN: No PCP Per Patient     ADMISSION DIAGNOSIS:  Gangrene of foot (HCC) [I96]  DISCHARGE DIAGNOSIS:  Principal Problem:   Sepsis (HCC) Active Problems:   Gangrene of foot (HCC)   Abscess of left heel   Right foot ulcer (HCC)   ESRD (end stage renal disease) on dialysis (HCC)   CAD (coronary artery disease)   Type 2 diabetes mellitus (HCC)   GERD (gastroesophageal reflux disease)   Generalized weakness   SECONDARY DIAGNOSIS:   Past Medical History  Diagnosis Date  . Acute MI, lateral wall, initial episode of care (HCC)   . ESRD on dialysis [facility])     on HD M/W/F  . Diabetes (HCC)   . CAD (coronary artery disease) 06/25/2014    Mild LAD disease, circumflex 90%-->0% with  2.75 x 16 Promus DES, RCA subtotally occluded with left-right collaterals, EF 45-50% by echo    . HTN (hypertension)   . PVD (peripheral vascular disease) (HCC)   . GERD (gastroesophageal reflux disease)     .pro HOSPITAL COURSE:   Patient is 81 year old African-American male with history of end-stage renal disease who is on hemodialysis, also history of diabetes, hypertension who presents to the hospital with complaints of fever to 101.3. Tachycardia and elevated blood cell count. Exam in emergency room revealed the left heel ulceration, drainage and eschar. Chest x-ray was unremarkable. Left foot x-ray revealed plantar hindfoot soft tissue defect representing ulcer but no evidence of foreign body or bony destruction to suggest osteomyelitis. Patient was admitted to the hospital for broad-spectrum antibiotic therapy and consultation with podiatrist, Dr. Alberteen Spindle was obtained. Dr. Alberteen Spindle felt that patient needs to have  left foot irrigation and debridement done of abscess, patient was agreeable and procedure was performed on 08/28/2015. Initial patient's blood cultures grew Proteus species , Enterobacteriaceaa species . Proteus was pansensitive . Wound culture done 29th of December revealed Proteus mirabilis and Staphylococcus aureus, Proteus was pansensitive Staphylococcus aureus, methicillin sensitive . No anaerobes were isolated.  Postprocedure patient felt satisfactory, except for an episode of hematemesis for which gastroenterology consultation was obtained . Patient's hemoglobin remained stable and gastroenterologist did not feel that any intervention was needed  but stopping aspirin and initiating Protonix at 40 mg once daily dose for 1 month was recommended. The patient did not complain of any significant discomfort and was felt to be stable to be discharged home on the 09/01/2015.  Patient is being discharged home on Augmentin orally.  Discussion by problem  . Sepsis due to Proteus bacteremia-. Likely source is the left lower extremity gangrenous ulcer. -Appreciate podiatry consult. -Operative wound culture revealed Proteus as well as methicillin sensitive Staphylococcus aureus. Discharge patient back to facility on Augmentin orally, patient is to follow-up with podiatrist and wound care nursing staff - Dressing changes and weight bearing per podiatry - patient is usually bed bound at baseline/ wheelchair bound. He would benefit from feet protective boots when patient is supine to protect his heels  2. PAD with Left heel gangrene/infected ulcer - status post PCI by vascular surgery 2 weeks ago - Plavix was on hold for irrigation and debridement of left foot ulcer, now aspirin as well as Plavix are  on hold due to recent hematemesis, should be restarted within 1 week if no recurrence of hematemesis   3. End-stage renal disease: om M-W-F hemodialysis, nephrology consultation was obtained while in the hospital.  Patient received Hemodialysis per schedule, last one today, January 2nd 2017 -  4. Diabetes mellitus type 2, continue outpatient therapy and sliding scale insulin,   5.HTN, patient's blood pressure has been fluctuating recently. It is important to watch blood pressure readings closely and make decisions about dosing metoprolol and losartan depending on blood pressure readings. Patient's blood pressure medications were resumed upon discharge  6. Anemia of chronic disease- on epogen with dialysis. Stable  7. DVT Prophylaxis- on heparin SQ  8. Hematemesis. Patient is to continue Protonix orally for 4 weeks. He was seen by gastroenterologist while in the hospital , but no EGD was recommended. Hemoglobin level remained stable. Follow as outpatient  DISCHARGE CONDITIONS:   Stable  CONSULTS OBTAINED:  Treatment Team:  Mady HaagensenMunsoor Lateef, MD Linus Galasodd Cline, MD Camillo FlamingKamran Lateef, MD Elnita MaxwellMatthew Gordon Rein, MD  DRUG ALLERGIES:   Allergies  Allergen Reactions  . Contrast Media [Iodinated Diagnostic Agents] Hives  . Fish Allergy Hives    Shell fish    DISCHARGE MEDICATIONS:   Current Discharge Medication List    START taking these medications   Details  amoxicillin-clavulanate (AUGMENTIN) 500-125 MG tablet Take 1 tablet (500 mg total) by mouth 3 (three) times daily. Qty: 42 tablet, Refills: 1    !! Nutritional Supplements (FEEDING SUPPLEMENT, NEPRO CARB STEADY,) LIQD Take 237 mLs by mouth 2 (two) times daily between meals. Qty: 60 Can, Refills: 5    pantoprazole (PROTONIX) 40 MG tablet Take 1 tablet (40 mg total) by mouth daily. Switch for any other PPI at similar dose and frequency Qty: 30 tablet, Refills: 3     !! - Potential duplicate medications found. Please discuss with provider.    CONTINUE these medications which have CHANGED   Details  oxyCODONE (OXY IR/ROXICODONE) 5 MG immediate release tablet Take 1 tablet (5 mg total) by mouth every 6 (six) hours as needed (pain). Qty: 30 tablet,  Refills: 0      CONTINUE these medications which have NOT CHANGED   Details  acetaminophen (TYLENOL) 325 MG tablet Take 650 mg by mouth 3 (three) times daily.    albuterol (PROVENTIL HFA;VENTOLIN HFA) 108 (90 BASE) MCG/ACT inhaler Inhale 2 puffs into the lungs every 6 (six) hours as needed for wheezing or shortness of breath.    b complex-vitamin c-folic acid (NEPHRO-VITE) 0.8 MG TABS tablet Take 1 tablet by mouth daily.     calcium elemental as carbonate (TUMS ULTRA 1000) 400 MG chewable tablet Chew 3,000 mg by mouth 3 (three) times daily with meals.    cinacalcet (SENSIPAR) 90 MG tablet Take 90 mg by mouth daily.    clopidogrel (PLAVIX) 75 MG tablet Take 75 mg by mouth daily.    ferrous sulfate 325 (65 FE) MG tablet Take 325 mg by mouth 2 (two) times daily with a meal.    furosemide (LASIX) 20 MG tablet Take 20 mg by mouth daily.    gabapentin (NEURONTIN) 100 MG capsule Take 100 mg by mouth 2 (two) times daily.    insulin glargine (LANTUS) 100 UNIT/ML injection Inject 8 Units into the skin every morning.     Ipratropium-Albuterol (COMBIVENT RESPIMAT) 20-100 MCG/ACT AERS respimat Inhale 2 puffs into the lungs every 6 (six) hours as needed for wheezing or shortness of breath.    isosorbide  mononitrate (IMDUR) 30 MG 24 hr tablet Take 30 mg by mouth daily.    lanthanum (FOSRENOL) 1000 MG chewable tablet Chew 500 mg by mouth 3 (three) times daily with meals.    losartan (COZAAR) 25 MG tablet Take 1 tablet (25 mg total) by mouth daily. Qty: 30 tablet, Refills: 11    metoprolol tartrate (LOPRESSOR) 25 MG tablet Take 12.5 mg by mouth 2 (two) times daily.    nitroGLYCERIN (NITROSTAT) 0.6 MG SL tablet Place 0.6 mg under the tongue every 5 (five) minutes as needed for chest pain.    !! Nutritional Supplements (NEPRO) LIQD Take 8 oz by mouth 2 (two) times daily.    sevelamer carbonate (RENVELA) 2.4 G PACK Take 2.4 g by mouth 3 (three) times daily with meals.    sitaGLIPtin (JANUVIA)  25 MG tablet Take 25 mg by mouth daily.     !! - Potential duplicate medications found. Please discuss with provider.    STOP taking these medications     aspirin EC 81 MG tablet      omeprazole (PRILOSEC OTC) 20 MG tablet          DISCHARGE INSTRUCTIONS:    Patient is to follow-up with primary care physician in podiatry  If you experience worsening of your admission symptoms, develop shortness of breath, life threatening emergency, suicidal or homicidal thoughts you must seek medical attention immediately by calling 911 or calling your MD immediately  if symptoms less severe.  You Must read complete instructions/literature along with all the possible adverse reactions/side effects for all the Medicines you take and that have been prescribed to you. Take any new Medicines after you have completely understood and accept all the possible adverse reactions/side effects.   Please note  You were cared for by a hospitalist during your hospital stay. If you have any questions about your discharge medications or the care you received while you were in the hospital after you are discharged, you can call the unit and asked to speak with the hospitalist on call if the hospitalist that took care of you is not available. Once you are discharged, your primary care physician will handle any further medical issues. Please note that NO REFILLS for any discharge medications will be authorized once you are discharged, as it is imperative that you return to your primary care physician (or establish a relationship with a primary care physician if you do not have one) for your aftercare needs so that they can reassess your need for medications and monitor your lab values.    Today   CHIEF COMPLAINT:   Chief Complaint  Patient presents with  . Fever    HISTORY OF PRESENT ILLNESS:  [name]  is a 81 y.o. male with a known history of  end-stage renal disease who is on hemodialysis, also history of  diabetes, hypertension who presents to the hospital with complaints of fever to 101.3. Tachycardia and elevated blood cell count. Exam in emergency room revealed the left heel ulceration, drainage and eschar. Chest x-ray was unremarkable. Left foot x-ray revealed plantar hindfoot soft tissue defect representing ulcer but no evidence of foreign body or bony destruction to suggest osteomyelitis. Patient was admitted to the hospital for broad-spectrum antibiotic therapy and consultation with podiatrist, Dr. Alberteen Spindle was obtained. Dr. Alberteen Spindle felt that patient needs to have left foot irrigation and debridement done of abscess, patient was agreeable and procedure was performed on 08/28/2015. Initial patient's blood cultures grew Proteus species , Enterobacteriaceaa species .  Proteus was pansensitive . Wound culture done 29th of December revealed Proteus mirabilis and Staphylococcus aureus, Proteus was pansensitive Staphylococcus aureus, methicillin sensitive . No anaerobes were isolated.  Postprocedure patient felt satisfactory, except for an episode of hematemesis for which gastroenterology consultation was obtained . Patient's hemoglobin remained stable and gastroenterologist did not feel that any intervention was needed  but stopping aspirin and initiating Protonix at 40 mg once daily dose for 1 month was recommended. The patient did not complain of any significant discomfort and was felt to be stable to be discharged home on the 09/01/2015.  Patient is being discharged home on Augmentin orally.  Discussion by problem  . Sepsis due to Proteus bacteremia-. Likely source is the left lower extremity gangrenous ulcer. -Appreciate podiatry consult. -Operative wound culture revealed Proteus as well as methicillin sensitive Staphylococcus aureus. Discharge patient back to facility on Augmentin orally, patient is to follow-up with podiatrist and wound care nursing staff - Dressing changes and weight bearing per podiatry -  patient is usually bed bound at baseline/ wheelchair bound. He would benefit from feet protective boots when patient is supine to protect his heels  2. PAD with Left heel gangrene/infected ulcer - status post PCI by vascular surgery 2 weeks ago - Plavix was on hold for irrigation and debridement of left foot ulcer, now aspirin as well as Plavix are on hold due to recent hematemesis, should be restarted within 1 week if no recurrence of hematemesis   3. End-stage renal disease: om M-W-F hemodialysis, nephrology consultation was obtained while in the hospital. Patient received Hemodialysis per schedule, last one today, January 2nd 2017 -  4. Diabetes mellitus type 2, continue outpatient therapy and sliding scale insulin,   5.HTN, patient's blood pressure has been fluctuating recently. It is important to watch blood pressure readings closely and make decisions about dosing metoprolol and losartan depending on blood pressure readings. Patient's blood pressure medications were resumed upon discharge  6. Anemia of chronic disease- on epogen with dialysis. Stable  7. DVT Prophylaxis- on heparin SQ  8. Hematemesis. Patient is to continue Protonix orally for 4 weeks. He was seen by gastroenterologist while in the hospital , but no EGD was recommended. Hemoglobin level remained stable. Follow as outpatient   VITAL SIGNS:  Blood pressure 147/65, pulse 72, temperature 98 F (36.7 C), temperature source Oral, resp. rate 14, height 5\' 9"  (1.753 m), weight 103.6 kg (228 lb 6.3 oz), SpO2 98 %.  I/O:   Intake/Output Summary (Last 24 hours) at 09/01/15 1311 Last data filed at 09/01/15 1300  Gross per 24 hour  Intake    740 ml  Output   1500 ml  Net   -760 ml    PHYSICAL EXAMINATION:  GENERAL:  81 y.o.-year-old patient lying in the bed with no acute distress.  EYES: Pupils equal, round, reactive to light and accommodation. No scleral icterus. Extraocular muscles intact.  HEENT: Head atraumatic,  normocephalic. Oropharynx and nasopharynx clear.  NECK:  Supple, no jugular venous distention. No thyroid enlargement, no tenderness.  LUNGS: Normal breath sounds bilaterally, no wheezing, rales,rhonchi or crepitation. No use of accessory muscles of respiration.  CARDIOVASCULAR: S1, S2 normal. No murmurs, rubs, or gallops.  ABDOMEN: Soft, non-tender, non-distended. Bowel sounds present. No organomegaly or mass.  EXTREMITIES: No pedal edema, cyanosis, or clubbing.  NEUROLOGIC: Cranial nerves II through XII are intact. Muscle strength 5/5 in all extremities. Sensation intact. Gait not checked.  PSYCHIATRIC: The patient is alert and oriented x 3.  SKIN: No obvious rash, lesion, or ulcer.   DATA REVIEW:   CBC  Recent Labs Lab 09/01/15 0935  WBC 7.9  HGB 9.2*  HCT 28.1*  PLT 257    Chemistries   Recent Labs Lab 08/26/15 1915  09/01/15 0935  NA 136  < > 135  K 4.5  < > 4.1  CL 92*  < > 94*  CO2 31  < > 28  GLUCOSE 229*  < > 141*  BUN 31*  < > 35*  CREATININE 6.21*  < > 8.27*  CALCIUM 8.3*  < > 7.4*  AST 19  --   --   ALT 14*  --   --   ALKPHOS 151*  --   --   BILITOT 0.8  --   --   < > = values in this interval not displayed.  Cardiac Enzymes No results for input(s): TROPONINI in the last 168 hours.  Microbiology Results  Results for orders placed or performed during the hospital encounter of 08/26/15  Culture, blood (Routine X 2) w Reflex to ID Panel     Status: None   Collection Time: 08/26/15  8:58 PM  Result Value Ref Range Status   Specimen Description BLOOD RIGHT ANTECUBITAL  Final   Special Requests BOTTLES DRAWN AEROBIC AND ANAEROBIC  Final   Culture  Setup Time   Final    GRAM NEGATIVE RODS IN BOTH AEROBIC AND ANAEROBIC BOTTLES CRITICAL RESULT CALLED TO, READ BACK BY AND VERIFIED WITH: HENRY ZOMPA 08/27/15 1350 MLM    Culture PROTEUS MIRABILIS  Final   Report Status 08/29/2015 FINAL  Final   Organism ID, Bacteria PROTEUS MIRABILIS  Final       Susceptibility   Proteus mirabilis - MIC*    AMPICILLIN <=2 SENSITIVE Sensitive     CEFTAZIDIME <=1 SENSITIVE Sensitive     CEFAZOLIN <=4 SENSITIVE Sensitive     CEFTRIAXONE <=1 SENSITIVE Sensitive     CIPROFLOXACIN <=0.25 SENSITIVE Sensitive     GENTAMICIN <=1 SENSITIVE Sensitive     IMIPENEM 2 SENSITIVE Sensitive     TRIMETH/SULFA <=20 SENSITIVE Sensitive     PIP/TAZO Value in next row Sensitive      SENSITIVE<=4    * PROTEUS MIRABILIS  Blood Culture ID Panel (Reflexed)     Status: Abnormal   Collection Time: 08/26/15  8:58 PM  Result Value Ref Range Status   Enterococcus species NOT DETECTED NOT DETECTED Final   Listeria monocytogenes NOT DETECTED NOT DETECTED Final   Staphylococcus species NOT DETECTED NOT DETECTED Final   Staphylococcus aureus NOT DETECTED NOT DETECTED Final   Streptococcus species NOT DETECTED NOT DETECTED Final   Streptococcus agalactiae NOT DETECTED NOT DETECTED Final   Streptococcus pneumoniae NOT DETECTED NOT DETECTED Final   Streptococcus pyogenes NOT DETECTED NOT DETECTED Final   Acinetobacter baumannii NOT DETECTED NOT DETECTED Final   Enterobacteriaceae species DETECTED (A) NOT DETECTED Final    Comment: CRITICAL RESULT CALLED TO, READ BACK BY AND VERIFIED WITH: HENRY ZOMPA 08/27/15 1350 MLM    Enterobacter cloacae complex NOT DETECTED NOT DETECTED Final   Escherichia coli NOT DETECTED NOT DETECTED Final   Klebsiella oxytoca NOT DETECTED NOT DETECTED Final   Klebsiella pneumoniae NOT DETECTED NOT DETECTED Final   Proteus species DETECTED (A) NOT DETECTED Final    Comment: CRITICAL RESULT CALLED TO, READ BACK BY AND VERIFIED WITH: HENRY ZOMPA 08/27/15 1350 MLM    Serratia marcescens NOT DETECTED NOT DETECTED Final  Haemophilus influenzae NOT DETECTED NOT DETECTED Final   Neisseria meningitidis NOT DETECTED NOT DETECTED Final   Pseudomonas aeruginosa NOT DETECTED NOT DETECTED Final   Candida albicans NOT DETECTED NOT DETECTED Final   Candida  glabrata NOT DETECTED NOT DETECTED Final   Candida krusei NOT DETECTED NOT DETECTED Final   Candida parapsilosis NOT DETECTED NOT DETECTED Final   Candida tropicalis NOT DETECTED NOT DETECTED Final   Carbapenem resistance NOT DETECTED NOT DETECTED Final   Methicillin resistance NOT DETECTED NOT DETECTED Final   Vancomycin resistance NOT DETECTED NOT DETECTED Final  MRSA PCR Screening     Status: None   Collection Time: 08/26/15 11:43 PM  Result Value Ref Range Status   MRSA by PCR NEGATIVE NEGATIVE Final    Comment:        The GeneXpert MRSA Assay (FDA approved for NASAL specimens only), is one component of a comprehensive MRSA colonization surveillance program. It is not intended to diagnose MRSA infection nor to guide or monitor treatment for MRSA infections.   Culture, blood (Routine X 2) w Reflex to ID Panel     Status: None (Preliminary result)   Collection Time: 08/27/15 12:30 AM  Result Value Ref Range Status   Specimen Description BLOOD LEFT HAND  Final   Special Requests BOTTLES DRAWN AEROBIC AND ANAEROBIC  Final   Culture NO GROWTH 4 DAYS  Final   Report Status PENDING  Incomplete  Anaerobic culture     Status: None   Collection Time: 08/28/15  5:43 PM  Result Value Ref Range Status   Specimen Description HEEL  Final   Special Requests NONE  Final   Culture NO ANAEROBES ISOLATED  Final   Report Status 09/01/2015 FINAL  Final  Wound culture     Status: None   Collection Time: 08/28/15  5:43 PM  Result Value Ref Range Status   Specimen Description HEEL  Final   Special Requests NONE  Final   Gram Stain NO WBC SEEN NO ORGANISMS SEEN   Final   Culture   Final    MODERATE GROWTH PROTEUS MIRABILIS LIGHT GROWTH STAPHYLOCOCCUS AUREUS    Report Status 09/01/2015 FINAL  Final   Organism ID, Bacteria PROTEUS MIRABILIS  Final   Organism ID, Bacteria STAPHYLOCOCCUS AUREUS  Final      Susceptibility   Staphylococcus aureus - MIC*    CIPROFLOXACIN >=8 RESISTANT  Resistant     GENTAMICIN <=0.5 SENSITIVE Sensitive     OXACILLIN 2 SENSITIVE Sensitive     TRIMETH/SULFA <=10 SENSITIVE Sensitive     CEFOXITIN SCREEN NEGATIVE Sensitive     Inducible Clindamycin NEGATIVE Sensitive     TETRACYCLINE Value in next row Sensitive      SENSITIVE<=1    * LIGHT GROWTH STAPHYLOCOCCUS AUREUS   Proteus mirabilis - MIC*    AMPICILLIN Value in next row Sensitive      SENSITIVE<=1    CEFAZOLIN Value in next row Sensitive      SENSITIVE<=1    CEFTRIAXONE Value in next row Sensitive      SENSITIVE<=1    CIPROFLOXACIN Value in next row Sensitive      SENSITIVE<=1    GENTAMICIN Value in next row Sensitive      SENSITIVE<=1    IMIPENEM Value in next row Sensitive      SENSITIVE<=1    NITROFURANTOIN Value in next row Resistant      SENSITIVE<=1    TRIMETH/SULFA Value in next row Sensitive  SENSITIVE<=1    PIP/TAZO Value in next row Sensitive      SENSITIVE<=4    * MODERATE GROWTH PROTEUS MIRABILIS    RADIOLOGY:  No results found.  EKG:   Orders placed or performed during the hospital encounter of 08/26/15  . EKG 12-Lead  . EKG 12-Lead      Management plans discussed with the patient, family and they are in agreement.  CODE STATUS:     Code Status Orders        Start     Ordered   08/28/15 2209  Full code   Continuous     08/28/15 2209      TOTAL TIME TAKING CARE OF THIS PATIENT: 40  minutes.    Katharina Caper M.D on 09/01/2015 at 1:11 PM  Between 7am to 6pm - Pager - [phone]  After 6pm go to [email or web address] - password [email or web address] [facility]  [facility]  Office  [phone]  CC: Primary care physician; No PCP Per Patient

## 2015-09-01 NOTE — Progress Notes (Signed)
Patient prepared for discharge this afternoon. Dr. Alberteen Spindleline in today and changed dressing to left foot and the wound care nurse changed dressing to the right foot. Both dressing are clean dry and intact. Received order from Dr. Winona LegatoVaickute to discontinue central line dressing to right neck. Central line discontinued as order, site clean dry, no bleeding noted. [facility] called spoke to  Nurse [employer]rystal Jeffries and gave report. [facility]

## 2015-09-01 NOTE — Consult Note (Signed)
WOC wound consult note Reason for Consult:Right malleolus wound. Surgery is managing left heel abscess.  Antibiotic beads and dressing in place, per MD note.  Wound type:Right malleolus neuropathic ulcer, full thickness Pressure Ulcer POA: Yes Measurement: 1 cm x 0.5 cm x0.2 cm  Wound bed:100% pale pink Drainage (amount, consistency, odor) Minimal serosanguinous drainage.  Periwound:Intact Dressing procedure/placement/frequency:Cleanse wound right malleolus with NS and pat dry. Apply Allevyn silicone border foam dressing. Change every 3 days and PRN soilage.  Will not follow at this time.  Please re-consult if needed.  Maple HudsonKaren Tristyn Demarest RN BSN CWON Pager [phone]

## 2015-09-02 LAB — CULTURE, BLOOD (ROUTINE X 2): Culture: NO GROWTH

## 2015-09-22 ENCOUNTER — Other Ambulatory Visit: Payer: Self-pay | Admitting: Vascular Surgery

## 2015-10-02 ENCOUNTER — Encounter: Payer: Self-pay | Admitting: *Deleted

## 2015-10-02 ENCOUNTER — Encounter
Admission: RE | Disposition: A | Discharge status: Skilled Nursing Facility | Payer: Self-pay | Source: Ambulatory Visit | Attending: Vascular Surgery

## 2015-10-02 ENCOUNTER — Ambulatory Visit
Admission: RE | Admit: 2015-10-02 | Discharge: 2015-10-02 | Disposition: A | Discharge status: Skilled Nursing Facility | Payer: Medicare Other | Source: Ambulatory Visit | Attending: Vascular Surgery | Admitting: Vascular Surgery

## 2015-10-02 DIAGNOSIS — E669 Obesity, unspecified: Secondary | ICD-10-CM | POA: Diagnosis not present

## 2015-10-02 DIAGNOSIS — L97421 Non-pressure chronic ulcer of left heel and midfoot limited to breakdown of skin: Secondary | ICD-10-CM | POA: Insufficient documentation

## 2015-10-02 DIAGNOSIS — I70249 Atherosclerosis of native arteries of left leg with ulceration of unspecified site: Secondary | ICD-10-CM | POA: Insufficient documentation

## 2015-10-02 DIAGNOSIS — I120 Hypertensive chronic kidney disease with stage 5 chronic kidney disease or end stage renal disease: Secondary | ICD-10-CM | POA: Diagnosis not present

## 2015-10-02 DIAGNOSIS — L97309 Non-pressure chronic ulcer of unspecified ankle with unspecified severity: Secondary | ICD-10-CM | POA: Diagnosis not present

## 2015-10-02 DIAGNOSIS — L97423 Non-pressure chronic ulcer of left heel and midfoot with necrosis of muscle: Secondary | ICD-10-CM | POA: Diagnosis not present

## 2015-10-02 DIAGNOSIS — Z7902 Long term (current) use of antithrombotics/antiplatelets: Secondary | ICD-10-CM | POA: Insufficient documentation

## 2015-10-02 DIAGNOSIS — J449 Chronic obstructive pulmonary disease, unspecified: Secondary | ICD-10-CM | POA: Diagnosis not present

## 2015-10-02 DIAGNOSIS — L97929 Non-pressure chronic ulcer of unspecified part of left lower leg with unspecified severity: Secondary | ICD-10-CM | POA: Diagnosis not present

## 2015-10-02 DIAGNOSIS — R0989 Other specified symptoms and signs involving the circulatory and respiratory systems: Secondary | ICD-10-CM | POA: Insufficient documentation

## 2015-10-02 DIAGNOSIS — Z992 Dependence on renal dialysis: Secondary | ICD-10-CM | POA: Insufficient documentation

## 2015-10-02 DIAGNOSIS — Z79899 Other long term (current) drug therapy: Secondary | ICD-10-CM | POA: Diagnosis not present

## 2015-10-02 DIAGNOSIS — E785 Hyperlipidemia, unspecified: Secondary | ICD-10-CM | POA: Diagnosis not present

## 2015-10-02 DIAGNOSIS — L97311 Non-pressure chronic ulcer of right ankle limited to breakdown of skin: Secondary | ICD-10-CM | POA: Insufficient documentation

## 2015-10-02 DIAGNOSIS — N186 End stage renal disease: Secondary | ICD-10-CM | POA: Insufficient documentation

## 2015-10-02 DIAGNOSIS — E1122 Type 2 diabetes mellitus with diabetic chronic kidney disease: Secondary | ICD-10-CM | POA: Diagnosis not present

## 2015-10-02 DIAGNOSIS — Z794 Long term (current) use of insulin: Secondary | ICD-10-CM | POA: Diagnosis not present

## 2015-10-02 DIAGNOSIS — I70263 Atherosclerosis of native arteries of extremities with gangrene, bilateral legs: Secondary | ICD-10-CM | POA: Diagnosis present

## 2015-10-02 DIAGNOSIS — Z87891 Personal history of nicotine dependence: Secondary | ICD-10-CM | POA: Insufficient documentation

## 2015-10-02 DIAGNOSIS — I70239 Atherosclerosis of native arteries of right leg with ulceration of unspecified site: Secondary | ICD-10-CM | POA: Insufficient documentation

## 2015-10-02 DIAGNOSIS — Z7982 Long term (current) use of aspirin: Secondary | ICD-10-CM | POA: Diagnosis not present

## 2015-10-02 DIAGNOSIS — L97209 Non-pressure chronic ulcer of unspecified calf with unspecified severity: Secondary | ICD-10-CM | POA: Diagnosis not present

## 2015-10-02 HISTORY — PX: PERIPHERAL VASCULAR CATHETERIZATION: SHX172C

## 2015-10-02 SURGERY — ABDOMINAL AORTOGRAM W/LOWER EXTREMITY
Laterality: Right | Wound class: Clean

## 2015-10-02 MED ORDER — LIDOCAINE-EPINEPHRINE (PF) 1 %-1:200000 IJ SOLN
INTRAMUSCULAR | Status: DC | PRN
  Administered 2015-10-02: 10 mL via INTRADERMAL

## 2015-10-02 MED ORDER — HYDROMORPHONE HCL 1 MG/ML IJ SOLN
INTRAMUSCULAR | Status: AC
  Filled 2015-10-02: qty 1

## 2015-10-02 MED ORDER — FAMOTIDINE 20 MG PO TABS: 40.0000 mg | ORAL_TABLET | ORAL | Status: DC | PRN

## 2015-10-02 MED ORDER — METHYLPREDNISOLONE SODIUM SUCC 125 MG IJ SOLR: 125.0000 mg | INTRAMUSCULAR | Status: DC | PRN

## 2015-10-02 MED ORDER — HEPARIN SODIUM (PORCINE) 1000 UNIT/ML IJ SOLN
INTRAMUSCULAR | Status: DC | PRN
  Administered 2015-10-02: 5000 [IU] via INTRAVENOUS

## 2015-10-02 MED ORDER — IOHEXOL 300 MG/ML  SOLN
INTRAMUSCULAR | Status: DC | PRN
  Administered 2015-10-02: 85 mL via INTRA_ARTERIAL

## 2015-10-02 MED ORDER — HEPARIN SODIUM (PORCINE) 1000 UNIT/ML IJ SOLN
INTRAMUSCULAR | Status: AC
  Filled 2015-10-02: qty 1

## 2015-10-02 MED ORDER — MIDAZOLAM HCL 5 MG/5ML IJ SOLN
INTRAMUSCULAR | Status: AC
  Filled 2015-10-02: qty 5

## 2015-10-02 MED ORDER — HYDROMORPHONE HCL 1 MG/ML IJ SOLN
1.0000 mg | Freq: Once | INTRAMUSCULAR | Status: AC
  Administered 2015-10-02: 1 mg via INTRAVENOUS

## 2015-10-02 MED ORDER — HEPARIN (PORCINE) IN NACL 2-0.9 UNIT/ML-% IJ SOLN
INTRAMUSCULAR | Status: AC
  Filled 2015-10-02: qty 1000

## 2015-10-02 MED ORDER — FENTANYL CITRATE (PF) 100 MCG/2ML IJ SOLN
INTRAMUSCULAR | Status: DC | PRN
  Administered 2015-10-02 (×2): 50 ug via INTRAVENOUS

## 2015-10-02 MED ORDER — DEXTROSE 5 % IV SOLN
INTRAVENOUS | Status: AC
  Filled 2015-10-02 (×17): qty 1.5

## 2015-10-02 MED ORDER — SODIUM CHLORIDE 0.9 % IV SOLN
INTRAVENOUS | Status: DC
  Administered 2015-10-02 (×2): via INTRAVENOUS

## 2015-10-02 MED ORDER — FENTANYL CITRATE (PF) 100 MCG/2ML IJ SOLN
INTRAMUSCULAR | Status: AC
  Filled 2015-10-02: qty 2

## 2015-10-02 MED ORDER — ONDANSETRON HCL 4 MG/2ML IJ SOLN: 4.0000 mg | Freq: Four times a day (QID) | INTRAMUSCULAR | Status: DC | PRN

## 2015-10-02 MED ORDER — DEXTROSE 5 % IV SOLN
1.5000 g | INTRAVENOUS | Status: AC
  Administered 2015-10-02: 1.5 g via INTRAVENOUS

## 2015-10-02 MED ORDER — MIDAZOLAM HCL 2 MG/2ML IJ SOLN
INTRAMUSCULAR | Status: DC | PRN
  Administered 2015-10-02: 2 mg via INTRAVENOUS
  Administered 2015-10-02: 1 mg via INTRAVENOUS

## 2015-10-02 MED ORDER — LIDOCAINE-EPINEPHRINE (PF) 1 %-1:200000 IJ SOLN
INTRAMUSCULAR | Status: AC
  Filled 2015-10-02: qty 30

## 2015-10-02 MED ORDER — LOSARTAN POTASSIUM 25 MG PO TABS: 12.5000 mg | ORAL_TABLET | Freq: Every day | ORAL | Status: AC

## 2015-10-02 SURGICAL SUPPLY — 20 items
BALLN LUTONIX 5X150X130 (BALLOONS) ×5
BALLN LUTONIX 6X150X130 (BALLOONS) ×5
BALLN ULTRVRSE 2.5X300X150 (BALLOONS) ×5
BALLOON LUTONIX 5X150X130 (BALLOONS) ×3 IMPLANT
BALLOON LUTONIX 6X150X130 (BALLOONS) ×3 IMPLANT
BALLOON ULTRVRSE 2.5X300X150 (BALLOONS) ×3 IMPLANT
CATH CXI SUPP ANG 4FR 135 (MICROCATHETER) ×3 IMPLANT
CATH CXI SUPP ANG 4FR 135CM (MICROCATHETER) ×5
CATH PIG 70CM (CATHETERS) ×5 IMPLANT
CATH VERT 100CM (CATHETERS) ×5 IMPLANT
DEVICE PRESTO INFLATION (MISCELLANEOUS) ×5 IMPLANT
DEVICE STARCLOSE SE CLOSURE (Vascular Products) ×5 IMPLANT
GLIDEWIRE ADV .035X260CM (WIRE) ×5 IMPLANT
PACK ANGIOGRAPHY (CUSTOM PROCEDURE TRAY) ×5 IMPLANT
SHEATH ANL2 6FRX45 HC (SHEATH) ×5 IMPLANT
SHEATH BRITE TIP 5FRX11 (SHEATH) ×5 IMPLANT
SYR MEDRAD MARK V 150ML (SYRINGE) ×5 IMPLANT
TUBING CONTRAST HIGH PRESS 72 (TUBING) ×5 IMPLANT
WIRE G V18X300CM (WIRE) ×5 IMPLANT
WIRE J 3MM .035X145CM (WIRE) ×5 IMPLANT

## 2015-10-02 NOTE — Op Note (Signed)
[facility] Percutaneous Study/Intervention Procedural Note   Date of Surgery: 10/02/2015  Surgeon(s):Gavina Dildine   Assistants:none  Pre-operative Diagnosis: PAD with ulceration bilateral lower extremities  Post-operative diagnosis: Same  Procedure(s) Performed: 1. Ultrasound guidance for vascular access left femoral artery 2. Catheter placement into right anterior tibial artery from left femoral approach 3. Aortogram and selective right lower extremity angiogram 4. Percutaneous transluminal angioplasty of entire right anterior tibial artery down to the foot with 2.5 mm diameter by 30 cm length angioplasty balloon 2 5. Percutaneous transluminal angioplasty of right mid to distal superficial femoral artery with 5 mm diameter by 15 cm length Lutonix angioplasty balloon and proximal superficial femoral artery with 6 mm diameter by 15 cm length Lutonix angioplasty balloon  6.  StarClose closure device left femoral artery  EBL: 25 cc  Contrast: 85 cc  Fluro Time: 5.6 minutes  Moderate Conscious Sedation Time: approximately 40 minutes using 3 mg of Versed and 100 mcg of Fentanyl  Indications: Patient is a 81 y.o.male with ulceration of both lower extremities and severe peripheral arterial disease requiring multiple previous interventions. The patient is brought in for angiography for further evaluation and potential treatment. Risks and benefits are discussed and informed consent is obtained  Procedure: The patient was identified and appropriate procedural time out was performed. The patient was then placed supine on the table and prepped and draped in the usual sterile fashion.Moderate conscious sedation was administered throughout the procedure with my supervision of the RN administering medicines and monitoring the patient's vital signs, pulse oximetry, telemetry and mental status  throughout from the start of the procedure until the patient was taken to the recovery room. Ultrasound was used to evaluate the left common femoral artery. It was patent . A digital ultrasound image was acquired. A Seldinger needle was used to access the left common femoral artery under direct ultrasound guidance and a permanent image was performed. A 0.035 J wire was advanced without resistance and a 5Fr sheath was placed. Pigtail catheter was placed into the aorta and an AP aortogram was performed. This demonstrated typical sluggish flow in the renal arteries as would be expected on a dialysis patient and normal aorta and iliac segments without significant stenosis. The left common iliac artery stent appeared widely patent and the right common iliac artery had a mild stenosis of about 30%. I then crossed the aortic bifurcation and advanced to the right femoral head. Selective right lower extremity angiogram was then performed. This demonstrated moderate stenosis in the proximal superficial femoral artery just above and at the leading edge of the SFA stent of about 60%, moderate to more severe stenosis of the mid to distal SFA at the bottom of the previous stent in the 70-75% range. The anterior tibial artery was chronically occluded but did reconstitute at the ankle and the peroneal and posterior tibial arteries were patent with good flow distally. The patient was systemically heparinized and a 6 [place] Ansell sheath was then placed over the [employer] wire. I then used a Kumpe catheter and the advantage wire to navigate through the SFA stenoses and confirm intraluminal flow in the popliteal artery. Given his multiple risk factors for nonhealing including diabetes and end-stage renal disease, I elected to treat his tibial disease as well as his SFA disease to try to give him the best chance for wound healing. I exchanged for a 0.018 wire and a CXI catheter and was able to cross the chronic total  occlusion of  the anterior tibial artery and confirm intraluminal flow in the dorsalis pedis artery in either then elected to proceed with treatment and replaced a 0.018 wire. A 2.5 mm diameter by 30 cm length angioplasty balloon was inflated from the dorsalis pedis artery in the foot throughout the entire anterior tibial artery up to its origin. 2 inflations of 10-12 atm were required. Flow was seen distally although the posterior tibial artery remained the dominant runoff to the foot. I then turned my attention to the SFA lesions. The mid to distal lesion was treated with a 5 mm diameter by 15 cm length Lutonix drug-coated angioplasty balloon inflated to 14 atm for 1 minute. The proximal SFA was treated with a 6 mm diameter by 15 cm length Lutonix drug-coated angioplasty balloon inflated to 10 atm for 1 minute. After angioplasty, the proximal SFA lesion had less than 20% residual stenosis in the mid to distal SFA lesion had about a 25-30% residual stenosis which was not flow limiting. I elected to terminate the procedure. The sheath was removed and StarClose closure device was deployed in the left femoral artery with excellent hemostatic result. The patient was taken to the recovery room in stable condition having tolerated the procedure well.  Findings:  Aortogram: typical sluggish flow in the renal arteries as would be expected on a dialysis patient and normal aorta and iliac segments without significant stenosis. The left common iliac artery stent appeared widely patent and the right common iliac artery had a mild stenosis of about 30%. Right Lower Extremity: moderate stenosis in the proximal superficial femoral artery just above and at the leading edge of the SFA stent of about 60%, moderate to more severe stenosis of the mid to distal SFA at the bottom of the previous stent in the 70-75% range. The anterior tibial artery was chronically occluded but did reconstitute at the ankle  and the peroneal and posterior tibial arteries were patent with good flow distally.   Disposition: Patient was taken to the recovery room in stable condition having tolerated the procedure well.  Complications: None  Keltie Labell 10/02/2015 9:52 AM

## 2015-10-02 NOTE — H&P (Signed)
  [facility] History & Physical Update  The patient was interviewed and re-examined.  The patient's previous History and Physical has been reviewed and is unchanged.  There is no change in the plan of care. We plan to proceed with the scheduled procedure.  DEW,JASON, MD  10/02/2015, 8:09 AM

## 2015-10-02 NOTE — Discharge Instructions (Signed)
Angiogram, Care After °Refer to this sheet in the next few weeks. These instructions provide you with information about caring for yourself after your procedure. Your health care provider may also give you more specific instructions. Your treatment has been planned according to current medical practices, but problems sometimes occur. Call your health care provider if you have any problems or questions after your procedure. °WHAT TO EXPECT AFTER THE PROCEDURE °After your procedure, it is typical to have the following: °· Bruising at the catheter insertion site that usually fades within 1-2 weeks. °· Blood collecting in the tissue (hematoma) that may be painful to the touch. It should usually decrease in size and tenderness within 1-2 weeks. °HOME CARE INSTRUCTIONS °· Take medicines only as directed by your health care provider. °· You may shower 24-48 hours after the procedure or as directed by your health care provider. Remove the bandage (dressing) and gently wash the site with plain soap and water. Pat the area dry with a clean towel. Do not rub the site, because this may cause bleeding. °· Do not take baths, swim, or use a hot tub until your health care provider approves. °· Check your insertion site every day for redness, swelling, or drainage. °· Do not apply powder or lotion to the site. °· Do not lift over 10 lb (4.5 kg) for 5 days after your procedure or as directed by your health care provider. °· Ask your health care provider when it is okay to: °¨ Return to work or school. °¨ Resume usual physical activities or sports. °¨ Resume sexual activity. °· Do not drive home if you are discharged the same day as the procedure. Have someone else drive you. °· You may drive 24 hours after the procedure unless otherwise instructed by your health care provider. °· Do not operate machinery or power tools for 24 hours after the procedure or as directed by your health care provider. °· If your procedure was done as an  outpatient procedure, which means that you went home the same day as your procedure, a responsible adult should be with you for the first 24 hours after you arrive home. °· Keep all follow-up visits as directed by your health care provider. This is important. °SEEK MEDICAL CARE IF: °· You have a fever. °· You have chills. °· You have increased bleeding from the catheter insertion site. Hold pressure on the site. °SEEK IMMEDIATE MEDICAL CARE IF: °· You have unusual pain at the catheter insertion site. °· You have redness, warmth, or swelling at the catheter insertion site. °· You have drainage (other than a small amount of blood on the dressing) from the catheter insertion site. °· The catheter insertion site is bleeding, and the bleeding does not stop after 30 minutes of holding steady pressure on the site. °· The area near or just beyond the catheter insertion site becomes pale, cool, tingly, or numb. °  °This information is not intended to replace advice given to you by your health care provider. Make sure you discuss any questions you have with your health care provider. °  °Document Released: 03/04/2005 Document Revised: 09/06/2014 Document Reviewed: 01/17/2013 °Elsevier Interactive Patient Education ©2016 [employer] ° °

## 2015-10-03 ENCOUNTER — Encounter: Payer: Self-pay | Admitting: Vascular Surgery

## 2015-10-22 ENCOUNTER — Other Ambulatory Visit: Payer: Self-pay | Admitting: Vascular Surgery

## 2015-10-23 ENCOUNTER — Ambulatory Visit
Admission: RE | Admit: 2015-10-23 | Discharge: 2015-10-23 | Disposition: A | Discharge status: Skilled Nursing Facility | Payer: Medicare Other | Source: Ambulatory Visit | Attending: Vascular Surgery | Admitting: Vascular Surgery

## 2015-10-23 ENCOUNTER — Encounter
Admission: RE | Disposition: A | Discharge status: Skilled Nursing Facility | Payer: Self-pay | Source: Ambulatory Visit | Attending: Vascular Surgery

## 2015-10-23 DIAGNOSIS — Z538 Procedure and treatment not carried out for other reasons: Secondary | ICD-10-CM | POA: Insufficient documentation

## 2015-10-23 DIAGNOSIS — N186 End stage renal disease: Secondary | ICD-10-CM | POA: Insufficient documentation

## 2015-10-23 DIAGNOSIS — E1122 Type 2 diabetes mellitus with diabetic chronic kidney disease: Secondary | ICD-10-CM | POA: Diagnosis present

## 2015-10-23 DIAGNOSIS — Z91013 Allergy to seafood: Secondary | ICD-10-CM | POA: Diagnosis not present

## 2015-10-23 SURGERY — A/V SHUNTOGRAM/FISTULAGRAM
Anesthesia: Moderate Sedation | Laterality: Left

## 2015-10-23 MED ORDER — FAMOTIDINE 20 MG PO TABS
ORAL_TABLET | ORAL | Status: AC
  Filled 2015-10-23: qty 1

## 2015-10-23 MED ORDER — METHYLPREDNISOLONE SODIUM SUCC 125 MG IJ SOLR: 125.0000 mg | INTRAMUSCULAR | Status: DC | PRN

## 2015-10-23 MED ORDER — FAMOTIDINE 20 MG PO TABS: 40.0000 mg | ORAL_TABLET | ORAL | Status: DC | PRN

## 2015-10-23 MED ORDER — ONDANSETRON HCL 4 MG/2ML IJ SOLN: 4.0000 mg | Freq: Four times a day (QID) | INTRAMUSCULAR | Status: DC | PRN

## 2015-10-23 MED ORDER — HYDROMORPHONE HCL 1 MG/ML IJ SOLN: 1.0000 mg | Freq: Once | INTRAMUSCULAR | Status: DC

## 2015-10-23 MED ORDER — METHYLPREDNISOLONE SODIUM SUCC 125 MG IJ SOLR
INTRAMUSCULAR | Status: AC
  Filled 2015-10-23: qty 2

## 2015-10-23 MED ORDER — DEXTROSE 5 % IV SOLN: 1.5000 g | INTRAVENOUS | Status: DC

## 2015-10-23 MED ORDER — SODIUM CHLORIDE 0.9 % IV SOLN: INTRAVENOUS | Status: DC

## 2015-10-23 NOTE — Progress Notes (Signed)
Informed Dr Wyn Quaker about need for attestation of consent, unable to get IV access, needs pre-med for iodine allergy and potassium lab draw, also waiting on med list from [employer] [employer]. Patient stated that he ate regular breakfast this morning because he was unaware that he was coming here for procedure. MD acknowledged and will come to talk with patient and wife.

## 2015-10-23 NOTE — H&P (Signed)
   [facility] History & Physical Update  The patient was interviewed and re-examined.  The patient's previous History and Physical has been reviewed and is unchanged.  There is no change in the plan of care. We plan to proceed with the scheduled procedure.  Justine Dines, MD  10/23/2015, 8:06 AM

## 2015-10-24 ENCOUNTER — Encounter
Admission: RE | Disposition: A | Discharge status: Skilled Nursing Facility | Payer: Self-pay | Source: Ambulatory Visit | Attending: Vascular Surgery

## 2015-10-24 ENCOUNTER — Encounter: Payer: Self-pay | Admitting: *Deleted

## 2015-10-24 ENCOUNTER — Ambulatory Visit
Admission: RE | Admit: 2015-10-24 | Discharge: 2015-10-24 | Disposition: A | Discharge status: Skilled Nursing Facility | Payer: Medicare Other | Source: Ambulatory Visit | Attending: Vascular Surgery | Admitting: Vascular Surgery

## 2015-10-24 DIAGNOSIS — I70219 Atherosclerosis of native arteries of extremities with intermittent claudication, unspecified extremity: Secondary | ICD-10-CM | POA: Diagnosis not present

## 2015-10-24 DIAGNOSIS — N186 End stage renal disease: Secondary | ICD-10-CM | POA: Insufficient documentation

## 2015-10-24 DIAGNOSIS — Z7982 Long term (current) use of aspirin: Secondary | ICD-10-CM | POA: Insufficient documentation

## 2015-10-24 DIAGNOSIS — Z992 Dependence on renal dialysis: Secondary | ICD-10-CM | POA: Diagnosis not present

## 2015-10-24 DIAGNOSIS — Z7902 Long term (current) use of antithrombotics/antiplatelets: Secondary | ICD-10-CM | POA: Insufficient documentation

## 2015-10-24 DIAGNOSIS — T82868A Thrombosis of vascular prosthetic devices, implants and grafts, initial encounter: Secondary | ICD-10-CM | POA: Diagnosis not present

## 2015-10-24 DIAGNOSIS — R0989 Other specified symptoms and signs involving the circulatory and respiratory systems: Secondary | ICD-10-CM | POA: Diagnosis not present

## 2015-10-24 DIAGNOSIS — E669 Obesity, unspecified: Secondary | ICD-10-CM | POA: Diagnosis not present

## 2015-10-24 DIAGNOSIS — L97209 Non-pressure chronic ulcer of unspecified calf with unspecified severity: Secondary | ICD-10-CM | POA: Insufficient documentation

## 2015-10-24 DIAGNOSIS — E1122 Type 2 diabetes mellitus with diabetic chronic kidney disease: Secondary | ICD-10-CM | POA: Insufficient documentation

## 2015-10-24 DIAGNOSIS — L97421 Non-pressure chronic ulcer of left heel and midfoot limited to breakdown of skin: Secondary | ICD-10-CM | POA: Diagnosis not present

## 2015-10-24 DIAGNOSIS — Y832 Surgical operation with anastomosis, bypass or graft as the cause of abnormal reaction of the patient, or of later complication, without mention of misadventure at the time of the procedure: Secondary | ICD-10-CM | POA: Insufficient documentation

## 2015-10-24 DIAGNOSIS — Z87891 Personal history of nicotine dependence: Secondary | ICD-10-CM | POA: Diagnosis not present

## 2015-10-24 DIAGNOSIS — E785 Hyperlipidemia, unspecified: Secondary | ICD-10-CM | POA: Diagnosis not present

## 2015-10-24 HISTORY — PX: PERIPHERAL VASCULAR CATHETERIZATION: SHX172C

## 2015-10-24 LAB — POTASSIUM (ARMC VASCULAR LAB ONLY): POTASSIUM (ARMC VASCULAR LAB): 3.7 (ref 3.5–5.1)

## 2015-10-24 SURGERY — A/V SHUNTOGRAM/FISTULAGRAM
Anesthesia: Moderate Sedation

## 2015-10-24 MED ORDER — MIDAZOLAM HCL 2 MG/2ML IJ SOLN
INTRAMUSCULAR | Status: AC
  Filled 2015-10-24: qty 2

## 2015-10-24 MED ORDER — GUAIFENESIN-DM 100-10 MG/5ML PO SYRP: 15.0000 mL | ORAL_SOLUTION | ORAL | Status: DC | PRN

## 2015-10-24 MED ORDER — OXYCODONE-ACETAMINOPHEN 5-325 MG PO TABS: 1.0000 | ORAL_TABLET | ORAL | Status: DC | PRN

## 2015-10-24 MED ORDER — ALTEPLASE 2 MG IJ SOLR
INTRAMUSCULAR | Status: DC | PRN
  Administered 2015-10-24: 2 mg

## 2015-10-24 MED ORDER — HEPARIN SODIUM (PORCINE) 1000 UNIT/ML IJ SOLN
INTRAMUSCULAR | Status: AC
  Filled 2015-10-24: qty 1

## 2015-10-24 MED ORDER — ONDANSETRON HCL 4 MG/2ML IJ SOLN: 4.0000 mg | Freq: Four times a day (QID) | INTRAMUSCULAR | Status: DC | PRN

## 2015-10-24 MED ORDER — ACETAMINOPHEN 325 MG PO TABS: 325.0000 mg | ORAL_TABLET | ORAL | Status: DC | PRN

## 2015-10-24 MED ORDER — PHENOL 1.4 % MT LIQD: 1.0000 | OROMUCOSAL | Status: DC | PRN

## 2015-10-24 MED ORDER — FAMOTIDINE 20 MG PO TABS
ORAL_TABLET | ORAL | Status: AC
  Administered 2015-10-24: 20 mg via ORAL
  Filled 2015-10-24: qty 1

## 2015-10-24 MED ORDER — METHYLPREDNISOLONE SODIUM SUCC 125 MG IJ SOLR
125.0000 mg | Freq: Once | INTRAMUSCULAR | Status: AC
  Administered 2015-10-24: 125 mg via INTRAVENOUS

## 2015-10-24 MED ORDER — ALTEPLASE 2 MG IJ SOLR
INTRAMUSCULAR | Status: AC
  Filled 2015-10-24: qty 4

## 2015-10-24 MED ORDER — IOHEXOL 300 MG/ML  SOLN
INTRAMUSCULAR | Status: DC | PRN
  Administered 2015-10-24: 120 mL via INTRAVENOUS

## 2015-10-24 MED ORDER — MIDAZOLAM HCL 2 MG/2ML IJ SOLN
INTRAMUSCULAR | Status: DC | PRN
  Administered 2015-10-24: 2 mg via INTRAVENOUS

## 2015-10-24 MED ORDER — METOPROLOL TARTRATE 1 MG/ML IV SOLN: 2.0000 mg | INTRAVENOUS | Status: DC | PRN

## 2015-10-24 MED ORDER — METHYLPREDNISOLONE SODIUM SUCC 125 MG IJ SOLR
INTRAMUSCULAR | Status: AC
  Administered 2015-10-24: 125 mg via INTRAVENOUS
  Filled 2015-10-24: qty 2

## 2015-10-24 MED ORDER — LIDOCAINE-EPINEPHRINE (PF) 1 %-1:200000 IJ SOLN
INTRAMUSCULAR | Status: AC
  Filled 2015-10-24: qty 30

## 2015-10-24 MED ORDER — FENTANYL CITRATE (PF) 100 MCG/2ML IJ SOLN
INTRAMUSCULAR | Status: DC | PRN
  Administered 2015-10-24: 50 ug via INTRAVENOUS

## 2015-10-24 MED ORDER — LABETALOL HCL 5 MG/ML IV SOLN: 10.0000 mg | INTRAVENOUS | Status: DC | PRN

## 2015-10-24 MED ORDER — SODIUM CHLORIDE 0.9 % IV SOLN: 500.0000 mL | Freq: Once | INTRAVENOUS | Status: DC | PRN

## 2015-10-24 MED ORDER — FENTANYL CITRATE (PF) 100 MCG/2ML IJ SOLN
INTRAMUSCULAR | Status: AC
  Filled 2015-10-24: qty 2

## 2015-10-24 MED ORDER — FAMOTIDINE 20 MG PO TABS
20.0000 mg | ORAL_TABLET | Freq: Once | ORAL | Status: AC
  Administered 2015-10-24: 20 mg via ORAL

## 2015-10-24 MED ORDER — ALUM & MAG HYDROXIDE-SIMETH 200-200-20 MG/5ML PO SUSP: 15.0000 mL | ORAL | Status: DC | PRN

## 2015-10-24 MED ORDER — HYDRALAZINE HCL 20 MG/ML IJ SOLN: 5.0000 mg | INTRAMUSCULAR | Status: DC | PRN

## 2015-10-24 MED ORDER — MORPHINE SULFATE (PF) 4 MG/ML IV SOLN: 2.0000 mg | INTRAVENOUS | Status: DC | PRN

## 2015-10-24 MED ORDER — HEPARIN SODIUM (PORCINE) 1000 UNIT/ML IJ SOLN
INTRAMUSCULAR | Status: DC | PRN
  Administered 2015-10-24: 4000 [IU] via INTRAVENOUS
  Administered 2015-10-24: 2000 [IU] via INTRAVENOUS

## 2015-10-24 MED ORDER — ACETAMINOPHEN 325 MG RE SUPP: 325.0000 mg | RECTAL | Status: DC | PRN

## 2015-10-24 MED ORDER — HEPARIN (PORCINE) IN NACL 2-0.9 UNIT/ML-% IJ SOLN
INTRAMUSCULAR | Status: AC
  Filled 2015-10-24: qty 1500

## 2015-10-24 MED ORDER — SODIUM CHLORIDE 0.9 % IV SOLN
INTRAVENOUS | Status: DC
  Administered 2015-10-24: 14:00:00 via INTRAVENOUS

## 2015-10-24 SURGICAL SUPPLY — 26 items
BALLN DORADO 6X80X80 (BALLOONS) ×4
BALLN DORADO 7X120X80 (BALLOONS) ×4 IMPLANT
BALLN DORADO 8X100X80 (BALLOONS) ×4
BALLN DORADO 9X80X80 (BALLOONS) ×4
BALLOON DORADO 6X80X80 (BALLOONS) ×2 IMPLANT
BALLOON DORADO 8X100X80 (BALLOONS) ×2 IMPLANT
BALLOON DORADO 9X80X80 (BALLOONS) ×2 IMPLANT
CANNULA 5F STIFF (CANNULA) ×8 IMPLANT
CATH EMBOLECTOMY 5FR (BALLOONS) ×4 IMPLANT
CATH TORCON 5FR 0.38 (CATHETERS) ×4 IMPLANT
DEVICE PRESTO INFLATION (MISCELLANEOUS) ×4 IMPLANT
DRAPE BRACHIAL (DRAPES) ×4 IMPLANT
GLIDEWIRE STIFF .35X180X3 HYDR (WIRE) ×4 IMPLANT
PACK ANGIOGRAPHY (CUSTOM PROCEDURE TRAY) ×4 IMPLANT
SET AVX THROMB ULT (MISCELLANEOUS) ×4 IMPLANT
SHEATH BRITE TIP 6FRX5.5 (SHEATH) ×12 IMPLANT
SHEATH BRITE TIP 7FRX5.5 (SHEATH) ×8 IMPLANT
STENT VIABAHN 8X100X120 (Permanent Stent) ×2 IMPLANT
STENT VIABAHN 8X10X120 (Permanent Stent) ×2 IMPLANT
STENT VIABAHN 8X150X120 (Permanent Stent) ×2 IMPLANT
STENT VIABAHN 8X15X120 7FR (Permanent Stent) ×2 IMPLANT
STENT VIABAHN 8X250X120 (Permanent Stent) ×4 IMPLANT
SUT MNCRL AB 4-0 PS2 18 (SUTURE) ×8 IMPLANT
TOWEL OR 17X26 4PK STRL BLUE (TOWEL DISPOSABLE) ×8 IMPLANT
WIRE G 018X200 V18 (WIRE) ×4 IMPLANT
WIRE MAGIC TOR.035 180C (WIRE) ×8 IMPLANT

## 2015-10-24 NOTE — H&P (Signed)
  [facility] History & Physical Update  The patient was interviewed and re-examined.  The patient's previous History and Physical has been reviewed.  There does not appear to be flow in the AVG.  Will attempt procedure to salvage graft.  There is no change in the plan of care. We plan to proceed with the scheduled procedure.  DEW,JASON, MD  10/24/2015, 3:27 PM

## 2015-10-24 NOTE — Discharge Instructions (Signed)

## 2015-10-24 NOTE — Op Note (Signed)
[facility]    OPERATIVE NOTE   PROCEDURE: 1.  Left arteriovenous graft cannulation under ultrasound guidance in both a retrograde and then antegrade fashion crossing 2.  Left arm shuntogram and central venogram and left upper extremity angiogram for intervention 3.  Catheter directed thrombolysis with 4 mg of TPA delivered with the AngioJet AVX catheter to the entire graft and the radial artery for thrombosis proximal to the graft 4.  Mechanical rheolytic thrombectomy to the entire graft, axillary vein, and radial artery proximal to the graft with the AngioJet AVX catheter 5.  Fogarty embolectomy for residual arterial plug and thrombosis of the radial artery well proximal to the graft 6.  Percutaneous transluminal angioplasty of arterial anastomosis and radial artery proximal to the graft with 6 mm diameter high pressure balloon 7.  Percutaneous transluminal angioplasty of the graft and axillary vein with 7 mm diameter high pressure angioplasty balloon 8.  Viabahn covered stent placement throughout the entire graft and axillary vein for residual stenosis and thrombosis after angioplasty using an 8 mm diameter by 15 cm length stent an 8 mm diameter by 25 cm length stent 9.  Viabahn covered stent placement to the radial artery and arterial anastomosis for residual stenosis and thrombosis after angioplasty, thrombectomy, and thrombolysis  PRE-OPERATIVE DIAGNOSIS: 1. ESRD 2.  Thrombosed left brachial artery to axillary vein arteriovenous graft   POST-OPERATIVE DIAGNOSIS: same as above   SURGEON: Festus Barren, MD  ANESTHESIA: local with Moderate Conscious Sedation for approximately 90 minutes using 2 mg of Versed and 50 mcg of Fentanyl  ESTIMATED BLOOD LOSS: 100 cc  FINDING(S): 1. Stent fracture with mangled stents in the graft 2.  Thrombosed left brachial artery to axillary vein arteriovenous graft 3.  High brachial bifurcation with the graft off the radial artery  and thrombosis of the radial artery well proximal to the graft  SPECIMEN(S):  None  CONTRAST: 120 cc  FLUORO TIME:  15.4 minutes  INDICATIONS: Patient is a 81 y.o.male who presents with a thrombosed left brachial artery to axillary vein arteriovenous graft.  The patient is scheduled for an attempted declot and shuntogram.  The patient is aware the risks include but are not limited to: bleeding, infection, thrombosis of the cannulated access, and possible anaphylactic reaction to the contrast.  The patient is aware of the risks of the procedure and elects to proceed forward.  DESCRIPTION: After full informed written consent was obtained, the patient was brought back to the angiography suite and placed supine upon the angiography table.  The patient was connected to monitoring equipment. Moderate conscious sedation was administered with a face to face encounter with the patient throughout the procedure with my supervision of the RN administering medicines and monitoring the patient's vital signs, pulse oximetry, telemetry and mental status throughout from the start of the procedure until the patient was taken to the recovery room. The left arm was prepped and draped in the standard fashion for a percutaneous access intervention.  Under ultrasound guidance, the left brachial artery to axillary vein arteriovenous graft was cannulated with a micropuncture needle under direct ultrasound guidance due to the pulseless nature of the graft in both an antegrade and a retrograde fashion crossing, and permanent images were performed.  The microwire was advanced and the needle was exchanged for the a microsheath.  I then upsized to a 6 Fr Sheath and imaging was performed.  Hand injections were completed to image the access including the central venous system. This  demonstrated no flow within the AV graft with mangled stents within the graft as well as a high brachial bifurcation with thrombosis of the radial artery  which was the base of the graft for several centimeters proximal to the graft.  Based on the images, this patient will need extensive treatment to salvage the graft. I then gave the patient 4000 units of heparin and later an additional 2000 units of intravenous heparin.  I then placed a Magic torque wire into the brachial artery from the retrograde sheath and into the axillary vein from the antegrade sheath. 4 mg of TPA were deployed throughout the entirety of the graft and into the brachial vein. TPA was also deployed into the radial artery throughout its course up to its bifurcation from the brachial artery which was significantly proximal to the graft itself. This was allowed to dwell for 10-15 minutes. Mechanical rheolytic thrombectomy was then performed throughout the graft and into the axillary vein. This was also performed and the radial artery for the thrombosis proximal to the graft This uncovered a near occlusive stenosis at the mangled stent at the arterial access site, significant residual thrombus and stenosis throughout the graft.  A residual arterial plug was also seen at the arterial anastomosis. An attempt to clear the arterial plug and the radial artery thrombosis was done with 4 passes of the Fogarty embolectomy balloon. Flow-limiting arterial plug remained as did stenosis within the radial artery proximal to the graft, and I elected to treat this lesion with a 6 mm diameter high pressure angioplasty balloon. This did not result in resolution of the arterial plug, and clearance of the arterial side of the graft. After performing procedures through the antegrade sheath, a new retrograde stick was required to treat this area with a covered stent. Eventually, an 8 mm diameter by 10 cm length Viabahn stent covered stent went well into the radial artery and across the arterial anastomosis into the graft. The first retrograde sheath was removed so that I could perform extensive angioplasty of the  graft. I then turned my attention to the thrombus in the distal graft and the axillary vein. Mechanical rheolytic thrombectomy was again performed. This resulted in no improvement I upsized to an 8 [place] sheath and a 0.018 wire.  I then elected to treat this with covered stents. 2 Viabahn covered stents were used to treat the graft encompassing the arterial access site with the mangled stents, the mid graft, and then the axillary vein. The first was 8 mm in diameter by 15 cm in length and the second was 8 mm in diameter by 25 cm in length. These were postdilated with 8 mm or 9 mm balloons.  After this, an additional retrograde sheath was done under ultrasound guidance we upsized to a 7 [place] sheath there. The radial artery and arterial anastomosis back to the previous stent placed from the antegrade sheath were treated with an 8 mm diameter by 10 cm length Viabahn stent and this was deployed after removal of the antegrade sheath. This was postdilated with a 7 and 8 mm balloon At this point, the graft had brisk flow that was palpable and brisk flow on angiography Based on the completion imaging, no further intervention is necessary.  The wire and balloon were removed from the sheath.  A 4-0 Monocryl purse-string suture was sewn around the sheath.  The sheath was removed while tying down the suture.  A sterile bandage was applied to the puncture site.  COMPLICATIONS:  None  CONDITION: Stable   Elvis Boot 10/24/2015 5:14 PM

## 2015-10-28 ENCOUNTER — Encounter: Payer: Self-pay | Admitting: Vascular Surgery

## 2015-11-03 ENCOUNTER — Ambulatory Visit: Admission: RE | Admit: 2015-11-03 | Payer: Medicare Other | Source: Ambulatory Visit | Admitting: Vascular Surgery

## 2015-11-03 ENCOUNTER — Encounter: Admission: RE | Payer: Self-pay | Source: Ambulatory Visit

## 2015-11-03 SURGERY — A/V SHUNTOGRAM/FISTULAGRAM
Anesthesia: Moderate Sedation | Laterality: Left

## 2015-11-06 ENCOUNTER — Other Ambulatory Visit: Payer: Medicare Other

## 2015-11-13 ENCOUNTER — Encounter: Admission: RE | Payer: Self-pay | Source: Ambulatory Visit

## 2015-11-13 ENCOUNTER — Ambulatory Visit: Admission: RE | Admit: 2015-11-13 | Payer: Medicare Other | Source: Ambulatory Visit | Admitting: Vascular Surgery

## 2015-11-13 SURGERY — AMPUTATION BELOW KNEE
Anesthesia: General | Laterality: Left

## 2015-11-29 DEATH — deceased

## 2017-02-19 IMAGING — CR DG CHEST 2V
2 series · 2 of 2 positions shown · non-contrast
Comparison: 06/25/2014

CLINICAL DATA: Fever

EXAM:
CHEST - 2 VIEW

[chest lat]
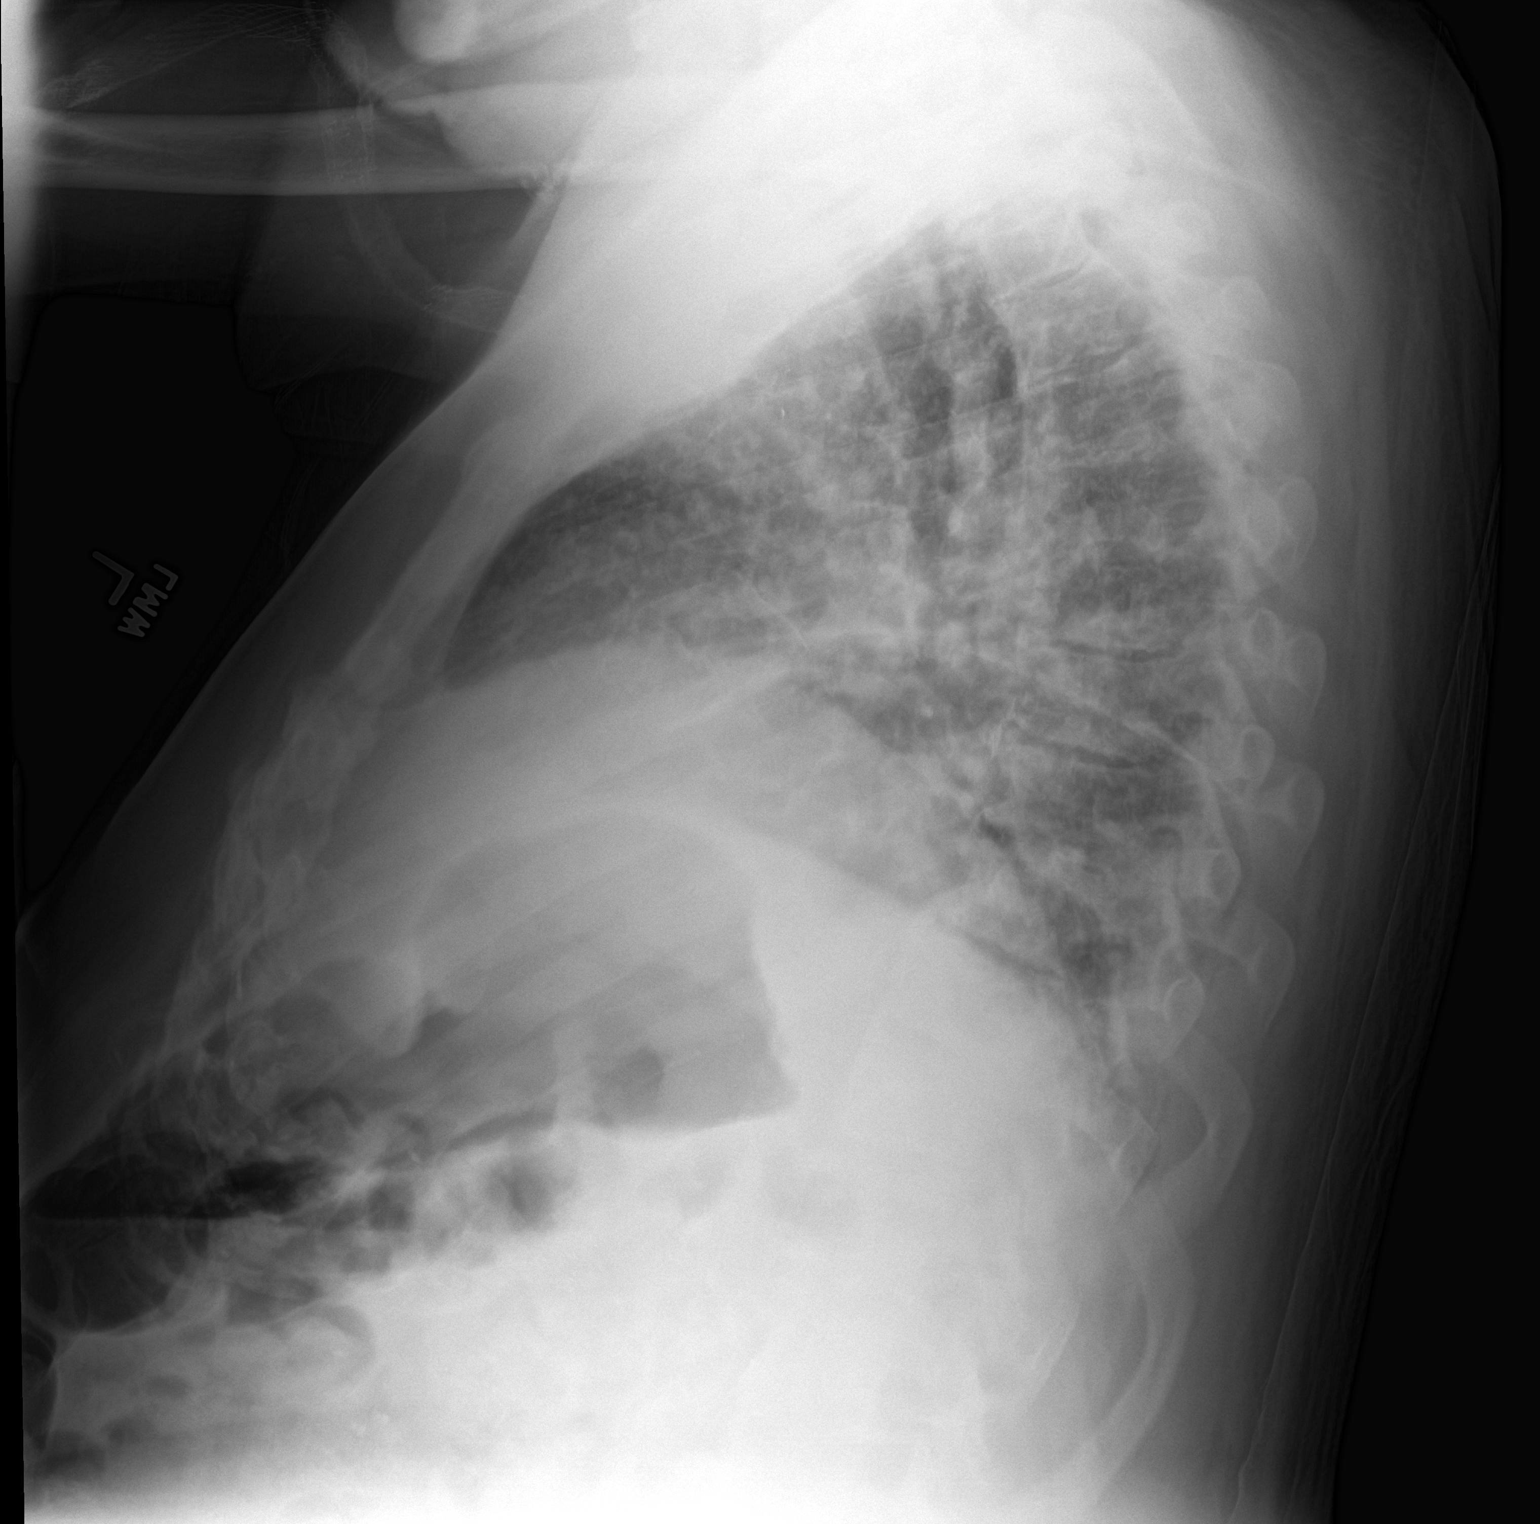

[chest ap]
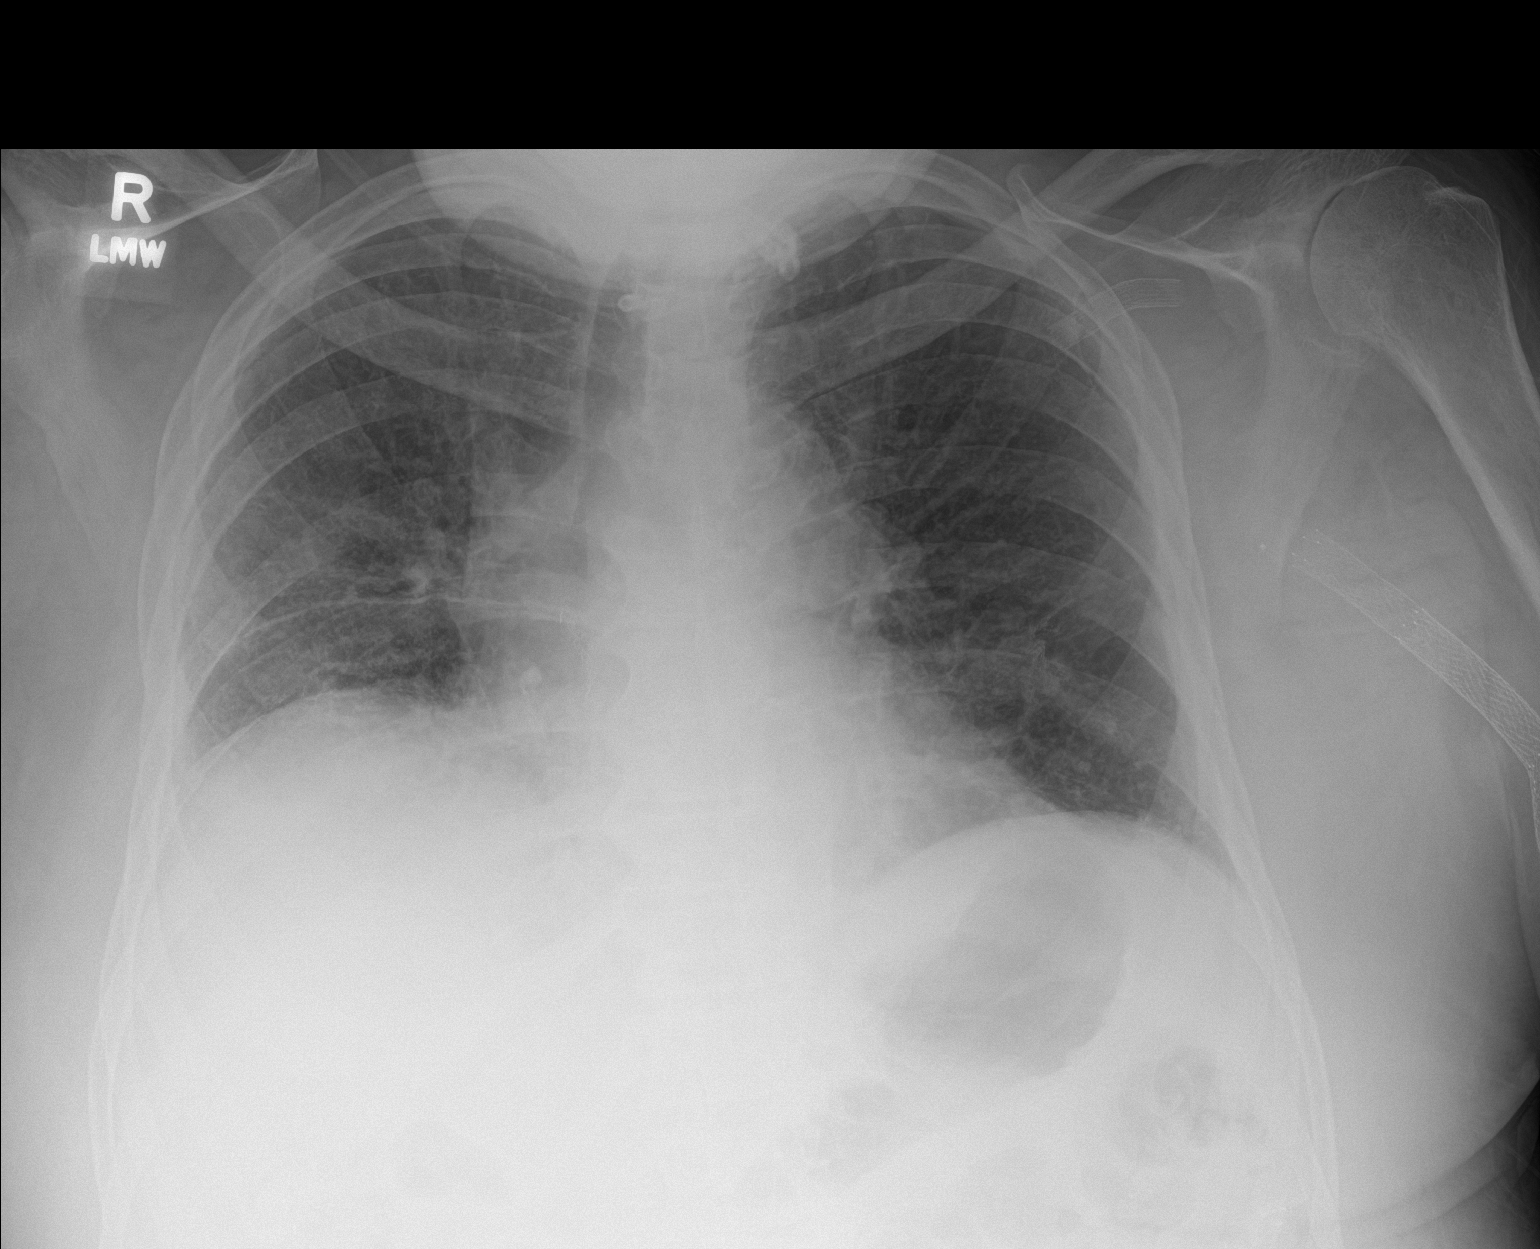

[2 of 2 positions shown; findings below may reference images not displayed]

FINDINGS: Cardiac shadow is within normal limits. The thoracic aorta is
tortuous. The lungs are incompletely aerated. Multiple vascular
stents are noted in the left arm. Elevation the right hemidiaphragm
is again seen. Diffuse interstitial changes are noted without focal
infiltrate. Degenerative change of the thoracic spine is noted.
IMPRESSION: Chronic changes without acute abnormality.

## 2017-02-20 IMAGING — CR DG FOOT COMPLETE 3+V*L*
1 series · 3 of 3 positions shown · non-contrast
Comparison: None.

CLINICAL DATA: Necrotic ulcer of the left heel and history of
diabetes and peripheral vascular disease.

EXAM:
LEFT FOOT - COMPLETE 3+ VIEW

[Series 1: ap · 0.17mm/px · 3 of 3 slices shown]
[im 1/3]
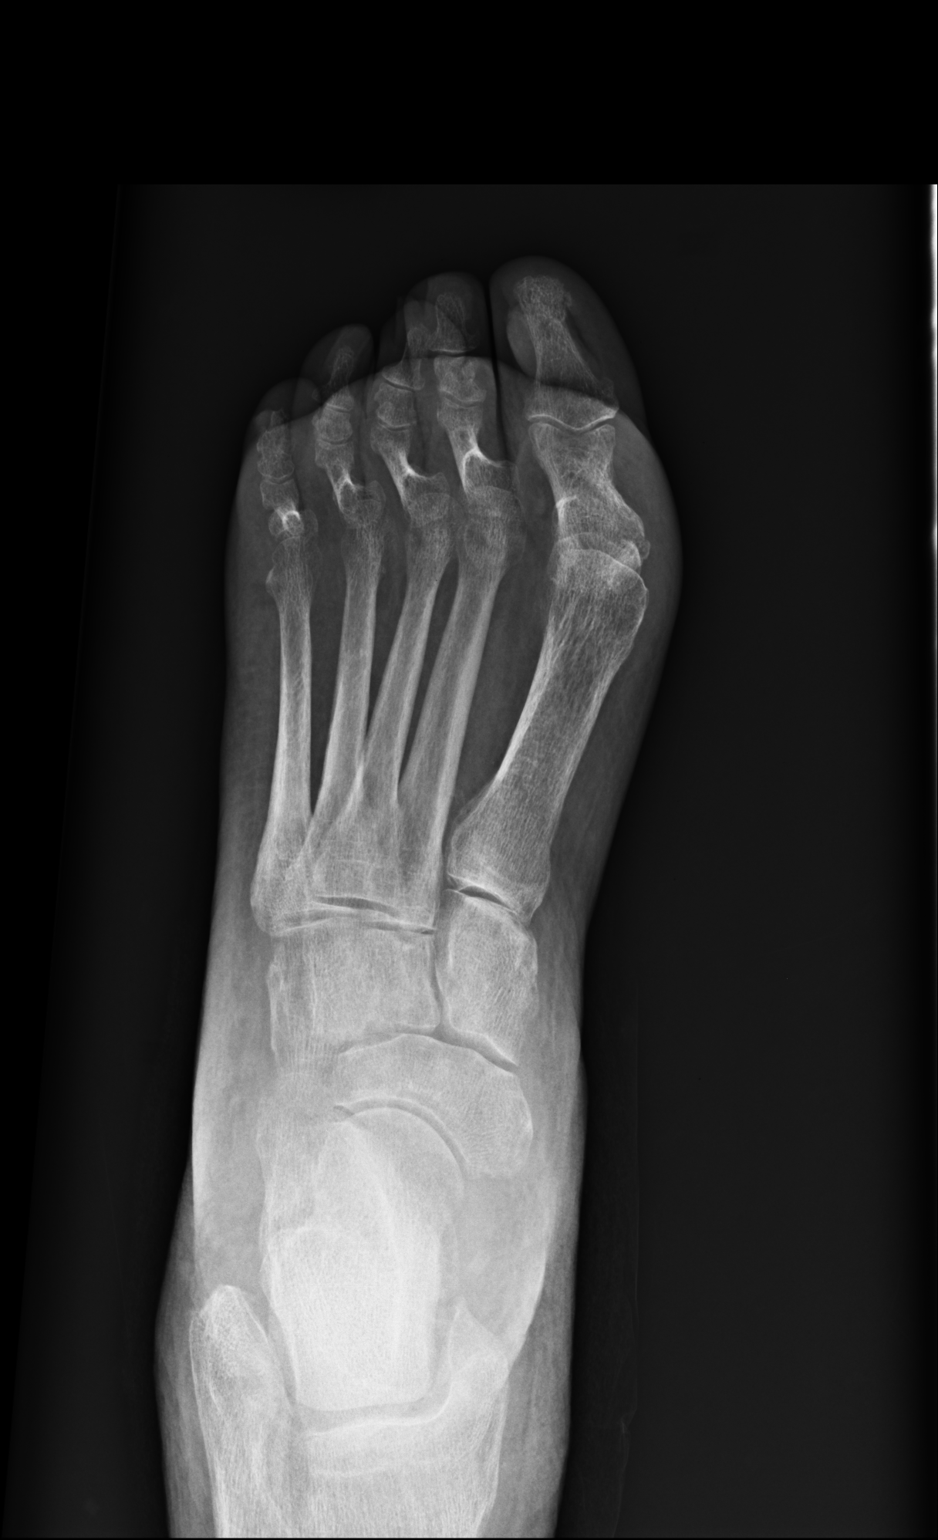
[im 2/3]
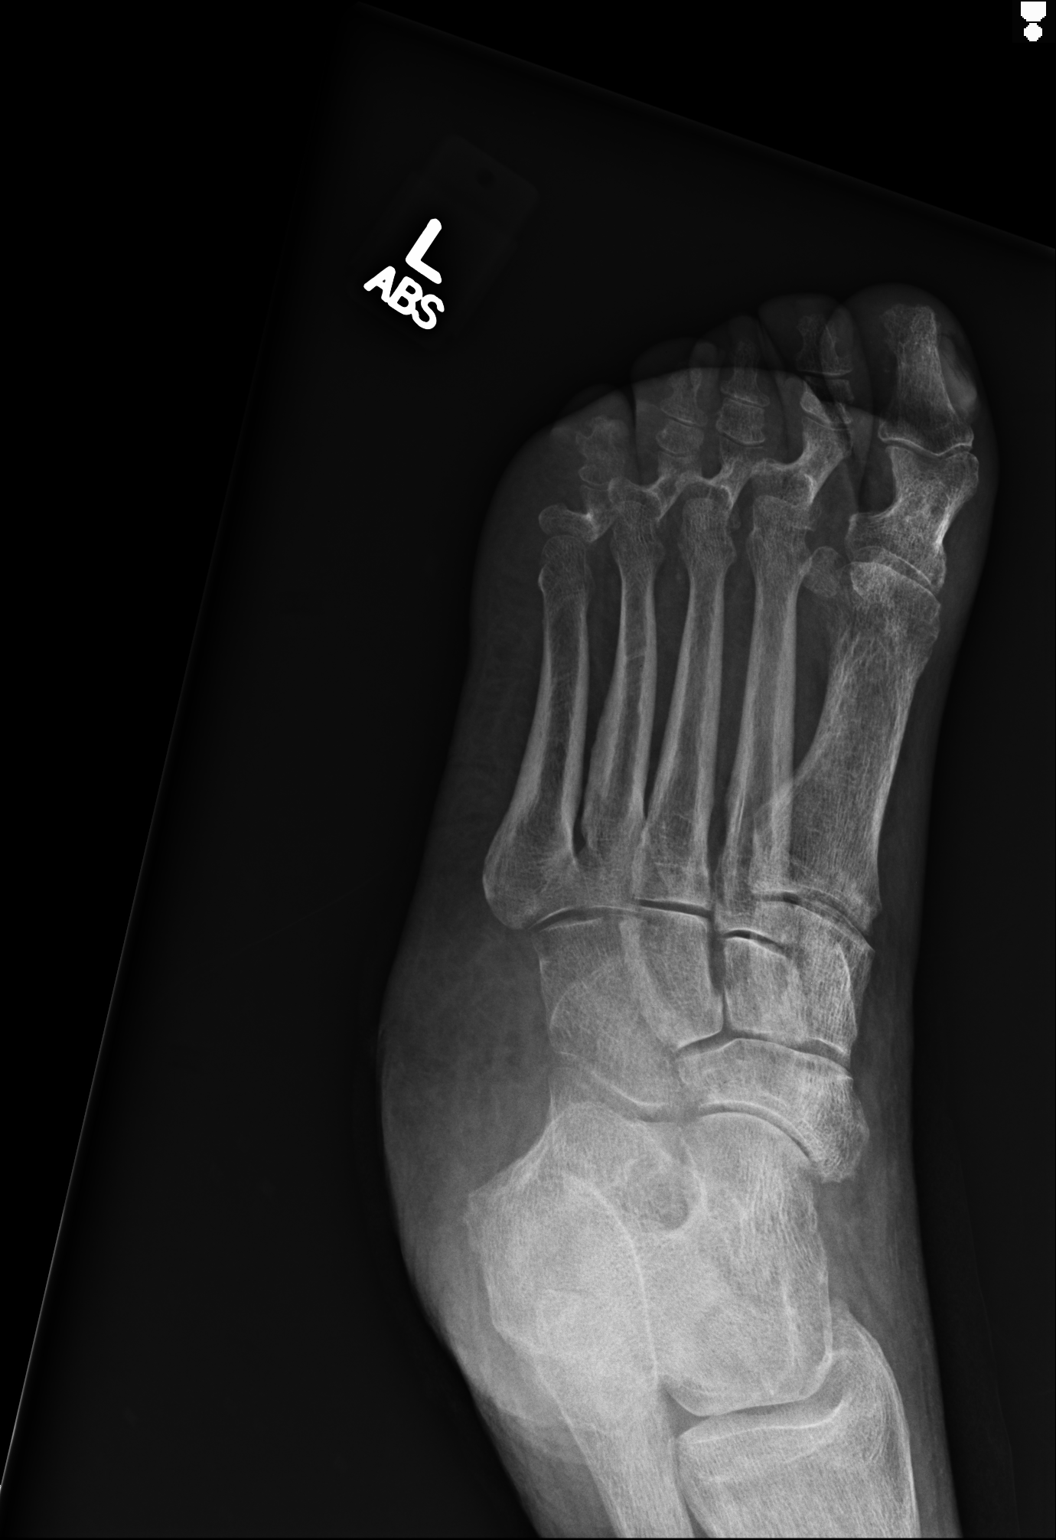
[im 3/3]
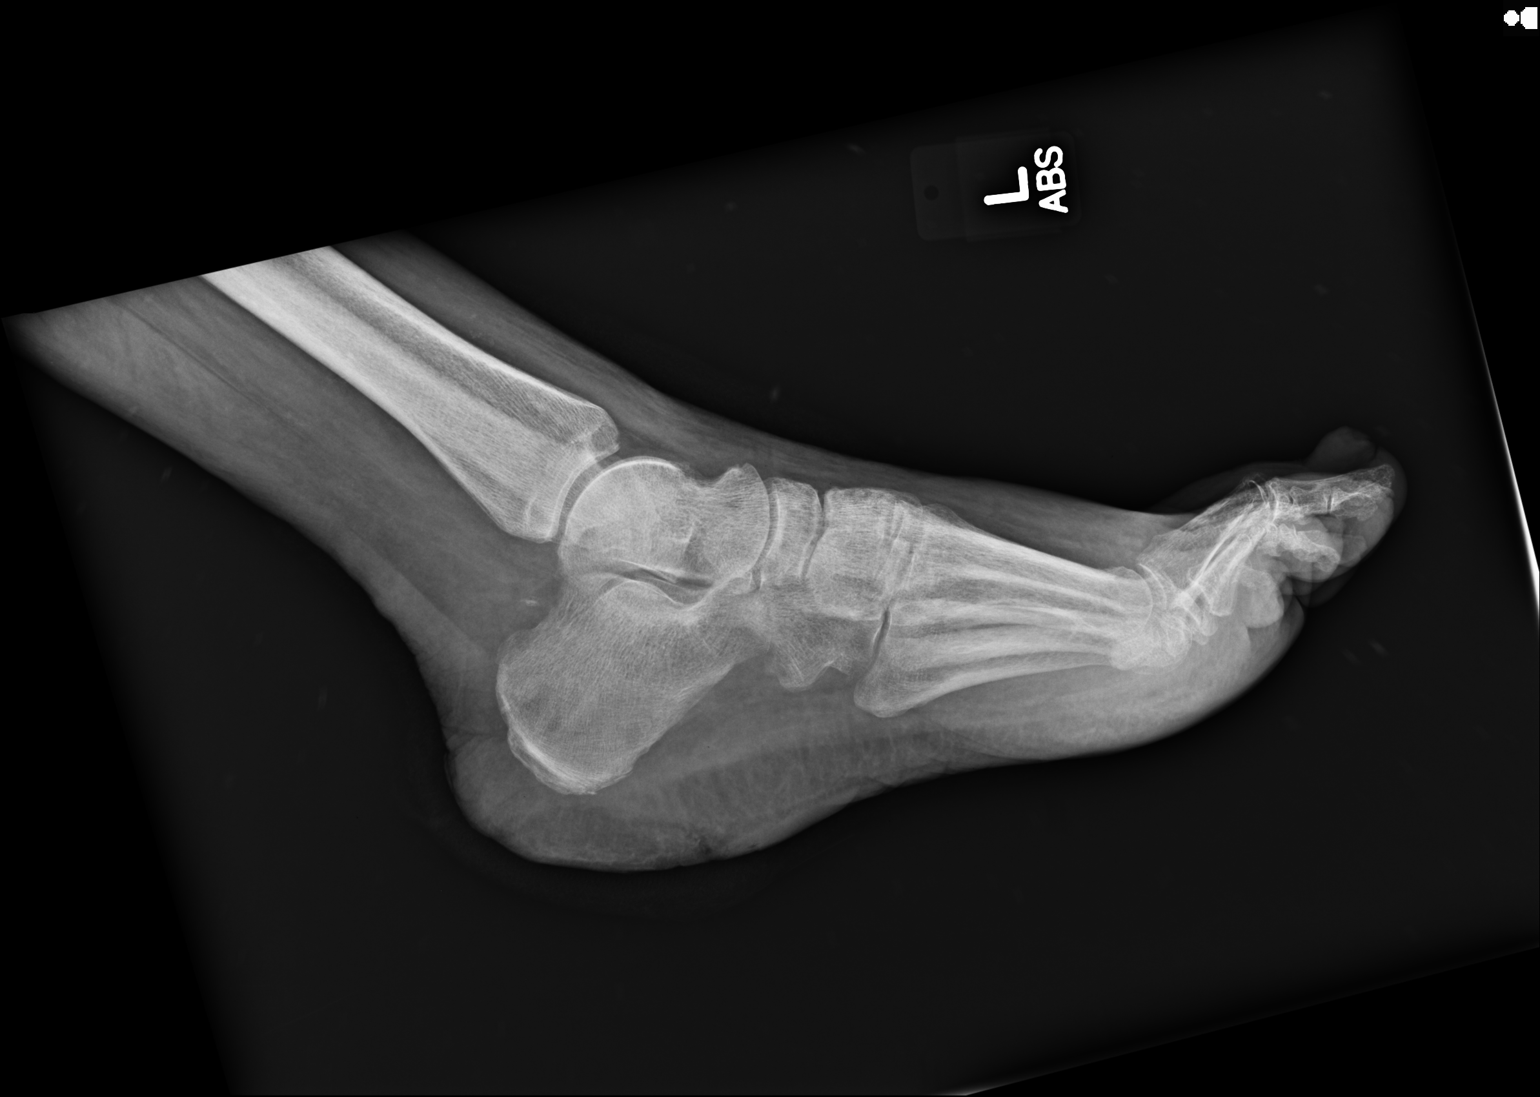

[3 of 3 positions shown; findings below may reference images not displayed]

FINDINGS: Lateral view shows soft tissue defect of the plantar aspect of the
hindfoot without evidence of underlying bony destruction or
fracture. No soft tissue foreign body is seen. Mild osteoarthritis
noted of the toes. No focal bony lesions.
IMPRESSION: Plantar hindfoot soft tissue defect likely represents the clinical
ulcer. No underlying evidence of foreign body or obvious bony
destruction to suggest osteomyelitis.
